# Patient Record
Sex: Female | Born: 1965 | Race: White | Hispanic: No | Marital: Married | Smoking: Never smoker
Health system: Southern US, Community
[De-identification: ages and names within clinical notes are randomized; demographics above are authoritative.]

## PROBLEM LIST (undated history)

## (undated) DIAGNOSIS — R17 Unspecified jaundice: Secondary | ICD-10-CM

## (undated) DIAGNOSIS — K9186 Retained cholelithiasis following cholecystectomy: Secondary | ICD-10-CM

## (undated) DIAGNOSIS — D649 Anemia, unspecified: Secondary | ICD-10-CM

## (undated) DIAGNOSIS — T4145XA Adverse effect of unspecified anesthetic, initial encounter: Secondary | ICD-10-CM

## (undated) DIAGNOSIS — R061 Stridor: Secondary | ICD-10-CM

## (undated) DIAGNOSIS — N920 Excessive and frequent menstruation with regular cycle: Secondary | ICD-10-CM

## (undated) DIAGNOSIS — K8020 Calculus of gallbladder without cholecystitis without obstruction: Secondary | ICD-10-CM

## (undated) DIAGNOSIS — J45909 Unspecified asthma, uncomplicated: Secondary | ICD-10-CM

## (undated) DIAGNOSIS — Z973 Presence of spectacles and contact lenses: Secondary | ICD-10-CM

## (undated) DIAGNOSIS — K219 Gastro-esophageal reflux disease without esophagitis: Secondary | ICD-10-CM

## (undated) DIAGNOSIS — Z9189 Other specified personal risk factors, not elsewhere classified: Secondary | ICD-10-CM

## (undated) DIAGNOSIS — T8859XA Other complications of anesthesia, initial encounter: Secondary | ICD-10-CM

## (undated) SURGERY — Surgical Case
Anesthesia: *Unknown

---

## 2001-10-20 ENCOUNTER — Other Ambulatory Visit: Admission: RE | Admit: 2001-10-20 | Discharge: 2001-10-20 | Payer: Self-pay | Admitting: *Deleted

## 2002-03-17 ENCOUNTER — Inpatient Hospital Stay (HOSPITAL_COMMUNITY): Admission: AD | Admit: 2002-03-17 | Discharge: 2002-03-19 | Payer: Self-pay | Admitting: *Deleted

## 2003-02-09 ENCOUNTER — Other Ambulatory Visit: Admission: RE | Admit: 2003-02-09 | Discharge: 2003-02-09 | Payer: Self-pay | Admitting: *Deleted

## 2005-06-11 ENCOUNTER — Emergency Department (HOSPITAL_COMMUNITY): Admission: EM | Admit: 2005-06-11 | Discharge: 2005-06-12 | Payer: Self-pay | Admitting: Emergency Medicine

## 2007-04-28 ENCOUNTER — Other Ambulatory Visit: Admission: RE | Admit: 2007-04-28 | Discharge: 2007-04-28 | Payer: Self-pay | Admitting: *Deleted

## 2013-02-12 ENCOUNTER — Encounter (HOSPITAL_COMMUNITY): Payer: Self-pay | Admitting: Emergency Medicine

## 2013-02-12 ENCOUNTER — Emergency Department (HOSPITAL_COMMUNITY)
Admission: EM | Admit: 2013-02-12 | Discharge: 2013-02-12 | Disposition: A | Discharge status: Home / Self Care | Payer: Self-pay | Attending: Emergency Medicine | Admitting: Emergency Medicine

## 2013-02-12 ENCOUNTER — Emergency Department (HOSPITAL_COMMUNITY): Payer: Self-pay

## 2013-02-12 DIAGNOSIS — Y9339 Activity, other involving climbing, rappelling and jumping off: Secondary | ICD-10-CM | POA: Insufficient documentation

## 2013-02-12 DIAGNOSIS — W098XXA Fall on or from other playground equipment, initial encounter: Secondary | ICD-10-CM | POA: Insufficient documentation

## 2013-02-12 DIAGNOSIS — Y9239 Other specified sports and athletic area as the place of occurrence of the external cause: Secondary | ICD-10-CM | POA: Insufficient documentation

## 2013-02-12 DIAGNOSIS — X500XXA Overexertion from strenuous movement or load, initial encounter: Secondary | ICD-10-CM | POA: Insufficient documentation

## 2013-02-12 DIAGNOSIS — S93409A Sprain of unspecified ligament of unspecified ankle, initial encounter: Secondary | ICD-10-CM | POA: Insufficient documentation

## 2013-02-12 DIAGNOSIS — J45909 Unspecified asthma, uncomplicated: Secondary | ICD-10-CM | POA: Insufficient documentation

## 2013-02-12 HISTORY — DX: Unspecified asthma, uncomplicated: J45.909

## 2013-02-12 MED ORDER — IBUPROFEN 600 MG PO TABS: 600.0000 mg | ORAL_TABLET | Freq: Four times a day (QID) | ORAL | Status: DC | PRN

## 2013-02-12 MED ORDER — IBUPROFEN 400 MG PO TABS
800.0000 mg | ORAL_TABLET | Freq: Once | ORAL | Status: AC
  Administered 2013-02-12: 800 mg via ORAL
  Filled 2013-02-12: qty 2

## 2013-02-12 NOTE — ED Provider Notes (Signed)
Medical screening examination/treatment/procedure(s) were performed by non-physician practitioner and as supervising physician I was immediately available for consultation/collaboration.  Juliet Rude. Rubin Payor, MD 02/12/13 2239

## 2013-02-12 NOTE — Progress Notes (Signed)
Orthopedic Tech Progress Note Patient Details:  Chelsea Cannon 01/25/1966 [id number]  Ortho Devices Type of Ortho Device: ASO;Crutches Ortho Device/Splint Location: LLE Ortho Device/Splint Interventions: Ordered;Application   [name] 02/12/2013, 4:50 PM

## 2013-02-12 NOTE — ED Provider Notes (Signed)
CSN: [id number]     Arrival date & time 02/12/13  1510 History  This chart was scribed for Marcos Eke, non-physician practitioner working with Juliet Rude. Rubin Payor, MD by Leone Payor, ED Scribe. This patient was seen in room TR09C/TR09C and the patient's care was started at 1510.    Chief Complaint  Patient presents with  . Ankle Pain    The history is provided by the patient. No language interpreter was used.    HPI Comments: Chelsea Cannon is a 47 y.o. female who presents to the Emergency Department complaining of constant, unchanged left ankle pain that started today. Pt states she was playing on a stand up see saw when she jumped off and landed on her left ankle. Pt denies rolling or twisting the affected ankle. She describes the pain as sharp and states the pain radiates from the bottom of heel up to the ankle. Pt has not taken any OTC medications prior to arriving. She denies numbness or weakness. Pt denies smoking and alcohol use.   Past Medical History  Diagnosis Date  . Asthma    History reviewed. No pertinent past surgical history. No family history on file. History  Substance Use Topics  . Smoking status: Never Smoker   . Smokeless tobacco: Not on file  . Alcohol Use: No   OB History   Grav Para Term Preterm Abortions TAB SAB Ect Mult Living                 Review of Systems  Musculoskeletal: Positive for arthralgias.  Skin: Negative for wound.  Neurological: Negative for numbness.  All other systems reviewed and are negative.   Allergies  Review of patient's allergies indicates no known allergies.  Home Medications   Current Outpatient Rx  Name  Route  Sig  Dispense  Refill  . Naproxen Sodium (ALEVE PO)   Oral   Take 1 tablet by mouth daily as needed (pain).         Marland Kitchen ibuprofen (ADVIL,MOTRIN) 600 MG tablet   Oral   Take 1 tablet (600 mg total) by mouth every 6 (six) hours as needed for pain.   30 tablet   0    BP 124/69  Pulse 82   Temp(Src) 98.2 F (36.8 C) (Oral)  Resp 16  Ht 5\' 4"  (1.626 m)  Wt 140 lb (63.504 kg)  BMI 24.02 kg/m2  SpO2 99%  LMP 02/04/2013  Physical Exam  Nursing note and vitals reviewed. Constitutional: She is oriented to person, place, and time. She appears well-developed and well-nourished. No distress.  HENT:  Head: Normocephalic and atraumatic.  Eyes: Conjunctivae and EOM are normal. No scleral icterus.  Neck: Normal range of motion.  Cardiovascular: Normal rate, regular rhythm and intact distal pulses.   2+ DP and PT pulses.   Pulmonary/Chest: Effort normal. No respiratory distress.  Musculoskeletal: Normal range of motion.       Left ankle: She exhibits normal range of motion, no swelling, no ecchymosis, no deformity and normal pulse. Tenderness. Achilles tendon normal.       Left lower leg: Normal.       Left foot: Normal.  Neurological: She is alert and oriented to person, place, and time. She has normal reflexes. She displays normal reflexes.  DTRs are symmetric and normal. Sensation in left lower extremity is intact.    Skin: Skin is warm and dry. No rash noted. She is not diaphoretic. No erythema. No pallor.  Cap refill normal.  Psychiatric: She has a normal mood and affect. Her behavior is normal.    ED Course   Procedures (including critical care time)  DIAGNOSTIC STUDIES: Oxygen Saturation is 99% on Ra, normal by my interpretation.    COORDINATION OF CARE: 4:22 PM Discussed treatment plan with pt at bedside and pt agreed to plan.   Labs Reviewed - No data to display Dg Ankle Complete Left  02/12/2013   *RADIOLOGY REPORT*  Clinical Data: Twisting injury to the left ankle.  Lateral pain.  LEFT ANKLE COMPLETE - 3+ VIEW  Comparison: None.  Findings: No evidence of acute fracture or dislocation.  Ankle mortise intact with well-preserved joint space.  Well-preserved bone mineral density.  No intrinsic osseous abnormality.  Probable joint effusion.  IMPRESSION: No osseous  abnormality.   Original Report Authenticated By: Hulan Saas, M.D.   1. Ankle sprain and strain, left, initial encounter    MDM  Uncomplicated left ankle sprain secondary to fall off seesaw. Patient is neurovascularly intact. Physical exam findings as above. There is no pallor, pulselessness, poikilothermia, or paresthesias to suspect complicating injury. No swelling, erythema, or heat-to-touch to suspect underlying cellulitic or infectious joint process. X-ray without evidence of fracture, dislocation, or other acute bony abnormality. ASO ankle applied in ED and crutches provided for weightbearing as tolerated. RICE instruction given and ibuprofen advised for pain. Patient appropriate for discharge with orthopedic referral should symptoms not improve with recommended treatments. Indications for ED return provided and patient agreeable to plan.  I personally performed the services described in this documentation, which was scribed in my presence. The recorded information has been reviewed and is accurate.    Antony Madura, PA-C 02/12/13 1629

## 2013-02-12 NOTE — ED Notes (Signed)
Pt reports that she was playing on a stand up see saw with her daughter and when she jumped off she landed on her left ankle wrong.

## 2016-03-20 ENCOUNTER — Encounter (HOSPITAL_COMMUNITY): Payer: Self-pay | Admitting: Emergency Medicine

## 2016-03-20 ENCOUNTER — Observation Stay (HOSPITAL_COMMUNITY): Payer: Medicaid Other

## 2016-03-20 ENCOUNTER — Inpatient Hospital Stay (HOSPITAL_COMMUNITY)
Admission: EM | Admit: 2016-03-20 | Discharge: 2016-03-24 | DRG: 419 | Disposition: A | Discharge status: Home / Self Care | Payer: Medicaid Other | Attending: Internal Medicine | Admitting: Internal Medicine

## 2016-03-20 ENCOUNTER — Emergency Department (HOSPITAL_COMMUNITY): Payer: Medicaid Other

## 2016-03-20 ENCOUNTER — Ambulatory Visit (HOSPITAL_COMMUNITY): Payer: Medicaid Other

## 2016-03-20 DIAGNOSIS — K8000 Calculus of gallbladder with acute cholecystitis without obstruction: Principal | ICD-10-CM | POA: Diagnosis present

## 2016-03-20 DIAGNOSIS — R1110 Vomiting, unspecified: Secondary | ICD-10-CM

## 2016-03-20 DIAGNOSIS — D1803 Hemangioma of intra-abdominal structures: Secondary | ICD-10-CM | POA: Diagnosis present

## 2016-03-20 DIAGNOSIS — R1013 Epigastric pain: Secondary | ICD-10-CM | POA: Diagnosis present

## 2016-03-20 DIAGNOSIS — K760 Fatty (change of) liver, not elsewhere classified: Secondary | ICD-10-CM | POA: Diagnosis present

## 2016-03-20 DIAGNOSIS — Z801 Family history of malignant neoplasm of trachea, bronchus and lung: Secondary | ICD-10-CM

## 2016-03-20 DIAGNOSIS — K8020 Calculus of gallbladder without cholecystitis without obstruction: Secondary | ICD-10-CM | POA: Diagnosis present

## 2016-03-20 DIAGNOSIS — Z8249 Family history of ischemic heart disease and other diseases of the circulatory system: Secondary | ICD-10-CM

## 2016-03-20 DIAGNOSIS — Z833 Family history of diabetes mellitus: Secondary | ICD-10-CM

## 2016-03-20 DIAGNOSIS — K838 Other specified diseases of biliary tract: Secondary | ICD-10-CM | POA: Diagnosis present

## 2016-03-20 DIAGNOSIS — D72829 Elevated white blood cell count, unspecified: Secondary | ICD-10-CM | POA: Diagnosis present

## 2016-03-20 DIAGNOSIS — R112 Nausea with vomiting, unspecified: Secondary | ICD-10-CM

## 2016-03-20 DIAGNOSIS — K819 Cholecystitis, unspecified: Secondary | ICD-10-CM

## 2016-03-20 DIAGNOSIS — K8021 Calculus of gallbladder without cholecystitis with obstruction: Secondary | ICD-10-CM

## 2016-03-20 LAB — I-STAT TROPONIN, ED
TROPONIN I, POC: 0 ng/mL (ref 0.00–0.08)
TROPONIN I, POC: 0 ng/mL (ref 0.00–0.08)

## 2016-03-20 LAB — COMPREHENSIVE METABOLIC PANEL
ALK PHOS: 100 U/L (ref 38–126)
ALT: 17 U/L (ref 14–54)
ANION GAP: 10 (ref 5–15)
AST: 25 U/L (ref 15–41)
Albumin: 4.3 g/dL (ref 3.5–5.0)
BUN: 12 mg/dL (ref 6–20)
CALCIUM: 9.5 mg/dL (ref 8.9–10.3)
CO2: 20 mmol/L — AB (ref 22–32)
Chloride: 105 mmol/L (ref 101–111)
Creatinine, Ser: 0.82 mg/dL (ref 0.44–1.00)
GFR calc non Af Amer: >60 mL/min (ref >60)
Glucose, Bld: 117 mg/dL — ABNORMAL HIGH (ref 65–99)
POTASSIUM: 4.7 mmol/L (ref 3.5–5.1)
SODIUM: 135 mmol/L (ref 135–145)
TOTAL PROTEIN: 7.9 g/dL (ref 6.5–8.1)
Total Bilirubin: 1.1 mg/dL (ref 0.3–1.2)

## 2016-03-20 LAB — LIPASE, BLOOD: Lipase: 16 U/L (ref 11–51)

## 2016-03-20 LAB — CBC
HCT: 41.6 % (ref 36.0–46.0)
Hemoglobin: 13.5 g/dL (ref 12.0–15.0)
MCH: 27.7 pg (ref 26.0–34.0)
MCHC: 32.5 g/dL (ref 30.0–36.0)
MCV: 85.2 fL (ref 78.0–100.0)
PLATELETS: 411 10*3/uL — AB (ref 150–400)
RBC: 4.88 MIL/uL (ref 3.87–5.11)
RDW: 13.5 % (ref 11.5–15.5)
WBC: 14.7 10*3/uL — AB (ref 4.0–10.5)

## 2016-03-20 LAB — POC URINE PREG, ED: PREG TEST UR: NEGATIVE

## 2016-03-20 MED ORDER — FAMOTIDINE IN NACL 20-0.9 MG/50ML-% IV SOLN
20.0000 mg | Freq: Two times a day (BID) | INTRAVENOUS | Status: DC
  Administered 2016-03-20 – 2016-03-22 (×5): 20 mg via INTRAVENOUS
  Filled 2016-03-20 (×7): qty 50

## 2016-03-20 MED ORDER — ALUM & MAG HYDROXIDE-SIMETH 200-200-20 MG/5ML PO SUSP
30.0000 mL | Freq: Once | ORAL | Status: AC
  Administered 2016-03-20: 30 mL via ORAL
  Filled 2016-03-20: qty 30

## 2016-03-20 MED ORDER — SODIUM CHLORIDE 0.9 % IV BOLUS (SEPSIS)
1000.0000 mL | Freq: Once | INTRAVENOUS | Status: AC
  Administered 2016-03-20: 1000 mL via INTRAVENOUS

## 2016-03-20 MED ORDER — PIPERACILLIN-TAZOBACTAM 3.375 G IVPB
3.3750 g | Freq: Three times a day (TID) | INTRAVENOUS | Status: DC
  Administered 2016-03-21 – 2016-03-23 (×8): 3.375 g via INTRAVENOUS
  Filled 2016-03-20 (×9): qty 50

## 2016-03-20 MED ORDER — ENOXAPARIN SODIUM 40 MG/0.4ML ~~LOC~~ SOLN
40.0000 mg | SUBCUTANEOUS | Status: DC
  Administered 2016-03-20: 40 mg via SUBCUTANEOUS
  Filled 2016-03-20: qty 0.4

## 2016-03-20 MED ORDER — PROMETHAZINE HCL 25 MG/ML IJ SOLN: 25.0000 mg | Freq: Four times a day (QID) | INTRAMUSCULAR | Status: DC | PRN

## 2016-03-20 MED ORDER — ONDANSETRON HCL 4 MG/2ML IJ SOLN
4.0000 mg | Freq: Four times a day (QID) | INTRAMUSCULAR | Status: DC | PRN
  Administered 2016-03-22: 4 mg via INTRAVENOUS
  Filled 2016-03-20: qty 2

## 2016-03-20 MED ORDER — METOCLOPRAMIDE HCL 5 MG/ML IJ SOLN
10.0000 mg | Freq: Once | INTRAMUSCULAR | Status: AC
  Administered 2016-03-20: 10 mg via INTRAVENOUS
  Filled 2016-03-20: qty 2

## 2016-03-20 MED ORDER — ACETAMINOPHEN 650 MG RE SUPP: 650.0000 mg | Freq: Four times a day (QID) | RECTAL | Status: DC | PRN

## 2016-03-20 MED ORDER — GADOBENATE DIMEGLUMINE 529 MG/ML IV SOLN
14.0000 mL | Freq: Once | INTRAVENOUS | Status: AC | PRN
  Administered 2016-03-20: 14 mL via INTRAVENOUS

## 2016-03-20 MED ORDER — PIPERACILLIN-TAZOBACTAM 3.375 G IVPB 30 MIN
3.3750 g | Freq: Once | INTRAVENOUS | Status: AC
  Administered 2016-03-20: 3.375 g via INTRAVENOUS
  Filled 2016-03-20: qty 50

## 2016-03-20 MED ORDER — KETOROLAC TROMETHAMINE 15 MG/ML IJ SOLN
15.0000 mg | Freq: Four times a day (QID) | INTRAMUSCULAR | Status: DC | PRN
  Administered 2016-03-21 – 2016-03-22 (×3): 15 mg via INTRAVENOUS
  Filled 2016-03-20 (×3): qty 1

## 2016-03-20 MED ORDER — ACETAMINOPHEN 325 MG PO TABS
650.0000 mg | ORAL_TABLET | Freq: Four times a day (QID) | ORAL | Status: DC | PRN
  Administered 2016-03-23: 650 mg via ORAL
  Filled 2016-03-20: qty 2

## 2016-03-20 MED ORDER — MORPHINE SULFATE (PF) 2 MG/ML IV SOLN: 1.0000 mg | INTRAVENOUS | Status: DC | PRN

## 2016-03-20 MED ORDER — SODIUM CHLORIDE 0.9 % IV BOLUS (SEPSIS)
500.0000 mL | Freq: Once | INTRAVENOUS | Status: AC
  Administered 2016-03-20: 500 mL via INTRAVENOUS

## 2016-03-20 MED ORDER — FENTANYL CITRATE (PF) 100 MCG/2ML IJ SOLN
50.0000 ug | Freq: Once | INTRAMUSCULAR | Status: AC
  Administered 2016-03-20: 50 ug via INTRAVENOUS
  Filled 2016-03-20: qty 2

## 2016-03-20 MED ORDER — ONDANSETRON HCL 4 MG/2ML IJ SOLN
4.0000 mg | Freq: Once | INTRAMUSCULAR | Status: AC
  Administered 2016-03-20: 4 mg via INTRAVENOUS
  Filled 2016-03-20: qty 2

## 2016-03-20 MED ORDER — SODIUM CHLORIDE 0.9 % IV SOLN
INTRAVENOUS | Status: DC
  Administered 2016-03-20 – 2016-03-23 (×4): via INTRAVENOUS

## 2016-03-20 NOTE — ED Notes (Signed)
MD at bedside. 

## 2016-03-20 NOTE — ED Notes (Signed)
Patient transported to MRI 

## 2016-03-20 NOTE — ED Notes (Signed)
Patient transported to Ultrasound 

## 2016-03-20 NOTE — ED Notes (Signed)
Phlebotomy at bedside.

## 2016-03-20 NOTE — ED Triage Notes (Signed)
Pt reports left sided chest pain that began Wednesday morning at 130 AM. Pt reports radiation of pain and diaophoresis with the pain. Denies history of heart disease. VSS.

## 2016-03-20 NOTE — ED Notes (Signed)
IV attempted will re-attempt.

## 2016-03-20 NOTE — ED Notes (Signed)
Patient transported to X-ray 

## 2016-03-20 NOTE — H&P (Signed)
History and Physical    Chelsea Cannon [id number] DOB: [birth year] DOA: 03/20/2016   PCP: No PCP Per Patient / UNASSIGNED  Patient coming from/Resides with: Private residence/this with husband and children  Admission status: Observation/medical floor  Chief Complaint: Epigastric pain  HPI: Chelsea Cannon is a 49 y.o. female without any significant medical history. Patient reports that for the past several days she has noted epigastric pain with eating which worsened yesterday. She felt like these symptoms represented very severe indigestion. Because of this she was afraid to eat. She awakened at 1:30 AM this morning with severe epigastric pain radiating to her left back and shoulder blade region. This pain was constant and very severe in nature level 9-10/10. This was associated with significant nausea and recurrent emesis. She has not noticed any dark urine. She had noticed increase in abdominal pain with eating over the past several days. Patient currently reporting very minimal to 0 abdominal pain.  ED Course:  Vital Signs: BP 121/71 (BP Location: Right Arm)   Pulse 67   Temp 98.7 F (37.1 C) (Oral)   Resp 21   LMP 02/28/2016   SpO2 97%  2 view CXR: No active cardiopulmonary disease Ultrasound abdomen Limited RUQ: Dilated common bile duct measuring 13.9 cm, assessment, and bile duct limited due to shadowing from overlying gallstones (also associated gallbladder sludge) therefore unable to exclude a distal common bile duct calculus, stricture or mass-MRCP recommended to evaluate further; incidental finding of mild fatty infiltration of the liver Lab data: Sodium 135, potassium 4.7, CO2 20, BUN 12, creatinine 0.82, glucose 117, LFTs are normal, poc troponin negative 2, white count 14,700 differential not obtained, hemoglobin 13.5, platelets 411,000, urine pregnancy test negative Medications and treatments: Mylanta 30 mL 1, normal saline bolus 1500 mL, Zofran 4 mg IV 1,  fentanyl 50 g IV 1  Review of Systems:  In addition to the HPI above,  No Fever-chills, myalgias or other constitutional symptoms No Headache, changes with Vision or hearing, new weakness, tingling, numbness in any extremity, dizziness, dysarthria or word finding difficulty, gait disturbance or imbalance, tremors or seizure activity No problems swallowing food or Liquids, choking or coughing while eating No Chest pain, Cough or Shortness of Breath, palpitations, orthopnea or DOE No melena,hematochezia, dark tarry stools, constipation No dysuria, malodorous urine, hematuria or flank pain No new skin rashes, lesions, masses or bruises, No new joint pains, aches, swelling or redness No recent unintentional weight gain or loss No polyuria, polydypsia or polyphagia   Past Medical History:  Diagnosis Date  . Asthma     History reviewed. No pertinent surgical history.  Social History   Social History  . Marital status: Married    Spouse name: N/A  . Number of children: N/A  . Years of education: N/A   Occupational History  . Not on file.   Social History Main Topics  . Smoking status: Never Smoker  . Smokeless tobacco: Not on file  . Alcohol use No  . Drug use: No  . Sexual activity: Not on file   Other Topics Concern  . Not on file   Social History Narrative  . No narrative on file    Mobility: Without assistive devices Work history: Housewife   No Known Allergies  Family History:  Father: Diabetes mellitus, hypertension, CAD and lung cancer   Prior to Admission medications   Medication Sig Start Date End Date Taking? Authorizing Provider  Famotidine (PEPCID PO) Take 2 tablets by mouth  daily as needed (heartburn).   Yes Historical Provider, MD  ranitidine (ZANTAC) 150 MG tablet Take 150 mg by mouth daily as needed for heartburn.   Yes Historical Provider, MD  ibuprofen (ADVIL,MOTRIN) 600 MG tablet Take 1 tablet (600 mg total) by mouth every 6 (six) hours as  needed for pain. Patient not taking: Reported on 03/20/2016 02/12/13   Antonietta Breach, PA-C    Physical Exam: Vitals:   03/20/16 1200 03/20/16 1430 03/20/16 1609 03/20/16 1643  BP: 122/75 110/72 125/65 121/71  Pulse: 86 75 84 67  Resp: 20 17 15 21   Temp:      TempSrc:      SpO2: 98% 100% 98% 97%      Constitutional: NAD, calm, comfortable Eyes: PERRL, lids and conjunctivae normal ENMT: Mucous membranes are moist. Posterior pharynx clear of any exudate or lesions.Normal dentition.  Neck: normal, supple, no masses, no thyromegaly Respiratory: clear to auscultation bilaterally, no wheezing, no crackles. Normal respiratory effort. No accessory muscle use.  Cardiovascular: Regular rate and rhythm, no murmurs / rubs / gallops. No extremity edema. 2+ pedal pulses. No carotid bruits.  Abdomen: Minimally tender over epigastrium, no masses palpated. No hepatosplenomegaly. Bowel sounds positive.  Musculoskeletal: no clubbing / cyanosis. No joint deformity upper and lower extremities. Good ROM, no contractures. Normal muscle tone.  Skin: no rashes, lesions, ulcers. No induration Neurologic: CN 2-12 grossly intact. Sensation intact, DTR normal. Strength 5/5 x all 4 extremities.  Psychiatric: Normal judgment and insight. Alert and oriented x 3. Normal mood.    Labs on Admission: I have personally reviewed following labs and imaging studies  CBC:  Recent Labs Lab 03/20/16 1038  WBC 14.7*  HGB 13.5  HCT 41.6  MCV 85.2  PLT 123456*   Basic Metabolic Panel:  Recent Labs Lab 03/20/16 1028  NA 135  K 4.7  CL 105  CO2 20*  GLUCOSE 117*  BUN 12  CREATININE 0.82  CALCIUM 9.5   GFR: CrCl cannot be calculated (Unknown ideal weight.). Liver Function Tests:  Recent Labs Lab 03/20/16 1028  AST 25  ALT 17  ALKPHOS 100  BILITOT 1.1  PROT 7.9  ALBUMIN 4.3    Recent Labs Lab 03/20/16 1028  LIPASE 16   No results for input(s): AMMONIA in the last 168 hours. Coagulation  Profile: No results for input(s): INR, PROTIME in the last 168 hours. Cardiac Enzymes: No results for input(s): CKTOTAL, CKMB, CKMBINDEX, TROPONINI in the last 168 hours. BNP (last 3 results) No results for input(s): PROBNP in the last 8760 hours. HbA1C: No results for input(s): HGBA1C in the last 72 hours. CBG: No results for input(s): GLUCAP in the last 168 hours. Lipid Profile: No results for input(s): CHOL, HDL, LDLCALC, TRIG, CHOLHDL, LDLDIRECT in the last 72 hours. Thyroid Function Tests: No results for input(s): TSH, T4TOTAL, FREET4, T3FREE, THYROIDAB in the last 72 hours. Anemia Panel: No results for input(s): VITAMINB12, FOLATE, FERRITIN, TIBC, IRON, RETICCTPCT in the last 72 hours. Urine analysis: No results found for: COLORURINE, APPEARANCEUR, LABSPEC, PHURINE, GLUCOSEU, HGBUR, BILIRUBINUR, KETONESUR, PROTEINUR, UROBILINOGEN, NITRITE, LEUKOCYTESUR Sepsis Labs: @LABRCNTIP (procalcitonin:4,lacticidven:4) )No results found for this or any previous visit (from the past 240 hour(s)).   Radiological Exams on Admission: Dg Chest 2 View  Result Date: 03/20/2016 CLINICAL DATA:  Chest pain since this morning. EXAM: CHEST  2 VIEW COMPARISON:  None. FINDINGS: The heart size and mediastinal contours are within normal limits. Both lungs are clear. The visualized skeletal structures are unremarkable. IMPRESSION: No  active cardiopulmonary disease. Electronically Signed   By: Marin Olp M.D.   On: 03/20/2016 11:13   US Abdomen Limited Ruq  Result Date: 03/20/2016 CLINICAL DATA:  Epigastric pain EXAM: US ABDOMEN LIMITED - RIGHT UPPER QUADRANT COMPARISON:  None. FINDINGS: Gallbladder: The gallbladder is well seen and there are gallstones present the largest of 1.4 cm with some gallbladder sludge present as well. There is no pain over the gallbladder with compression. Common bile duct: Diameter: The common bile duct is dilated measuring 13.9 cm. Assessment of the common bile duct is limited due  to shadowing from overlying gallstones. A distal common bowel duct calculus, stricture, or mass is suspected in view of the dilatation of the common bile duct. Consider CT or preferably MR with MRCP to assess further. Liver: The liver somewhat echogenic suggesting mild fatty infiltration. No focal hepatic abnormality is seen. IMPRESSION: 1. Dilated common bile duct. Consider distal common bile duct calculus, stricture, or mass. Recommend CT or preferably MRI with MRCP to evaluate further. 2. Gallstones with gallbladder sludge. 3. Suspect mild fatty infiltration of the liver. Electronically Signed   By: Ivar Drape M.D.   On: 03/20/2016 16:17    EKG: (Independently reviewed) normal sinus rhythm with ventricular rate 84 bpm, QTC 450 ms, no ischemic changes  Assessment/Plan Principal Problem:   Common bile duct dilatation -History appears consistent with passage of large gallstone but needs MRCP to better clarify ie gallstones or stricture versus mass -Await GI recommendations -LFTs and lipase currently normal -NPO (icechips only) IV fluids until after MRCP and after GI has evaluated  Active Problems:   Acute epigastric pain/Cholelithiasis without cholecystitis -Multiple gallstones with sludge without evidence of acute cholecystitis on ultrasound -Symptom management with IV Toradol and for severe pain IV morphine -IV Pepcid while giving IV Toradol -Follow labs  -Will need eventual cholecystectomy    Fatty liver disease, nonalcoholic -Incidental finding on abdominal ultrasound and very mild in nature -Current LFTs are normal    Leukocytosis -Currently without fever but given possibility of recent passage of common bile duct stone will treat empirically for gram-negative infection with IV Zosyn -Follow labs      DVT prophylaxis: Lovenox  Code Status: Full Family Communication: Teenage daughter at bedside  Disposition Plan: Anticipate discharge back to preadmission home in [place] once  medically stable Consults called: Gastroenterology on-call per EDP Dr. Leonette Monarch [facility] on unassigned call for 9/28)    Carlis Burnsworth L. ANP-BC [facility] Pager [phone]   If 7PM-7AM, please contact night-coverage [email or web address] Password [email or web address]  03/20/2016, 5:10 PM

## 2016-03-20 NOTE — Progress Notes (Signed)
Pharmacy Antibiotic Note  Chelsea Cannon is a 50 y.o. female admitted on 03/20/2016 with possible intra-abdominal infection. Patient reporting abdominal pain with nausea and vomiting. Gallstones on ultrasound per notes. Pharmacy has been consulted for Zosyn dosing. Afebrile and WBC elevated at 14.7.   Plan: Zosyn 3.375g IV q8h (4 hour infusion). Monitor renal function, clinical picture, and culture results.  Follow-up antibiotic duration of therapy.   Temp (24hrs), Avg:98.7 F (37.1 C), Min:98.7 F (37.1 C), Max:98.7 F (37.1 C)   Recent Labs Lab 03/20/16 1028 03/20/16 1038  WBC  --  14.7*  CREATININE 0.82  --     CrCl ~ 80-90 mL/min   No Known Allergies  Antimicrobials this admission: 9/28 Zosyn >>   Dose adjustments this admission: N/A   Microbiology results: pending   Thank you for allowing pharmacy to be a part of this patient's care.  Argie Ramming, PharmD Pharmacy Resident  Pager [phone] 03/20/16 5:46 PM

## 2016-03-20 NOTE — ED Notes (Signed)
Lab at bedside

## 2016-03-20 NOTE — ED Provider Notes (Signed)
[facility] DEPT Provider Note   CSN: [id number] Arrival date & time: 03/20/16  [id number]     History   Chief Complaint Chief Complaint  Patient presents with  . Chest Pain    HPI Chelsea Cannon is a 50 y.o. female.  The history is provided by the patient.  Chest Pain   This is a recurrent problem. The current episode started yesterday. The problem occurs constantly. The problem has been gradually worsening. The pain is present in the epigastric region and lateral region. The pain is moderate. The quality of the pain is described as sharp. The pain radiates to the left shoulder and left arm. Associated symptoms include cough, nausea and vomiting. Pertinent negatives include no abdominal pain, no back pain, no fever, no lower extremity edema, no palpitations and no shortness of breath. She has tried antacids for the symptoms. The treatment provided mild relief.  Pertinent negatives for past medical history include no CAD, no diabetes, no DVT, no hyperlipidemia, no hypertension, no MI, no PE and no seizures.  Procedure history is negative for cardiac catheterization and exercise treadmill test.    Past Medical History:  Diagnosis Date  . Asthma     Patient Active Problem List   Diagnosis Date Noted  . Cholelithiasis without cholecystitis 03/20/2016  . Acute epigastric pain 03/20/2016  . Common bile duct dilatation 03/20/2016  . Fatty liver disease, nonalcoholic 0000000  . Leukocytosis 03/20/2016    History reviewed. No pertinent surgical history.  OB History    No data available       Home Medications    Prior to Admission medications   Medication Sig Start Date End Date Taking? Authorizing Provider  Famotidine (PEPCID PO) Take 2 tablets by mouth daily as needed (heartburn).   Yes Historical Provider, MD  ranitidine (ZANTAC) 150 MG tablet Take 150 mg by mouth daily as needed for heartburn.   Yes Historical Provider, MD  ibuprofen (ADVIL,MOTRIN) 600 MG tablet  Take 1 tablet (600 mg total) by mouth every 6 (six) hours as needed for pain. Patient not taking: Reported on 03/20/2016 02/12/13   Antonietta Breach, PA-C    Family History History reviewed. No pertinent family history.  Social History Social History  Substance Use Topics  . Smoking status: Never Smoker  . Smokeless tobacco: Not on file  . Alcohol use No     Allergies   Review of patient's allergies indicates no known allergies.   Review of Systems Review of Systems  Constitutional: Negative for chills and fever.  HENT: Negative for ear pain and sore throat.   Eyes: Negative for pain and visual disturbance.  Respiratory: Positive for cough. Negative for shortness of breath.   Cardiovascular: Positive for chest pain. Negative for palpitations.  Gastrointestinal: Positive for nausea and vomiting. Negative for abdominal pain.  Genitourinary: Negative for dysuria and hematuria.  Musculoskeletal: Negative for arthralgias and back pain.  Skin: Negative for color change and rash.  Neurological: Negative for seizures and syncope.  All other systems reviewed and are negative.    Physical Exam Updated Vital Signs BP 147/79 (BP Location: Right Arm)   Pulse 84   Temp 98.7 F (37.1 C) (Oral)   Resp 16   LMP 02/28/2016   SpO2 100%   Physical Exam  Constitutional: She is oriented to person, place, and time. She appears well-developed and well-nourished. No distress.  HENT:  Head: Normocephalic and atraumatic.  Nose: Nose normal.  Eyes: Conjunctivae and EOM are normal. Pupils are  equal, round, and reactive to light. Right eye exhibits no discharge. Left eye exhibits no discharge. No scleral icterus.  Neck: Normal range of motion. Neck supple.  Cardiovascular: Normal rate and regular rhythm.  Exam reveals no gallop and no friction rub.   No murmur heard. Pulmonary/Chest: Effort normal and breath sounds normal. No stridor. No respiratory distress. She has no rales.  Abdominal: Soft.  She exhibits no distension. There is tenderness in the epigastric area.  Musculoskeletal: She exhibits no edema or tenderness.  Neurological: She is alert and oriented to person, place, and time.  Skin: Skin is warm and dry. No rash noted. She is not diaphoretic. No erythema.  Psychiatric: She has a normal mood and affect.  Vitals reviewed.    ED Treatments / Results  Labs (all labs ordered are listed, but only abnormal results are displayed) Labs Reviewed  CBC - Abnormal; Notable for the following:       Result Value   WBC 14.7 (*)    Platelets 411 (*)    All other components within normal limits  COMPREHENSIVE METABOLIC PANEL - Abnormal; Notable for the following:    CO2 20 (*)    Glucose, Bld 117 (*)    All other components within normal limits  LIPASE, BLOOD  I-STAT TROPOININ, ED  POC URINE PREG, ED  I-STAT TROPOININ, ED    EKG  EKG Interpretation  Date/Time:  Thursday March 20 2016 09:56:19 EDT Ventricular Rate:  84 PR Interval:  140 QRS Duration: 74 QT Interval:  352 QTC Calculation: 415 R Axis:   59 Text Interpretation:  Normal sinus rhythm Right atrial enlargement Abnormal ECG Confirmed by [employer] MD, PEDRO ([id number]) on 03/20/2016 10:24:23 AM       Radiology Dg Chest 2 View  Result Date: 03/20/2016 CLINICAL DATA:  Chest pain since this morning. EXAM: CHEST  2 VIEW COMPARISON:  None. FINDINGS: The heart size and mediastinal contours are within normal limits. Both lungs are clear. The visualized skeletal structures are unremarkable. IMPRESSION: No active cardiopulmonary disease. Electronically Signed   By: Marin Olp M.D.   On: 03/20/2016 11:13   US Abdomen Limited Ruq  Result Date: 03/20/2016 CLINICAL DATA:  Epigastric pain EXAM: US ABDOMEN LIMITED - RIGHT UPPER QUADRANT COMPARISON:  None. FINDINGS: Gallbladder: The gallbladder is well seen and there are gallstones present the largest of 1.4 cm with some gallbladder sludge present as well. There is no pain  over the gallbladder with compression. Common bile duct: Diameter: The common bile duct is dilated measuring 13.9 cm. Assessment of the common bile duct is limited due to shadowing from overlying gallstones. A distal common bowel duct calculus, stricture, or mass is suspected in view of the dilatation of the common bile duct. Consider CT or preferably MR with MRCP to assess further. Liver: The liver somewhat echogenic suggesting mild fatty infiltration. No focal hepatic abnormality is seen. IMPRESSION: 1. Dilated common bile duct. Consider distal common bile duct calculus, stricture, or mass. Recommend CT or preferably MRI with MRCP to evaluate further. 2. Gallstones with gallbladder sludge. 3. Suspect mild fatty infiltration of the liver. Electronically Signed   By: Ivar Drape M.D.   On: 03/20/2016 16:17    Procedures Procedures (including critical care time)   EMERGENCY DEPARTMENT BILIARY ULTRASOUND INTERPRETATION "Study: Limited Abdominal Ultrasound of the gallbladder and common bile duct."  INDICATIONS: Nausea and Vomiting Indication: Multiple views of the gallbladder and common bile duct were obtained in real-time with a Multi-frequency probe." PERFORMED  BY:  Myself IMAGES ARCHIVED?: Yes FINDINGS: Gallstones present, Gallbladder wall normal in thickness and Sonographic Murphy's sign absent LIMITATIONS: Bowel Gas INTERPRETATION: Cholelithiasis  CPT Code [phone] (limited abdominal)  Medications Ordered in ED Medications  0.9 %  sodium chloride infusion (not administered)  ondansetron (ZOFRAN) injection 4 mg (not administered)    Or  promethazine (PHENERGAN) injection 25 mg (not administered)  ketorolac (TORADOL) 15 MG/ML injection 15 mg (not administered)  morphine 2 MG/ML injection 1-4 mg (not administered)  famotidine (PEPCID) IVPB 20 mg premix (not administered)  piperacillin-tazobactam (ZOSYN) IVPB 3.375 g (not administered)  alum & mag hydroxide-simeth (MAALOX/MYLANTA)  200-200-20 MG/5ML suspension 30 mL (30 mLs Oral Given 03/20/16 1044)  sodium chloride 0.9 % bolus 1,000 mL (0 mLs Intravenous Stopped 03/20/16 1443)  ondansetron (ZOFRAN) injection 4 mg (4 mg Intravenous Given 03/20/16 1300)  metoCLOPramide (REGLAN) injection 10 mg (10 mg Intravenous Given 03/20/16 1447)  sodium chloride 0.9 % bolus 500 mL (0 mLs Intravenous Stopped 03/20/16 1659)  fentaNYL (SUBLIMAZE) injection 50 mcg (50 mcg Intravenous Given 03/20/16 1607)     Initial Impression / Assessment and Plan / ED Course  I have reviewed the triage vital signs and the nursing notes.  Pertinent labs & imaging results that were available during my care of the patient were reviewed by me and considered in my medical decision making (see chart for details).  Clinical Course    Atypical chest pain. EKG without acute ischemic changes. HEAR <4. Feels she is appropriate for delta trop to rule out ACS. Trop negative x2. Low suspicion for pulmonary embolism. Chest x-ray without evidence suggestive of pneumonia, pneumothorax, pneumomediastinum.  No abnormal contour of the mediastinum to suggest dissection. No evidence of acute injuries.Presentation is not classic for dissection. History not suggestive of perforated esophagus.  Patient provided with IV fluids and anti-medics, but continues to be intractable nausea and vomiting. Given additional Reglan. Beside ultrasound revealed numerous gallstones. No evidence suggesting acute cholecystitis however patient does have leukocytosis. We'll send for formal ultrasound.  Ultrasound corroborated evidence of gallstones. And noted a dilated common bile duct without obvious choledocholithiasis. Recommended further evaluation with MRCP. Will discuss case with gastroenterology.  Patient admitted to the hospitalist service for continued workup and management.  Final Clinical Impressions(s) / ED Diagnoses   Final diagnoses:  Epigastric pain  Calculus of gallbladder with  biliary obstruction but without cholecystitis  Intractable vomiting with nausea, vomiting of unspecified type      Fatima Blank, MD 03/20/16 [phone]

## 2016-03-21 DIAGNOSIS — K8000 Calculus of gallbladder with acute cholecystitis without obstruction: Principal | ICD-10-CM

## 2016-03-21 DIAGNOSIS — K8020 Calculus of gallbladder without cholecystitis without obstruction: Secondary | ICD-10-CM | POA: Diagnosis present

## 2016-03-21 DIAGNOSIS — D1803 Hemangioma of intra-abdominal structures: Secondary | ICD-10-CM | POA: Diagnosis present

## 2016-03-21 DIAGNOSIS — R1013 Epigastric pain: Secondary | ICD-10-CM | POA: Diagnosis present

## 2016-03-21 DIAGNOSIS — Z833 Family history of diabetes mellitus: Secondary | ICD-10-CM | POA: Diagnosis not present

## 2016-03-21 DIAGNOSIS — K760 Fatty (change of) liver, not elsewhere classified: Secondary | ICD-10-CM | POA: Diagnosis present

## 2016-03-21 DIAGNOSIS — Z8249 Family history of ischemic heart disease and other diseases of the circulatory system: Secondary | ICD-10-CM | POA: Diagnosis not present

## 2016-03-21 DIAGNOSIS — Z801 Family history of malignant neoplasm of trachea, bronchus and lung: Secondary | ICD-10-CM | POA: Diagnosis not present

## 2016-03-21 DIAGNOSIS — K838 Other specified diseases of biliary tract: Secondary | ICD-10-CM

## 2016-03-21 LAB — COMPREHENSIVE METABOLIC PANEL
ALK PHOS: 85 U/L (ref 38–126)
ALT: 14 U/L (ref 14–54)
ANION GAP: 6 (ref 5–15)
AST: 17 U/L (ref 15–41)
Albumin: 3.6 g/dL (ref 3.5–5.0)
BUN: 10 mg/dL (ref 6–20)
CALCIUM: 8.9 mg/dL (ref 8.9–10.3)
CO2: 23 mmol/L (ref 22–32)
CREATININE: 0.8 mg/dL (ref 0.44–1.00)
Chloride: 108 mmol/L (ref 101–111)
Glucose, Bld: 117 mg/dL — ABNORMAL HIGH (ref 65–99)
Potassium: 3.5 mmol/L (ref 3.5–5.1)
Sodium: 137 mmol/L (ref 135–145)
TOTAL PROTEIN: 6.8 g/dL (ref 6.5–8.1)
Total Bilirubin: 1.5 mg/dL — ABNORMAL HIGH (ref 0.3–1.2)

## 2016-03-21 LAB — CBC
HCT: 38.9 % (ref 36.0–46.0)
HEMOGLOBIN: 12.5 g/dL (ref 12.0–15.0)
MCH: 27.2 pg (ref 26.0–34.0)
MCHC: 32.1 g/dL (ref 30.0–36.0)
MCV: 84.6 fL (ref 78.0–100.0)
PLATELETS: 384 10*3/uL (ref 150–400)
RBC: 4.6 MIL/uL (ref 3.87–5.11)
RDW: 13.6 % (ref 11.5–15.5)
WBC: 15.9 10*3/uL — ABNORMAL HIGH (ref 4.0–10.5)

## 2016-03-21 LAB — LIPASE, BLOOD: LIPASE: 18 U/L (ref 11–51)

## 2016-03-21 NOTE — Progress Notes (Signed)
PROGRESS NOTE    Chelsea Cannon  [id number] DOB: 1965/12/06 DOA: 03/20/2016 PCP: No PCP Per Patient   Brief Narrative:  Chelsea Cannon with no stenotic and past medical history presented to the emergency department on 03/12/2016 with complaints of right upper quadrant pain. Initial workup in the emergency room included a right upper quadrant ultrasound that showed dilated common bile duct measuring 13.9 cm. This was further worked up with an MRCP that did not reveal evidence of choledocholithiasis, will ever did reveal the presence of gallstones and possibly early cholecystitis. General surgery was consulted.    Assessment & Plan:   Principal Problem:   Common bile duct dilatation Active Problems:   Cholelithiasis without cholecystitis   Acute epigastric pain   Fatty liver disease, nonalcoholic   Leukocytosis   Cholelithiasis   1.  Possible early cholecystitis -Patient presenting with complaints of right upper quadrant pain with ultrasound showing common bile duct dilatation. This was further worked up with MRCP that did not reveal evidence of choledocholithiasis. Suspect passed stone -Lab showing elevated white count of 15,900, total bili 1.5, alk phos 85.  -Continue empiric IV antimicrobial therapy with Zosyn -Case was discussed with general surgery will consult for possible laparoscopic cholecystectomy   DVT prophylaxis: SCD's Code Status: Full Code Family Communication: I spoke with multiple family members at bedside Disposition Plan: Plan for [facility] in am  Consultants:   General Surgery  Procedures:     Antimicrobials:   Zosyn   Subjective: She states felling a little better today  Objective: Vitals:   03/20/16 1922 03/20/16 2117 03/21/16 0610 03/21/16 1451  BP: 128/60 120/64 103/67 (!) 119/57  Pulse: 73 89 81 85  Resp: '18 19 18 20  ' Temp: 99.2 F (37.3 C) 99 F (37.2 C) 98.9 F (37.2 C) 98.3 F (36.8 C)    TempSrc: Oral Oral Oral Oral  SpO2: 98% 99% 100% 99%  Weight: 70.8 kg (156 lb 1.4 oz)     Height: '5\' 4"'  (1.626 m)       Intake/Output Summary (Last 24 hours) at 03/21/16 1704 Last data filed at 03/21/16 0656  Gross per 24 hour  Intake              100 ml  Output                0 ml  Net              100 ml   Filed Weights   03/20/16 1922  Weight: 70.8 kg (156 lb 1.4 oz)    Examination:  General exam: Appears calm and comfortable  Respiratory system: Clear to auscultation. Respiratory effort normal. Cardiovascular system: S1 & S2 heard, RRR. No JVD, murmurs, rubs, gallops or clicks. No pedal edema. Gastrointestinal system: Abdomen is nondistended, soft and nontender. No organomegaly or masses felt. Normal bowel sounds heard. Central nervous system: Alert and oriented. No focal neurological deficits. Extremities: Symmetric 5 x 5 power. Skin: No rashes, lesions or ulcers Psychiatry: Judgement and insight appear normal. Mood & affect appropriate.     Data Reviewed: I have personally reviewed following labs and imaging studies  CBC:  Recent Labs Lab 03/20/16 1038 03/21/16 0536  WBC 14.7* 15.9*  HGB 13.5 12.5  HCT 41.6 38.9  MCV 85.2 84.6  PLT 411* 229   Basic Metabolic Panel:  Recent Labs Lab 03/20/16 1028 03/21/16 0536  NA 135 137  K 4.7 3.5  CL 105  108  CO2 20* 23  GLUCOSE 117* 117*  BUN 12 10  CREATININE 0.82 0.80  CALCIUM 9.5 8.9   GFR: Estimated Creatinine Clearance: 81.1 mL/min (by C-G formula based on SCr of 0.8 mg/dL). Liver Function Tests:  Recent Labs Lab 03/20/16 1028 03/21/16 0536  AST 25 17  ALT 17 14  ALKPHOS 100 85  BILITOT 1.1 1.5*  PROT 7.9 6.8  ALBUMIN 4.3 3.6    Recent Labs Lab 03/20/16 1028 03/21/16 0536  LIPASE 16 18   No results for input(s): AMMONIA in the last 168 hours. Coagulation Profile: No results for input(s): INR, PROTIME in the last 168 hours. Cardiac Enzymes: No results for input(s): CKTOTAL, CKMB,  CKMBINDEX, TROPONINI in the last 168 hours. BNP (last 3 results) No results for input(s): PROBNP in the last 8760 hours. HbA1C: No results for input(s): HGBA1C in the last 72 hours. CBG: No results for input(s): GLUCAP in the last 168 hours. Lipid Profile: No results for input(s): CHOL, HDL, LDLCALC, TRIG, CHOLHDL, LDLDIRECT in the last 72 hours. Thyroid Function Tests: No results for input(s): TSH, T4TOTAL, FREET4, T3FREE, THYROIDAB in the last 72 hours. Anemia Panel: No results for input(s): VITAMINB12, FOLATE, FERRITIN, TIBC, IRON, RETICCTPCT in the last 72 hours. Sepsis Labs: No results for input(s): PROCALCITON, LATICACIDVEN in the last 168 hours.  No results found for this or any previous visit (from the past 240 hour(s)).       Radiology Studies: Dg Chest 2 View  Result Date: 03/20/2016 CLINICAL DATA:  Chest pain since this morning. EXAM: CHEST  2 VIEW COMPARISON:  None. FINDINGS: The heart size and mediastinal contours are within normal limits. Both lungs are clear. The visualized skeletal structures are unremarkable. IMPRESSION: No active cardiopulmonary disease. Electronically Signed   By: Marin Olp M.D.   On: 03/20/2016 11:13   Mr 3d Recon At Scanner  Result Date: 03/20/2016 CLINICAL DATA:  Evaluate common bile duct dilatation. Epigastric pain. EXAM: MRI ABDOMEN WITHOUT AND WITH CONTRAST (INCLUDING MRCP) TECHNIQUE: Multiplanar multisequence MR imaging of the abdomen was performed both before and after the administration of intravenous contrast. Heavily T2-weighted images of the biliary and pancreatic ducts were obtained, and three-dimensional MRCP images were rendered by post processing. CONTRAST:  40m MULTIHANCE GADOBENATE DIMEGLUMINE 529 MG/ML IV SOLN COMPARISON:  None. FINDINGS: Lower chest: No acute findings. Hepatobiliary: Mild hepatic steatosis. 9 mm enhancing lesion within the posterior right lobe of liver is identified, image 42 of series 16004. Likely benign  hemangioma. Within the lateral right lobe of liver there is a hemangioma measuring 1.2 cm, image 37 of series 1604. No intrahepatic bile duct dilatation. Numerous stones are identified within the gallbladder. These measure up to 6 mm. Mild wall thickening of the gallbladder. Within the cystic duct there is a signal void measuring 7 mm, image 20 of series 9. The common bile duct measures 8 mm. No choledocholithiasis. Pancreas: No mass, inflammatory changes, or other parenchymal abnormality identified. Spleen:  Within normal limits in size and appearance. Adrenals/Urinary Tract: Normal appearance of the adrenal glands. Small right kidney cysts noted. No mass or hydronephrosis identified.The left kidney is unremarkable. Stomach/Bowel: The stomach is normal. There is no pathologic dilatation of the upper abdominal bowel loops. Vascular/Lymphatic: No pathologically enlarged lymph nodes identified. No abdominal aortic aneurysm demonstrated. Other:  None. Musculoskeletal: No suspicious bone lesions identified. IMPRESSION: 1. Increase caliber of the common bowel duct without evidence for choledocholithiasis. 2. Gallstones. Cystic duct stone is suspected. There is also mild  a thickening involving the gallbladder wall. Cannot rule out early cholecystitis. 3. Mild hepatic steatosis. 4. Liver hemangiomas. Electronically Signed   By: Kerby Moors M.D.   On: 03/20/2016 19:30   Mr Jeananne Rama W/wo Cm/mrcp  Result Date: 03/20/2016 CLINICAL DATA:  Evaluate common bile duct dilatation. Epigastric pain. EXAM: MRI ABDOMEN WITHOUT AND WITH CONTRAST (INCLUDING MRCP) TECHNIQUE: Multiplanar multisequence MR imaging of the abdomen was performed both before and after the administration of intravenous contrast. Heavily T2-weighted images of the biliary and pancreatic ducts were obtained, and three-dimensional MRCP images were rendered by post processing. CONTRAST:  74m MULTIHANCE GADOBENATE DIMEGLUMINE 529 MG/ML IV SOLN COMPARISON:  None.  FINDINGS: Lower chest: No acute findings. Hepatobiliary: Mild hepatic steatosis. 9 mm enhancing lesion within the posterior right lobe of liver is identified, image 42 of series 16004. Likely benign hemangioma. Within the lateral right lobe of liver there is a hemangioma measuring 1.2 cm, image 37 of series 1604. No intrahepatic bile duct dilatation. Numerous stones are identified within the gallbladder. These measure up to 6 mm. Mild wall thickening of the gallbladder. Within the cystic duct there is a signal void measuring 7 mm, image 20 of series 9. The common bile duct measures 8 mm. No choledocholithiasis. Pancreas: No mass, inflammatory changes, or other parenchymal abnormality identified. Spleen:  Within normal limits in size and appearance. Adrenals/Urinary Tract: Normal appearance of the adrenal glands. Small right kidney cysts noted. No mass or hydronephrosis identified.The left kidney is unremarkable. Stomach/Bowel: The stomach is normal. There is no pathologic dilatation of the upper abdominal bowel loops. Vascular/Lymphatic: No pathologically enlarged lymph nodes identified. No abdominal aortic aneurysm demonstrated. Other:  None. Musculoskeletal: No suspicious bone lesions identified. IMPRESSION: 1. Increase caliber of the common bowel duct without evidence for choledocholithiasis. 2. Gallstones. Cystic duct stone is suspected. There is also mild a thickening involving the gallbladder wall. Cannot rule out early cholecystitis. 3. Mild hepatic steatosis. 4. Liver hemangiomas. Electronically Signed   By: TKerby MoorsM.D.   On: 03/20/2016 19:30   UKoreaAbdomen Limited Ruq  Result Date: 03/20/2016 CLINICAL DATA:  Epigastric pain EXAM: [place]ABDOMEN LIMITED - RIGHT UPPER QUADRANT COMPARISON:  None. FINDINGS: Gallbladder: The gallbladder is well seen and there are gallstones present the largest of 1.4 cm with some gallbladder sludge present as well. There is no pain over the gallbladder with compression.  Common bile duct: Diameter: The common bile duct is dilated measuring 13.9 cm. Assessment of the common bile duct is limited due to shadowing from overlying gallstones. A distal common bowel duct calculus, stricture, or mass is suspected in view of the dilatation of the common bile duct. Consider CT or preferably MR with MRCP to assess further. Liver: The liver somewhat echogenic suggesting mild fatty infiltration. No focal hepatic abnormality is seen. IMPRESSION: 1. Dilated common bile duct. Consider distal common bile duct calculus, stricture, or mass. Recommend CT or preferably MRI with MRCP to evaluate further. 2. Gallstones with gallbladder sludge. 3. Suspect mild fatty infiltration of the liver. Electronically Signed   By: PIvar DrapeM.D.   On: 03/20/2016 16:17        Scheduled Meds: . famotidine (PEPCID) IV  20 mg Intravenous Q12H  . piperacillin-tazobactam (ZOSYN)  IV  3.375 g Intravenous Q8H   Continuous Infusions: . sodium chloride 100 mL/hr at 03/21/16 1601     LOS: 0 days    Time spent: 25 min    ZKelvin Cellar MD [facility] Pager [phone] If  7PM-7AM, please contact night-coverage [email or web address] Password [facility] 03/21/2016, 5:04 PM

## 2016-03-21 NOTE — Care Management Note (Signed)
Case Management Note  Patient Details  Name: Chelsea Cannon MRN: [id number] Date of Birth: 15-Jan-1966  Subjective/Objective:        Presents with epigastric plan.Resides with husband/children. Independent with ADL's, no dme usage PTA. Pt without PCP and health insurance. CM spoke with pt regarding no PCP/ no insurance and provided pt with [facility] information. CM instructed pt to call clinic on Monday to establish post hospital f/u visit and PCP.    Action/Plan: MRCP pending....GI is to consult....Marland KitchenMarland KitchenCM to f/u with disposition needs.  Expected Discharge Date:                  Expected Discharge Plan:  Home/Self Care  In-House Referral:     Discharge planning Services  CM Consult  Post Acute Care Choice:    Choice offered to:     DME Arranged:    DME Agency:     HH Arranged:    HH Agency:     Status of Service:  In process, will continue to follow  If discussed at Long Length of Stay Meetings, dates discussed:    Additional Comments:  Sharin Mons, RN 03/21/2016, 11:06 AM

## 2016-03-21 NOTE — Progress Notes (Signed)
Pt was admitted from ED per stretcher accompanied by a nurse tech and family member pt was fully alert and oriented, self introduced to pt and family, ID bracelet checked fall prevention plan discussed with pt, admission package given, prescribed treatment started, pt made comfortable in bed call bell and phone within reach and pt able to demonstrate how to use them

## 2016-03-21 NOTE — Consult Note (Signed)
Chelsea Cannon   Consultation Note   03/21/2016  Chelsea Cannon Chelsea Cannon [id number] DOB: Jul 03, 1965 DOA: 03/20/2016   PCP: No PCP Per Patient / UNASSIGNED  Patient coming from/Resides with: Private residence/this with husband and children  Subjective: Chelsea Cannon is a 50 y.o. female without any reported significant medical or surgical history. Admitted to the internal medicine service on 03/20/2016 from the emergency room for epigastric abdominal pain thought to be secondary to cholelithiasis with acute cholecystitis. We have been asked to see this patient on consultation for cholecystectomy.  Objective:  US ABDOMEN LIMITED - RIGHT UPPER QUADRANT:IMPRESSION: 1. Dilated common bile duct. Consider distal common bile duct calculus, stricture, or mass. Recommend CT or preferably MRI with MRCP to evaluate further. 2. Gallstones with gallbladder sludge. 3. Suspect mild fatty infiltration of the liver.  MRI ABDOMEN WITHOUT AND WITH CONTRAST (INCLUDING MRCP): IMPRESSION: 1. Increase caliber of the common bowel duct without evidence for choledocholithiasis. 2. Gallstones. Cystic duct stone is suspected. There is also mild a thickening involving the gallbladder wall. Cannot rule out early cholecystitis. 3. Mild hepatic steatosis. 4. Liver hemangiomas.    Vital signs in last 24 hours: Temp:  [98.9 F (37.2 C)-99.2 F (37.3 C)] 98.9 F (37.2 C) (09/29 0610) Pulse Rate:  [67-89] 81 (09/29 0610) Resp:  [15-21] 18 (09/29 0610) BP: (103-128)/(60-72) 103/67 (09/29 0610) SpO2:  [97 %-100 %] 100 % (09/29 0610) Weight:  [70.8 kg (156 lb 1.4 oz)] 70.8 kg (156 lb 1.4 oz) (09/28 1922) Last BM Date: 03/20/16  Intake/Output from previous day: 09/28 0701 - 09/29 0700 In: 1600 [IV Piggyback:1600] Out: -  Intake/Output this shift: No intake/output data recorded.  PE: General: pleasant, WD, WN white female who is laying in bed in NAD HEENT: head is normocephalic,  atraumatic.  Sclera are noninjected. PERRLA. Ears and nose without any masses or lesions.  Mouth is pink and moist Heart: regular, rate, and rhythm.  Normal s1,s2. No obvious murmurs, gallops, or rubs noted.  Palpable radial and pedal pulses bilaterally Lungs: CTAB, no wheezes, rhonchi, or rales noted.  Respiratory effort nonlabored Abd: soft, ND, +BS, no masses, hernias, or organomegaly. Mild epigastric tenderness with palpation. Pain radiates to right and left upper quadrants without evidence of peritonitis.  MS: all 4 extremities are symmetrical with no cyanosis, clubbing, or edema. Skin: warm and dry with no masses, lesions, or rashes Psych: A&Ox3 with an appropriate affect.  Lab Results:   Recent Labs  03/20/16 1038 03/21/16 0536  WBC 14.7* 15.9*  HGB 13.5 12.5  HCT 41.6 38.9  PLT 411* 384   BMET  Recent Labs  03/20/16 1028 03/21/16 0536  NA 135 137  K 4.7 3.5  CL 105 108  CO2 20* 23  GLUCOSE 117* 117*  BUN 12 10  CREATININE 0.82 0.80  CALCIUM 9.5 8.9   PT/INR No results for input(s): LABPROT, INR in the last 72 hours. CMP     Component Value Date/Time   NA 137 03/21/2016 0536   K 3.5 03/21/2016 0536   CL 108 03/21/2016 0536   CO2 23 03/21/2016 0536   GLUCOSE 117 (H) 03/21/2016 0536   BUN 10 03/21/2016 0536   CREATININE 0.80 03/21/2016 0536   CALCIUM 8.9 03/21/2016 0536   PROT 6.8 03/21/2016 0536   ALBUMIN 3.6 03/21/2016 0536   AST 17 03/21/2016 0536   ALT 14 03/21/2016 0536   ALKPHOS 85 03/21/2016 0536   BILITOT 1.5 (H) 03/21/2016 0536   GFRNONAA >60 03/21/2016  [id number]   GFRAA >60 03/21/2016 0536   Lipase     Component Value Date/Time   LIPASE 18 03/21/2016 0536       Studies/Results: Dg Chest 2 View  Result Date: 03/20/2016 CLINICAL DATA:  Chest pain since this morning. EXAM: CHEST  2 VIEW COMPARISON:  None. FINDINGS: The heart size and mediastinal contours are within normal limits. Both lungs are clear. The visualized skeletal structures are  unremarkable. IMPRESSION: No active cardiopulmonary disease. Electronically Signed   By: Marin Olp M.D.   On: 03/20/2016 11:13   Mr 3d Recon At Scanner  Result Date: 03/20/2016 CLINICAL DATA:  Evaluate common bile duct dilatation. Epigastric pain. EXAM: MRI ABDOMEN WITHOUT AND WITH CONTRAST (INCLUDING MRCP) TECHNIQUE: Multiplanar multisequence MR imaging of the abdomen was performed both before and after the administration of intravenous contrast. Heavily T2-weighted images of the biliary and pancreatic ducts were obtained, and three-dimensional MRCP images were rendered by post processing. CONTRAST:  98mL MULTIHANCE GADOBENATE DIMEGLUMINE 529 MG/ML IV SOLN COMPARISON:  None. FINDINGS: Lower chest: No acute findings. Hepatobiliary: Mild hepatic steatosis. 9 mm enhancing lesion within the posterior right lobe of liver is identified, image 42 of series 16004. Likely benign hemangioma. Within the lateral right lobe of liver there is a hemangioma measuring 1.2 cm, image 37 of series 1604. No intrahepatic bile duct dilatation. Numerous stones are identified within the gallbladder. These measure up to 6 mm. Mild wall thickening of the gallbladder. Within the cystic duct there is a signal void measuring 7 mm, image 20 of series 9. The common bile duct measures 8 mm. No choledocholithiasis. Pancreas: No mass, inflammatory changes, or other parenchymal abnormality identified. Spleen:  Within normal limits in size and appearance. Adrenals/Urinary Tract: Normal appearance of the adrenal glands. Small right kidney cysts noted. No mass or hydronephrosis identified.The left kidney is unremarkable. Stomach/Bowel: The stomach is normal. There is no pathologic dilatation of the upper abdominal bowel loops. Vascular/Lymphatic: No pathologically enlarged lymph nodes identified. No abdominal aortic aneurysm demonstrated. Other:  None. Musculoskeletal: No suspicious bone lesions identified. IMPRESSION: 1. Increase caliber of the  common bowel duct without evidence for choledocholithiasis. 2. Gallstones. Cystic duct stone is suspected. There is also mild a thickening involving the gallbladder wall. Cannot rule out early cholecystitis. 3. Mild hepatic steatosis. 4. Liver hemangiomas. Electronically Signed   By: Kerby Moors M.D.   On: 03/20/2016 19:30   Mr Jeananne Rama W/wo Cm/mrcp  Result Date: 03/20/2016 CLINICAL DATA:  Evaluate common bile duct dilatation. Epigastric pain. EXAM: MRI ABDOMEN WITHOUT AND WITH CONTRAST (INCLUDING MRCP) TECHNIQUE: Multiplanar multisequence MR imaging of the abdomen was performed both before and after the administration of intravenous contrast. Heavily T2-weighted images of the biliary and pancreatic ducts were obtained, and three-dimensional MRCP images were rendered by post processing. CONTRAST:  55mL MULTIHANCE GADOBENATE DIMEGLUMINE 529 MG/ML IV SOLN COMPARISON:  None. FINDINGS: Lower chest: No acute findings. Hepatobiliary: Mild hepatic steatosis. 9 mm enhancing lesion within the posterior right lobe of liver is identified, image 42 of series 16004. Likely benign hemangioma. Within the lateral right lobe of liver there is a hemangioma measuring 1.2 cm, image 37 of series 1604. No intrahepatic bile duct dilatation. Numerous stones are identified within the gallbladder. These measure up to 6 mm. Mild wall thickening of the gallbladder. Within the cystic duct there is a signal void measuring 7 mm, image 20 of series 9. The common bile duct measures 8 mm. No choledocholithiasis. Pancreas: No mass, inflammatory changes, or other  parenchymal abnormality identified. Spleen:  Within normal limits in size and appearance. Adrenals/Urinary Tract: Normal appearance of the adrenal glands. Small right kidney cysts noted. No mass or hydronephrosis identified.The left kidney is unremarkable. Stomach/Bowel: The stomach is normal. There is no pathologic dilatation of the upper abdominal bowel loops. Vascular/Lymphatic: No  pathologically enlarged lymph nodes identified. No abdominal aortic aneurysm demonstrated. Other:  None. Musculoskeletal: No suspicious bone lesions identified. IMPRESSION: 1. Increase caliber of the common bowel duct without evidence for choledocholithiasis. 2. Gallstones. Cystic duct stone is suspected. There is also mild a thickening involving the gallbladder wall. Cannot rule out early cholecystitis. 3. Mild hepatic steatosis. 4. Liver hemangiomas. Electronically Signed   By: Kerby Moors M.D.   On: 03/20/2016 19:30   US Abdomen Limited Ruq  Result Date: 03/20/2016 CLINICAL DATA:  Epigastric pain EXAM: US ABDOMEN LIMITED - RIGHT UPPER QUADRANT COMPARISON:  None. FINDINGS: Gallbladder: The gallbladder is well seen and there are gallstones present the largest of 1.4 cm with some gallbladder sludge present as well. There is no pain over the gallbladder with compression. Common bile duct: Diameter: The common bile duct is dilated measuring 13.9 cm. Assessment of the common bile duct is limited due to shadowing from overlying gallstones. A distal common bowel duct calculus, stricture, or mass is suspected in view of the dilatation of the common bile duct. Consider CT or preferably MR with MRCP to assess further. Liver: The liver somewhat echogenic suggesting mild fatty infiltration. No focal hepatic abnormality is seen. IMPRESSION: 1. Dilated common bile duct. Consider distal common bile duct calculus, stricture, or mass. Recommend CT or preferably MRI with MRCP to evaluate further. 2. Gallstones with gallbladder sludge. 3. Suspect mild fatty infiltration of the liver. Electronically Signed   By: Ivar Drape M.D.   On: 03/20/2016 16:17    Anti-infectives: Anti-infectives    Start     Dose/Rate Route Frequency Ordered Stop   03/21/16 0000  piperacillin-tazobactam (ZOSYN) IVPB 3.375 g     3.375 g 12.5 mL/hr over 240 Minutes Intravenous Every 8 hours 03/20/16 1751     03/20/16 1700   piperacillin-tazobactam (ZOSYN) IVPB 3.375 g     3.375 g 100 mL/hr over 30 Minutes Intravenous  Once 03/20/16 1700 03/20/16 1803       Assessment/Plan  Principal Problem:   Common bile duct dilatation in the setting of epigastric abdominal pain  Active Problems:   Acute epigastric pain with cholelithiasis and probable early cholecystitis -Multiple gallstones with sludge without evidence of acute cholecystitis on ultrasound. Gallstones and possible cystic duct stone with mild gallbladder wall thickening on MRCP report. No laboratory evidence of associated pancreatitis.  -Symptom management with IV Toradol and for severe pain IV morphine -IV Pepcid while giving IV Toradol -Follow labs     Fatty liver disease, nonalcoholic -Incidental finding on abdominal ultrasound and very mild in nature -Current LFTs are normal with the exception of mildly elevated total bili level of 1.5    Leukocytosis -Currently without fever but given possibility of recent passage of common bile duct stone will treat empirically for gram-negative infection with IV Zosyn. WBC count increased today to 15.9 from 14.7 yesterday.  -Follow labs  Plan:  Continue Zosyn. NPO after midnight tonight with IVF in anticipation of laparoscopic cholecystectomy with IOC tomorrow.    LOS: 0 days    Dot Been [facility] Cannon 03/21/2016, 1:19 PM Pager: [phone]

## 2016-03-22 ENCOUNTER — Inpatient Hospital Stay (HOSPITAL_COMMUNITY): Payer: Medicaid Other | Admitting: Anesthesiology

## 2016-03-22 ENCOUNTER — Inpatient Hospital Stay (HOSPITAL_COMMUNITY): Payer: Medicaid Other

## 2016-03-22 ENCOUNTER — Encounter (HOSPITAL_COMMUNITY): Payer: Self-pay | Admitting: Anesthesiology

## 2016-03-22 ENCOUNTER — Encounter (HOSPITAL_COMMUNITY)
Admission: EM | Disposition: A | Discharge status: Home / Self Care | Payer: Self-pay | Source: Home / Self Care | Attending: Internal Medicine

## 2016-03-22 DIAGNOSIS — K810 Acute cholecystitis: Secondary | ICD-10-CM

## 2016-03-22 HISTORY — PX: CHOLECYSTECTOMY: SHX55

## 2016-03-22 LAB — SURGICAL PCR SCREEN
MRSA, PCR: NEGATIVE
STAPHYLOCOCCUS AUREUS: POSITIVE — AB

## 2016-03-22 SURGERY — LAPAROSCOPIC CHOLECYSTECTOMY WITH INTRAOPERATIVE CHOLANGIOGRAM
Anesthesia: General | Site: Abdomen

## 2016-03-22 MED ORDER — MUPIROCIN 2 % EX OINT
1.0000 "application " | TOPICAL_OINTMENT | Freq: Two times a day (BID) | CUTANEOUS | Status: DC
  Administered 2016-03-22 – 2016-03-24 (×4): 1 via NASAL
  Filled 2016-03-22 (×2): qty 22

## 2016-03-22 MED ORDER — CHLORHEXIDINE GLUCONATE CLOTH 2 % EX PADS
6.0000 | MEDICATED_PAD | Freq: Every day | CUTANEOUS | Status: DC
  Administered 2016-03-23 – 2016-03-24 (×2): 6 via TOPICAL

## 2016-03-22 MED ORDER — SUCCINYLCHOLINE CHLORIDE 200 MG/10ML IV SOSY
PREFILLED_SYRINGE | INTRAVENOUS | Status: AC
  Filled 2016-03-22: qty 20

## 2016-03-22 MED ORDER — MIDAZOLAM HCL 2 MG/2ML IJ SOLN
INTRAMUSCULAR | Status: AC
  Filled 2016-03-22: qty 2

## 2016-03-22 MED ORDER — IOPAMIDOL (ISOVUE-300) INJECTION 61%
INTRAVENOUS | Status: AC
  Filled 2016-03-22: qty 50

## 2016-03-22 MED ORDER — FENTANYL CITRATE (PF) 100 MCG/2ML IJ SOLN
INTRAMUSCULAR | Status: AC
  Filled 2016-03-22: qty 2

## 2016-03-22 MED ORDER — ROCURONIUM BROMIDE 10 MG/ML (PF) SYRINGE
PREFILLED_SYRINGE | INTRAVENOUS | Status: AC
  Filled 2016-03-22: qty 10

## 2016-03-22 MED ORDER — ONDANSETRON HCL 4 MG/2ML IJ SOLN
INTRAMUSCULAR | Status: DC | PRN
  Administered 2016-03-22: 4 mg via INTRAVENOUS

## 2016-03-22 MED ORDER — CEFAZOLIN SODIUM 1 G IJ SOLR
INTRAMUSCULAR | Status: AC
  Filled 2016-03-22: qty 20

## 2016-03-22 MED ORDER — PROPOFOL 10 MG/ML IV BOLUS
INTRAVENOUS | Status: AC
  Filled 2016-03-22: qty 20

## 2016-03-22 MED ORDER — GLYCOPYRROLATE 0.2 MG/ML IJ SOLN
INTRAMUSCULAR | Status: DC | PRN
  Administered 2016-03-22: 0.6 mg via INTRAVENOUS

## 2016-03-22 MED ORDER — BUPIVACAINE-EPINEPHRINE (PF) 0.25% -1:200000 IJ SOLN
INTRAMUSCULAR | Status: AC
  Filled 2016-03-22: qty 30

## 2016-03-22 MED ORDER — LIDOCAINE 2% (20 MG/ML) 5 ML SYRINGE
INTRAMUSCULAR | Status: AC
  Filled 2016-03-22: qty 5

## 2016-03-22 MED ORDER — LACTATED RINGERS IV SOLN
INTRAVENOUS | Status: DC
Start: 2016-03-22 — End: 2016-03-24
  Administered 2016-03-22 (×2): via INTRAVENOUS

## 2016-03-22 MED ORDER — NEOSTIGMINE METHYLSULFATE 5 MG/5ML IV SOSY
PREFILLED_SYRINGE | INTRAVENOUS | Status: AC
  Filled 2016-03-22: qty 5

## 2016-03-22 MED ORDER — LIDOCAINE HCL (CARDIAC) 20 MG/ML IV SOLN
INTRAVENOUS | Status: DC | PRN
  Administered 2016-03-22: 80 mg via INTRAVENOUS

## 2016-03-22 MED ORDER — EPHEDRINE 5 MG/ML INJ
INTRAVENOUS | Status: AC
  Filled 2016-03-22: qty 10

## 2016-03-22 MED ORDER — OXYCODONE HCL 5 MG PO TABS
5.0000 mg | ORAL_TABLET | ORAL | Status: DC | PRN
  Administered 2016-03-22 – 2016-03-24 (×5): 10 mg via ORAL
  Filled 2016-03-22 (×5): qty 2

## 2016-03-22 MED ORDER — NEOSTIGMINE METHYLSULFATE 10 MG/10ML IV SOLN
INTRAVENOUS | Status: DC | PRN
  Administered 2016-03-22: 3 mg via INTRAVENOUS

## 2016-03-22 MED ORDER — FENTANYL CITRATE (PF) 100 MCG/2ML IJ SOLN
INTRAMUSCULAR | Status: DC | PRN
  Administered 2016-03-22 (×2): 100 ug via INTRAVENOUS

## 2016-03-22 MED ORDER — HYDROMORPHONE HCL 1 MG/ML IJ SOLN: 0.2500 mg | INTRAMUSCULAR | Status: DC | PRN

## 2016-03-22 MED ORDER — PHENYLEPHRINE 40 MCG/ML (10ML) SYRINGE FOR IV PUSH (FOR BLOOD PRESSURE SUPPORT)
PREFILLED_SYRINGE | INTRAVENOUS | Status: AC
  Filled 2016-03-22: qty 10

## 2016-03-22 MED ORDER — PROPOFOL 10 MG/ML IV BOLUS
INTRAVENOUS | Status: AC
  Filled 2016-03-22: qty 40

## 2016-03-22 MED ORDER — ROCURONIUM BROMIDE 100 MG/10ML IV SOLN
INTRAVENOUS | Status: DC | PRN
  Administered 2016-03-22: 10 mg via INTRAVENOUS
  Administered 2016-03-22: 40 mg via INTRAVENOUS

## 2016-03-22 MED ORDER — IOPAMIDOL (ISOVUE-300) INJECTION 61%
INTRAVENOUS | Status: DC | PRN
  Administered 2016-03-22: 100 mL

## 2016-03-22 MED ORDER — PROPOFOL 10 MG/ML IV BOLUS
INTRAVENOUS | Status: DC | PRN
  Administered 2016-03-22: 120 mg via INTRAVENOUS

## 2016-03-22 MED ORDER — SODIUM CHLORIDE 0.9 % IR SOLN
Status: DC | PRN
  Administered 2016-03-22: 1000 mL

## 2016-03-22 MED ORDER — DEXAMETHASONE SODIUM PHOSPHATE 10 MG/ML IJ SOLN
INTRAMUSCULAR | Status: DC | PRN
  Administered 2016-03-22: 10 mg via INTRAVENOUS

## 2016-03-22 MED ORDER — BUPIVACAINE-EPINEPHRINE 0.25% -1:200000 IJ SOLN
INTRAMUSCULAR | Status: DC | PRN
  Administered 2016-03-22: 20 mL

## 2016-03-22 MED ORDER — GLYCOPYRROLATE 0.2 MG/ML IV SOSY
PREFILLED_SYRINGE | INTRAVENOUS | Status: AC
  Filled 2016-03-22: qty 3

## 2016-03-22 MED ORDER — MIDAZOLAM HCL 5 MG/5ML IJ SOLN
INTRAMUSCULAR | Status: DC | PRN
  Administered 2016-03-22: 2 mg via INTRAVENOUS

## 2016-03-22 MED ORDER — 0.9 % SODIUM CHLORIDE (POUR BTL) OPTIME
TOPICAL | Status: DC | PRN
Start: 2016-03-22 — End: 2016-03-22
  Administered 2016-03-22: 1000 mL

## 2016-03-22 SURGICAL SUPPLY — 52 items
ADH SKN CLS LQ APL DERMABOND (GAUZE/BANDAGES/DRESSINGS) ×1
APPLIER CLIP 5 13 M/L LIGAMAX5 (MISCELLANEOUS) ×3
APR CLP MED LRG 5 ANG JAW (MISCELLANEOUS) ×1
BAG SPEC RTRVL 10 TROC 200 (ENDOMECHANICALS) ×1
BLADE SURG ROTATE 9660 (MISCELLANEOUS) IMPLANT
CANISTER SUCTION 2500CC (MISCELLANEOUS) ×3 IMPLANT
CATH REDDICK CHOLANGI 4FR 50CM (CATHETERS) ×2 IMPLANT
CHLORAPREP W/TINT 26ML (MISCELLANEOUS) ×3 IMPLANT
CLIP APPLIE 5 13 M/L LIGAMAX5 (MISCELLANEOUS) ×1 IMPLANT
COVER MAYO STAND STRL (DRAPES) ×3 IMPLANT
COVER SURGICAL LIGHT HANDLE (MISCELLANEOUS) ×3 IMPLANT
CUTTER FLEX LINEAR 45M (STAPLE) ×2 IMPLANT
DERMABOND ADHESIVE PROPEN (GAUZE/BANDAGES/DRESSINGS) ×2
DERMABOND ADVANCED .7 DNX6 (GAUZE/BANDAGES/DRESSINGS) IMPLANT
DRAPE C-ARM 42X72 X-RAY (DRAPES) ×3 IMPLANT
ELECT REM PT RETURN 9FT ADLT (ELECTROSURGICAL) ×3
ELECTRODE REM PT RTRN 9FT ADLT (ELECTROSURGICAL) ×1 IMPLANT
FILTER SMOKE EVAC LAPAROSHD (FILTER) IMPLANT
GLOVE BIO SURGEON STRL SZ8 (GLOVE) ×3 IMPLANT
GLOVE BIOGEL PI IND STRL 8 (GLOVE) ×1 IMPLANT
GLOVE BIOGEL PI INDICATOR 8 (GLOVE) ×2
GOWN STRL REUS W/ TWL LRG LVL3 (GOWN DISPOSABLE) ×2 IMPLANT
GOWN STRL REUS W/ TWL XL LVL3 (GOWN DISPOSABLE) ×1 IMPLANT
GOWN STRL REUS W/TWL LRG LVL3 (GOWN DISPOSABLE) ×6
GOWN STRL REUS W/TWL XL LVL3 (GOWN DISPOSABLE) ×3
KIT BASIN OR (CUSTOM PROCEDURE TRAY) ×3 IMPLANT
KIT ROOM TURNOVER OR (KITS) ×3 IMPLANT
L-HOOK LAP DISP 36CM (ELECTROSURGICAL) ×3
LHOOK LAP DISP 36CM (ELECTROSURGICAL) ×1 IMPLANT
LIQUID BAND (GAUZE/BANDAGES/DRESSINGS) ×3 IMPLANT
NEEDLE 22X1 1/2 (OR ONLY) (NEEDLE) ×3 IMPLANT
NS IRRIG 1000ML POUR BTL (IV SOLUTION) ×3 IMPLANT
PAD ARMBOARD 7.5X6 YLW CONV (MISCELLANEOUS) ×3 IMPLANT
PENCIL BUTTON HOLSTER BLD 10FT (ELECTRODE) ×3 IMPLANT
POUCH RETRIEVAL ECOSAC 10 (ENDOMECHANICALS) ×1 IMPLANT
POUCH RETRIEVAL ECOSAC 10MM (ENDOMECHANICALS) ×2
RELOAD STAPLE TA45 3.5 REG BLU (ENDOMECHANICALS) ×3 IMPLANT
SCISSORS LAP 5X35 DISP (ENDOMECHANICALS) ×3 IMPLANT
SET CHOLANGIOGRAPH 5 50 .035 (SET/KITS/TRAYS/PACK) ×3 IMPLANT
SET IRRIG TUBING LAPAROSCOPIC (IRRIGATION / IRRIGATOR) ×3 IMPLANT
SLEEVE ENDOPATH XCEL 5M (ENDOMECHANICALS) ×6 IMPLANT
SPECIMEN JAR SMALL (MISCELLANEOUS) ×3 IMPLANT
SUT MNCRL AB 3-0 PS2 18 (SUTURE) ×3 IMPLANT
SUT VIC AB 4-0 PS2 27 (SUTURE) ×6 IMPLANT
TOWEL OR 17X24 6PK STRL BLUE (TOWEL DISPOSABLE) ×3 IMPLANT
TOWEL OR 17X26 10 PK STRL BLUE (TOWEL DISPOSABLE) ×3 IMPLANT
TRAY LAPAROSCOPIC MC (CUSTOM PROCEDURE TRAY) ×3 IMPLANT
TROCAR 12M 150ML BLUNT (TROCAR) ×2 IMPLANT
TROCAR XCEL BLUNT TIP 100MML (ENDOMECHANICALS) ×3 IMPLANT
TROCAR XCEL NON-BLD 11X100MML (ENDOMECHANICALS) ×3 IMPLANT
TROCAR XCEL NON-BLD 5MMX100MML (ENDOMECHANICALS) ×3 IMPLANT
TUBING INSUFFLATION (TUBING) ×3 IMPLANT

## 2016-03-22 NOTE — Anesthesia Preprocedure Evaluation (Addendum)
Anesthesia Evaluation  Patient identified by MRN, date of birth, ID band Patient awake    Reviewed: Allergy & Precautions, H&P , NPO status , Patient's Chart, lab work & pertinent test results  Airway Mallampati: III  TM Distance: >3 FB Neck ROM: Full  Mouth opening: Limited Mouth Opening  Dental no notable dental hx. (+) Teeth Intact, Dental Advisory Given   Pulmonary asthma ,    Pulmonary exam normal breath sounds clear to auscultation       Cardiovascular negative cardio ROS   Rhythm:Regular Rate:Normal     Neuro/Psych negative neurological ROS  negative psych ROS   GI/Hepatic negative GI ROS, Neg liver ROS,   Endo/Other  negative endocrine ROS  Renal/GU negative Renal ROS  negative genitourinary   Musculoskeletal   Abdominal   Peds  Hematology negative hematology ROS (+)   Anesthesia Other Findings   Reproductive/Obstetrics negative OB ROS                            Anesthesia Physical Anesthesia Plan  ASA: II  Anesthesia Plan: General   Post-op Pain Management:    Induction: Intravenous  Airway Management Planned: Oral ETT  Additional Equipment:   Intra-op Plan:   Post-operative Plan: Extubation in OR  Informed Consent: I have reviewed the patients History and Physical, chart, labs and discussed the procedure including the risks, benefits and alternatives for the proposed anesthesia with the patient or authorized representative who has indicated his/her understanding and acceptance.   Dental advisory given  Plan Discussed with: CRNA  Anesthesia Plan Comments:         Anesthesia Quick Evaluation

## 2016-03-22 NOTE — Anesthesia Postprocedure Evaluation (Signed)
Anesthesia Post Note  Patient: Aleyna Merriam Lebeau  Procedure(s) Performed: Procedure(s) (LRB): LAPAROSCOPIC CHOLECYSTECTOMY WITH INTRAOPERATIVE CHOLANGIOGRAM (N/A)  Patient location during evaluation: PACU Anesthesia Type: General Level of consciousness: awake and alert Pain management: pain level controlled Vital Signs Assessment: post-procedure vital signs reviewed and stable Respiratory status: spontaneous breathing, nonlabored ventilation, respiratory function stable and patient connected to nasal cannula oxygen Cardiovascular status: blood pressure returned to baseline and stable Postop Assessment: no signs of nausea or vomiting Anesthetic complications: no    Last Vitals:  Vitals:   03/22/16 1600 03/22/16 1615  BP: 128/73 135/79  Pulse:    Resp:    Temp:      Last Pain:  Vitals:   03/22/16 1615  TempSrc:   PainSc: 1                  Stevon Gough,W. EDMOND

## 2016-03-22 NOTE — Progress Notes (Signed)
PROGRESS NOTE    Chelsea Cannon  HFW:[id number] DOB: 1965-10-15 DOA: 03/20/2016 PCP: No PCP Per Patient   Brief Narrative:  Chelsea Cannon is a pleasant 50 year old female with no stenotic and past medical history presented to the emergency department on 03/12/2016 with complaints of right upper quadrant pain. Initial workup in the emergency room included a right upper quadrant ultrasound that showed dilated common bile duct measuring 13.9 cm. This was further worked up with an MRCP that did not reveal evidence of choledocholithiasis, will ever did reveal the presence of gallstones and possibly early cholecystitis. General surgery was consulted.    Assessment & Plan:   Principal Problem:   Common bile duct dilatation Active Problems:   Cholelithiasis without cholecystitis   Acute epigastric pain   Fatty liver disease, nonalcoholic   Leukocytosis   Cholelithiasis   1.  Possible early cholecystitis -Patient presenting with complaints of right upper quadrant pain with ultrasound showing common bile duct dilatation. This was further worked up with MRCP that did not reveal evidence of choledocholithiasis. Suspect passed stone -Lab showing elevated white count of 15,900, total bili 1.5, alk phos 85.  -Continue empiric IV antimicrobial therapy with Zosyn -Case was discussed with general surgery will consult for possible laparoscopic cholecystectomy -On 03/22/2016 she was taken to the operating room where she underwent laparoscopic cholecystectomy. She is being seen post-op, complaining of abdominal pain. Have as needed narcotic analgesics ordered.    DVT prophylaxis: SCD's Code Status: Full Code Family Communication: I spoke with multiple family members at bedside Disposition Plan: Anticipate discharge in the next 24-48 hours, await recs from general surgery  Consultants:   General Surgery  Procedures:   Laparoscopic cholecystectomy  Antimicrobials:   Zosyn  Subjective: She  is being seen post-operatively, complains of abd pain  Objective: Vitals:   03/22/16 1555 03/22/16 1600 03/22/16 1615 03/22/16 1658  BP: 125/75 128/73 135/79 132/76  Pulse: 73   75  Resp: 20     Temp: 97.6 F (36.4 C)   98.5 F (36.9 C)  TempSrc:    Oral  SpO2: 100%     Weight:      Height:        Intake/Output Summary (Last 24 hours) at 03/22/16 1703 Last data filed at 03/22/16 1553  Gross per 24 hour  Intake             1900 ml  Output               25 ml  Net             1875 ml   Filed Weights   03/20/16 1922  Weight: 70.8 kg (156 lb 1.4 oz)    Examination:  General exam: Mildly sedated, just got back from PACU  Respiratory system: Clear to auscultation. Respiratory effort normal. Cardiovascular system: S1 & S2 heard, RRR. No JVD, murmurs, rubs, gallops or clicks. No pedal edema. Gastrointestinal system: Abdominal surgical incision sites appear clean no evidence of hematoma.  Central nervous system: Alert and oriented. No focal neurological deficits. Extremities: Symmetric 5 x 5 power. Skin: No rashes, lesions or ulcers Psychiatry: Judgement and insight appear normal. Mood & affect appropriate.     Data Reviewed: I have personally reviewed following labs and imaging studies  CBC:  Recent Labs Lab 03/20/16 1038 03/21/16 0536  WBC 14.7* 15.9*  HGB 13.5 12.5  HCT 41.6 38.9  MCV 85.2 84.6  PLT 411* 027   Basic Metabolic Panel:  Recent Labs Lab 03/20/16 1028 03/21/16 0536  NA 135 137  K 4.7 3.5  CL 105 108  CO2 20* 23  GLUCOSE 117* 117*  BUN 12 10  CREATININE 0.82 0.80  CALCIUM 9.5 8.9   GFR: Estimated Creatinine Clearance: 81.1 mL/min (by C-G formula based on SCr of 0.8 mg/dL). Liver Function Tests:  Recent Labs Lab 03/20/16 1028 03/21/16 0536  AST 25 17  ALT 17 14  ALKPHOS 100 85  BILITOT 1.1 1.5*  PROT 7.9 6.8  ALBUMIN 4.3 3.6    Recent Labs Lab 03/20/16 1028 03/21/16 0536  LIPASE 16 18   No results for input(s): AMMONIA  in the last 168 hours. Coagulation Profile: No results for input(s): INR, PROTIME in the last 168 hours. Cardiac Enzymes: No results for input(s): CKTOTAL, CKMB, CKMBINDEX, TROPONINI in the last 168 hours. BNP (last 3 results) No results for input(s): PROBNP in the last 8760 hours. HbA1C: No results for input(s): HGBA1C in the last 72 hours. CBG: No results for input(s): GLUCAP in the last 168 hours. Lipid Profile: No results for input(s): CHOL, HDL, LDLCALC, TRIG, CHOLHDL, LDLDIRECT in the last 72 hours. Thyroid Function Tests: No results for input(s): TSH, T4TOTAL, FREET4, T3FREE, THYROIDAB in the last 72 hours. Anemia Panel: No results for input(s): VITAMINB12, FOLATE, FERRITIN, TIBC, IRON, RETICCTPCT in the last 72 hours. Sepsis Labs: No results for input(s): PROCALCITON, LATICACIDVEN in the last 168 hours.  Recent Results (from the past 240 hour(s))  Surgical pcr screen     Status: Abnormal   Collection Time: 03/22/16  7:20 AM  Result Value Ref Range Status   MRSA, PCR NEGATIVE NEGATIVE Final   Staphylococcus aureus POSITIVE (A) NEGATIVE Final    Comment:        The Xpert SA Assay (FDA approved for NASAL specimens in patients over 59 years of age), is one component of a comprehensive surveillance program.  Test performance has been validated by [facility] for patients greater than or equal to 30 year old. It is not intended to diagnose infection nor to guide or monitor treatment.          Radiology Studies: Mr 3d Recon At Scanner  Result Date: 03/20/2016 CLINICAL DATA:  Evaluate common bile duct dilatation. Epigastric pain. EXAM: MRI ABDOMEN WITHOUT AND WITH CONTRAST (INCLUDING MRCP) TECHNIQUE: Multiplanar multisequence MR imaging of the abdomen was performed both before and after the administration of intravenous contrast. Heavily T2-weighted images of the biliary and pancreatic ducts were obtained, and three-dimensional MRCP images were rendered by post  processing. CONTRAST:  79m MULTIHANCE GADOBENATE DIMEGLUMINE 529 MG/ML IV SOLN COMPARISON:  None. FINDINGS: Lower chest: No acute findings. Hepatobiliary: Mild hepatic steatosis. 9 mm enhancing lesion within the posterior right lobe of liver is identified, image 42 of series 16004. Likely benign hemangioma. Within the lateral right lobe of liver there is a hemangioma measuring 1.2 cm, image 37 of series 1604. No intrahepatic bile duct dilatation. Numerous stones are identified within the gallbladder. These measure up to 6 mm. Mild wall thickening of the gallbladder. Within the cystic duct there is a signal void measuring 7 mm, image 20 of series 9. The common bile duct measures 8 mm. No choledocholithiasis. Pancreas: No mass, inflammatory changes, or other parenchymal abnormality identified. Spleen:  Within normal limits in size and appearance. Adrenals/Urinary Tract: Normal appearance of the adrenal glands. Small right kidney cysts noted. No mass or hydronephrosis identified.The left kidney is unremarkable. Stomach/Bowel: The stomach is normal. There is  no pathologic dilatation of the upper abdominal bowel loops. Vascular/Lymphatic: No pathologically enlarged lymph nodes identified. No abdominal aortic aneurysm demonstrated. Other:  None. Musculoskeletal: No suspicious bone lesions identified. IMPRESSION: 1. Increase caliber of the common bowel duct without evidence for choledocholithiasis. 2. Gallstones. Cystic duct stone is suspected. There is also mild a thickening involving the gallbladder wall. Cannot rule out early cholecystitis. 3. Mild hepatic steatosis. 4. Liver hemangiomas. Electronically Signed   By: Kerby Moors M.D.   On: 03/20/2016 19:30   Mr Jeananne Rama W/wo Cm/mrcp  Result Date: 03/20/2016 CLINICAL DATA:  Evaluate common bile duct dilatation. Epigastric pain. EXAM: MRI ABDOMEN WITHOUT AND WITH CONTRAST (INCLUDING MRCP) TECHNIQUE: Multiplanar multisequence MR imaging of the abdomen was performed both  before and after the administration of intravenous contrast. Heavily T2-weighted images of the biliary and pancreatic ducts were obtained, and three-dimensional MRCP images were rendered by post processing. CONTRAST:  37m MULTIHANCE GADOBENATE DIMEGLUMINE 529 MG/ML IV SOLN COMPARISON:  None. FINDINGS: Lower chest: No acute findings. Hepatobiliary: Mild hepatic steatosis. 9 mm enhancing lesion within the posterior right lobe of liver is identified, image 42 of series 16004. Likely benign hemangioma. Within the lateral right lobe of liver there is a hemangioma measuring 1.2 cm, image 37 of series 1604. No intrahepatic bile duct dilatation. Numerous stones are identified within the gallbladder. These measure up to 6 mm. Mild wall thickening of the gallbladder. Within the cystic duct there is a signal void measuring 7 mm, image 20 of series 9. The common bile duct measures 8 mm. No choledocholithiasis. Pancreas: No mass, inflammatory changes, or other parenchymal abnormality identified. Spleen:  Within normal limits in size and appearance. Adrenals/Urinary Tract: Normal appearance of the adrenal glands. Small right kidney cysts noted. No mass or hydronephrosis identified.The left kidney is unremarkable. Stomach/Bowel: The stomach is normal. There is no pathologic dilatation of the upper abdominal bowel loops. Vascular/Lymphatic: No pathologically enlarged lymph nodes identified. No abdominal aortic aneurysm demonstrated. Other:  None. Musculoskeletal: No suspicious bone lesions identified. IMPRESSION: 1. Increase caliber of the common bowel duct without evidence for choledocholithiasis. 2. Gallstones. Cystic duct stone is suspected. There is also mild a thickening involving the gallbladder wall. Cannot rule out early cholecystitis. 3. Mild hepatic steatosis. 4. Liver hemangiomas. Electronically Signed   By: TKerby MoorsM.D.   On: 03/20/2016 19:30        Scheduled Meds: . Chlorhexidine Gluconate Cloth  6 each  Topical Daily  . famotidine (PEPCID) IV  20 mg Intravenous Q12H  . mupirocin ointment  1 application Nasal BID  . piperacillin-tazobactam (ZOSYN)  IV  3.375 g Intravenous Q8H   Continuous Infusions: . sodium chloride 100 mL/hr at 03/22/16 0537  . lactated ringers 10 mL/hr at 03/22/16 1303     LOS: 1 day    Time spent: 25 min    ZKelvin Cellar MD [facility] Pager [phone] If 7PM-7AM, please contact night-coverage [email or web address] Password [email or web address] 03/22/2016, 5:03 PM

## 2016-03-22 NOTE — Transfer of Care (Signed)
Immediate Anesthesia Transfer of Care Note  Patient: Chelsea Cannon  Procedure(s) Performed: Procedure(s): LAPAROSCOPIC CHOLECYSTECTOMY WITH INTRAOPERATIVE CHOLANGIOGRAM (N/A)  Patient Location: PACU  Anesthesia Type:General  Level of Consciousness: sedated  Airway & Oxygen Therapy: Patient Spontanous Breathing and Patient connected to face mask oxygen  Post-op Assessment: Report given to RN and Post -op Vital signs reviewed and stable  Post vital signs: Reviewed and stable  Last Vitals:  Vitals:   03/21/16 2107 03/22/16 0609  BP: (!) 121/56 (!) 100/57  Pulse: 71 68  Resp: 16 16  Temp: 36.7 C 36.9 C    Last Pain:  Vitals:   03/22/16 0820  TempSrc:   PainSc: 0-No pain      Patients Stated Pain Goal: 0 ([id number])  Complications: No apparent anesthesia complications

## 2016-03-22 NOTE — Op Note (Signed)
03/20/2016 - 03/22/2016  3:38 PM  PATIENT:  Chelsea Cannon  50 y.o. female  PRE-OPERATIVE DIAGNOSIS:  CHOLECYSTITIS  POST-OPERATIVE DIAGNOSIS:  CHOLECYSTITIS  PROCEDURE:  Procedure(s): LAPAROSCOPIC CHOLECYSTECTOMY  SURGEON:  Georganna Skeans, MD  ASSISTANTS: Armandina Gemma, MD   ANESTHESIA:   local and general  EBL:  Total I/O In: 600 [I.V.:600] Out: -   BLOOD ADMINISTERED:none  DRAINS: none   SPECIMEN:  Excision  DISPOSITION OF SPECIMEN:  PATHOLOGY  COUNTS:  YES  DICTATION: .Dragon Dictation Findings: Acute cholecystitis with hydrops, unable to complete cholangiogram  Procedure in detail: [name] presents for cholecystectomy. She was identified in the preop holding area. Informed consent was obtained. She received intravenous antibiotics on protocol. She was brought to the operating room and general endotracheal anesthesia was administered by the anesthesia staff. Her abdomen was prepped and draped in sterile fashion. Time out procedure was done. The infraumbilical region was infiltrated with local. Infraumbilical incision was made. Subcutaneous tissues were dissected down revealing the anterior fascia. This was divided sharply along the midline. Peritoneal cavity was entered under direct vision without complication. A 0 Vicryl pursestring was placed around the fascial opening. Hassan trocar was inserted into the abdomen. The abdomen was insufflated with carbon dioxide in standard fashion. Under direct vision a 5 mm epigastric and 2 5 mm right-sided ports were placed. Local was used at each port site. Dome the gallbladder was retracted superior medially. The infundibulum was retracted inferior laterally. Dissection began laterally and progressed medially identifying that there wasn't a clear cystic duct.. The infundibulum of the gallbladder terminated at the side of the common bile duct. Cystic artery was easily seen. This was clipped twice proximally and divided distally with  cautery. Further dissection confirmed the morphology of the infundibulum and the common bile duct. Dome down approach was used to through the dome and body of the gallbladder from the liver bed using cautery. Good hemostasis was obtained. A small nick was made in the infundibulum and a Reddick cholangiogram catheter was inserted. Unfortunately, we were unable to get a good cholangiogram. The catheter was removed. The epigastric port site was upsized to 12 mm. Endo GIA was used to divide the infundibulum just above the junction to the common bile duct. There was excellent closure of the staple line. The gallbladder was placed in a bag and removed from the abdomen. Liver bed was irrigated. There was good hemostasis. Irrigation fluid was evacuated. Ports were removed under direct vision. Pneumoperitoneum was released. The infraumbilical fascia was closed by tying the pursestring. All 4 wounds were closed with running 4-0 Vicryl subcuticular followed by liquid band. All counts were correct. She tolerated procedure well without apparent complication was taken recovery in stable condition. PATIENT DISPOSITION:  PACU - hemodynamically stable.   Delay start of Pharmacological VTE agent (>24hrs) due to surgical blood loss or risk of bleeding:  no  Georganna Skeans, MD, MPH, FACS Pager: [phone]  9/30/20173:38 PM

## 2016-03-22 NOTE — Anesthesia Procedure Notes (Signed)
Procedure Name: Intubation Date/Time: 03/22/2016 1:40 PM Performed by: Maude Leriche D Pre-anesthesia Checklist: Patient identified, Emergency Drugs available, Suction available, Patient being monitored and Timeout performed Patient Re-evaluated:Patient Re-evaluated prior to inductionOxygen Delivery Method: Circle system utilized Preoxygenation: Pre-oxygenation with 100% oxygen Intubation Type: IV induction Ventilation: Mask ventilation without difficulty Laryngoscope Size: Miller and 2 Grade View: Grade III Tube type: Oral Tube size: 7.5 mm Number of attempts: 1 Airway Equipment and Method: Stylet Placement Confirmation: ETT inserted through vocal cords under direct vision,  positive ETCO2 and breath sounds checked- equal and bilateral Secured at: 21 cm Tube secured with: Tape Dental Injury: Teeth and Oropharynx as per pre-operative assessment  Difficulty Due To: Difficult Airway- due to limited oral opening and Difficult Airway- due to anterior larynx

## 2016-03-22 NOTE — Progress Notes (Signed)
Day of Surgery  Subjective: Less RUQ pain  Objective: Vital signs in last 24 hours: Temp:  [98.1 F (36.7 C)-98.4 F (36.9 C)] 98.4 F (36.9 C) (09/30 0609) Pulse Rate:  [68-85] 68 (09/30 0609) Resp:  [16-20] 16 (09/30 0609) BP: (100-121)/(56-57) 100/57 (09/30 0609) SpO2:  [98 %-99 %] 99 % (09/30 0609) Last BM Date: 03/21/16  Intake/Output from previous day: 09/29 0701 - 09/30 0700 In: 1100 [I.V.:1000; IV Piggyback:100] Out: -  Intake/Output this shift: No intake/output data recorded.  General appearance: cooperative Resp: clear to auscultation bilaterally Cardio: regular rate and rhythm GI: soft, mild tenderness RUQ  Lab Results:   Recent Labs  03/20/16 1038 03/21/16 0536  WBC 14.7* 15.9*  HGB 13.5 12.5  HCT 41.6 38.9  PLT 411* 384   BMET  Recent Labs  03/20/16 1028 03/21/16 0536  NA 135 137  K 4.7 3.5  CL 105 108  CO2 20* 23  GLUCOSE 117* 117*  BUN 12 10  CREATININE 0.82 0.80  CALCIUM 9.5 8.9   PT/INR No results for input(s): LABPROT, INR in the last 72 hours. ABG No results for input(s): PHART, HCO3 in the last 72 hours.  Invalid input(s): PCO2, PO2  Studies/Results: Dg Chest 2 View  Result Date: 03/20/2016 CLINICAL DATA:  Chest pain since this morning. EXAM: CHEST  2 VIEW COMPARISON:  None. FINDINGS: The heart size and mediastinal contours are within normal limits. Both lungs are clear. The visualized skeletal structures are unremarkable. IMPRESSION: No active cardiopulmonary disease. Electronically Signed   By: Marin Olp M.D.   On: 03/20/2016 11:13   Mr 3d Recon At Scanner  Result Date: 03/20/2016 CLINICAL DATA:  Evaluate common bile duct dilatation. Epigastric pain. EXAM: MRI ABDOMEN WITHOUT AND WITH CONTRAST (INCLUDING MRCP) TECHNIQUE: Multiplanar multisequence MR imaging of the abdomen was performed both before and after the administration of intravenous contrast. Heavily T2-weighted images of the biliary and pancreatic ducts were  obtained, and three-dimensional MRCP images were rendered by post processing. CONTRAST:  6mL MULTIHANCE GADOBENATE DIMEGLUMINE 529 MG/ML IV SOLN COMPARISON:  None. FINDINGS: Lower chest: No acute findings. Hepatobiliary: Mild hepatic steatosis. 9 mm enhancing lesion within the posterior right lobe of liver is identified, image 42 of series 16004. Likely benign hemangioma. Within the lateral right lobe of liver there is a hemangioma measuring 1.2 cm, image 37 of series 1604. No intrahepatic bile duct dilatation. Numerous stones are identified within the gallbladder. These measure up to 6 mm. Mild wall thickening of the gallbladder. Within the cystic duct there is a signal void measuring 7 mm, image 20 of series 9. The common bile duct measures 8 mm. No choledocholithiasis. Pancreas: No mass, inflammatory changes, or other parenchymal abnormality identified. Spleen:  Within normal limits in size and appearance. Adrenals/Urinary Tract: Normal appearance of the adrenal glands. Small right kidney cysts noted. No mass or hydronephrosis identified.The left kidney is unremarkable. Stomach/Bowel: The stomach is normal. There is no pathologic dilatation of the upper abdominal bowel loops. Vascular/Lymphatic: No pathologically enlarged lymph nodes identified. No abdominal aortic aneurysm demonstrated. Other:  None. Musculoskeletal: No suspicious bone lesions identified. IMPRESSION: 1. Increase caliber of the common bowel duct without evidence for choledocholithiasis. 2. Gallstones. Cystic duct stone is suspected. There is also mild a thickening involving the gallbladder wall. Cannot rule out early cholecystitis. 3. Mild hepatic steatosis. 4. Liver hemangiomas. Electronically Signed   By: Kerby Moors M.D.   On: 03/20/2016 19:30   Mr Abd W/wo Cm/mrcp  Result Date:  03/20/2016 CLINICAL DATA:  Evaluate common bile duct dilatation. Epigastric pain. EXAM: MRI ABDOMEN WITHOUT AND WITH CONTRAST (INCLUDING MRCP) TECHNIQUE:  Multiplanar multisequence MR imaging of the abdomen was performed both before and after the administration of intravenous contrast. Heavily T2-weighted images of the biliary and pancreatic ducts were obtained, and three-dimensional MRCP images were rendered by post processing. CONTRAST:  65mL MULTIHANCE GADOBENATE DIMEGLUMINE 529 MG/ML IV SOLN COMPARISON:  None. FINDINGS: Lower chest: No acute findings. Hepatobiliary: Mild hepatic steatosis. 9 mm enhancing lesion within the posterior right lobe of liver is identified, image 42 of series 16004. Likely benign hemangioma. Within the lateral right lobe of liver there is a hemangioma measuring 1.2 cm, image 37 of series 1604. No intrahepatic bile duct dilatation. Numerous stones are identified within the gallbladder. These measure up to 6 mm. Mild wall thickening of the gallbladder. Within the cystic duct there is a signal void measuring 7 mm, image 20 of series 9. The common bile duct measures 8 mm. No choledocholithiasis. Pancreas: No mass, inflammatory changes, or other parenchymal abnormality identified. Spleen:  Within normal limits in size and appearance. Adrenals/Urinary Tract: Normal appearance of the adrenal glands. Small right kidney cysts noted. No mass or hydronephrosis identified.The left kidney is unremarkable. Stomach/Bowel: The stomach is normal. There is no pathologic dilatation of the upper abdominal bowel loops. Vascular/Lymphatic: No pathologically enlarged lymph nodes identified. No abdominal aortic aneurysm demonstrated. Other:  None. Musculoskeletal: No suspicious bone lesions identified. IMPRESSION: 1. Increase caliber of the common bowel duct without evidence for choledocholithiasis. 2. Gallstones. Cystic duct stone is suspected. There is also mild a thickening involving the gallbladder wall. Cannot rule out early cholecystitis. 3. Mild hepatic steatosis. 4. Liver hemangiomas. Electronically Signed   By: Kerby Moors M.D.   On: 03/20/2016 19:30    US Abdomen Limited Ruq  Result Date: 03/20/2016 CLINICAL DATA:  Epigastric pain EXAM: US ABDOMEN LIMITED - RIGHT UPPER QUADRANT COMPARISON:  None. FINDINGS: Gallbladder: The gallbladder is well seen and there are gallstones present the largest of 1.4 cm with some gallbladder sludge present as well. There is no pain over the gallbladder with compression. Common bile duct: Diameter: The common bile duct is dilated measuring 13.9 cm. Assessment of the common bile duct is limited due to shadowing from overlying gallstones. A distal common bowel duct calculus, stricture, or mass is suspected in view of the dilatation of the common bile duct. Consider CT or preferably MR with MRCP to assess further. Liver: The liver somewhat echogenic suggesting mild fatty infiltration. No focal hepatic abnormality is seen. IMPRESSION: 1. Dilated common bile duct. Consider distal common bile duct calculus, stricture, or mass. Recommend CT or preferably MRI with MRCP to evaluate further. 2. Gallstones with gallbladder sludge. 3. Suspect mild fatty infiltration of the liver. Electronically Signed   By: Ivar Drape M.D.   On: 03/20/2016 16:17    Anti-infectives: Anti-infectives    Start     Dose/Rate Route Frequency Ordered Stop   03/21/16 0000  piperacillin-tazobactam (ZOSYN) IVPB 3.375 g     3.375 g 12.5 mL/hr over 240 Minutes Intravenous Every 8 hours 03/20/16 1751     03/20/16 1700  piperacillin-tazobactam (ZOSYN) IVPB 3.375 g     3.375 g 100 mL/hr over 30 Minutes Intravenous  Once 03/20/16 1700 03/20/16 1803      Assessment/Plan: Cholecystitis with cholelithiasis - likely passed a CBD stone. For lap chole/IOC today. Procedure, risks, and benefits discussed. She agrees.  LOS: 1 day  Doniel Maiello E 03/22/2016

## 2016-03-22 NOTE — Progress Notes (Signed)
Day of Surgery  Subjective: No complaints  For LGB later today   Objective: Vital signs in last 24 hours: Temp:  [98.1 F (36.7 C)-98.4 F (36.9 C)] 98.4 F (36.9 C) (09/30 0609) Pulse Rate:  [68-85] 68 (09/30 0609) Resp:  [16-20] 16 (09/30 0609) BP: (100-121)/(56-57) 100/57 (09/30 0609) SpO2:  [98 %-99 %] 99 % (09/30 0609) Last BM Date: 03/21/16  Intake/Output from previous day: 09/29 0701 - 09/30 0700 In: 1100 [I.V.:1000; IV Piggyback:100] Out: -  Intake/Output this shift: No intake/output data recorded.  GI: soft min tenderness epigastrium  Lab Results:   Recent Labs  03/20/16 1038 03/21/16 0536  WBC 14.7* 15.9*  HGB 13.5 12.5  HCT 41.6 38.9  PLT 411* 384   BMET  Recent Labs  03/20/16 1028 03/21/16 0536  NA 135 137  K 4.7 3.5  CL 105 108  CO2 20* 23  GLUCOSE 117* 117*  BUN 12 10  CREATININE 0.82 0.80  CALCIUM 9.5 8.9   PT/INR No results for input(s): LABPROT, INR in the last 72 hours. ABG No results for input(s): PHART, HCO3 in the last 72 hours.  Invalid input(s): PCO2, PO2  Studies/Results: Dg Chest 2 View  Result Date: 03/20/2016 CLINICAL DATA:  Chest pain since this morning. EXAM: CHEST  2 VIEW COMPARISON:  None. FINDINGS: The heart size and mediastinal contours are within normal limits. Both lungs are clear. The visualized skeletal structures are unremarkable. IMPRESSION: No active cardiopulmonary disease. Electronically Signed   By: Marin Olp M.D.   On: 03/20/2016 11:13   Mr 3d Recon At Scanner  Result Date: 03/20/2016 CLINICAL DATA:  Evaluate common bile duct dilatation. Epigastric pain. EXAM: MRI ABDOMEN WITHOUT AND WITH CONTRAST (INCLUDING MRCP) TECHNIQUE: Multiplanar multisequence MR imaging of the abdomen was performed both before and after the administration of intravenous contrast. Heavily T2-weighted images of the biliary and pancreatic ducts were obtained, and three-dimensional MRCP images were rendered by post processing.  CONTRAST:  24mL MULTIHANCE GADOBENATE DIMEGLUMINE 529 MG/ML IV SOLN COMPARISON:  None. FINDINGS: Lower chest: No acute findings. Hepatobiliary: Mild hepatic steatosis. 9 mm enhancing lesion within the posterior right lobe of liver is identified, image 42 of series 16004. Likely benign hemangioma. Within the lateral right lobe of liver there is a hemangioma measuring 1.2 cm, image 37 of series 1604. No intrahepatic bile duct dilatation. Numerous stones are identified within the gallbladder. These measure up to 6 mm. Mild wall thickening of the gallbladder. Within the cystic duct there is a signal void measuring 7 mm, image 20 of series 9. The common bile duct measures 8 mm. No choledocholithiasis. Pancreas: No mass, inflammatory changes, or other parenchymal abnormality identified. Spleen:  Within normal limits in size and appearance. Adrenals/Urinary Tract: Normal appearance of the adrenal glands. Small right kidney cysts noted. No mass or hydronephrosis identified.The left kidney is unremarkable. Stomach/Bowel: The stomach is normal. There is no pathologic dilatation of the upper abdominal bowel loops. Vascular/Lymphatic: No pathologically enlarged lymph nodes identified. No abdominal aortic aneurysm demonstrated. Other:  None. Musculoskeletal: No suspicious bone lesions identified. IMPRESSION: 1. Increase caliber of the common bowel duct without evidence for choledocholithiasis. 2. Gallstones. Cystic duct stone is suspected. There is also mild a thickening involving the gallbladder wall. Cannot rule out early cholecystitis. 3. Mild hepatic steatosis. 4. Liver hemangiomas. Electronically Signed   By: Kerby Moors M.D.   On: 03/20/2016 19:30   Mr Jeananne Rama W/wo Cm/mrcp  Result Date: 03/20/2016 CLINICAL DATA:  Evaluate common bile duct  dilatation. Epigastric pain. EXAM: MRI ABDOMEN WITHOUT AND WITH CONTRAST (INCLUDING MRCP) TECHNIQUE: Multiplanar multisequence MR imaging of the abdomen was performed both before and  after the administration of intravenous contrast. Heavily T2-weighted images of the biliary and pancreatic ducts were obtained, and three-dimensional MRCP images were rendered by post processing. CONTRAST:  57mL MULTIHANCE GADOBENATE DIMEGLUMINE 529 MG/ML IV SOLN COMPARISON:  None. FINDINGS: Lower chest: No acute findings. Hepatobiliary: Mild hepatic steatosis. 9 mm enhancing lesion within the posterior right lobe of liver is identified, image 42 of series 16004. Likely benign hemangioma. Within the lateral right lobe of liver there is a hemangioma measuring 1.2 cm, image 37 of series 1604. No intrahepatic bile duct dilatation. Numerous stones are identified within the gallbladder. These measure up to 6 mm. Mild wall thickening of the gallbladder. Within the cystic duct there is a signal void measuring 7 mm, image 20 of series 9. The common bile duct measures 8 mm. No choledocholithiasis. Pancreas: No mass, inflammatory changes, or other parenchymal abnormality identified. Spleen:  Within normal limits in size and appearance. Adrenals/Urinary Tract: Normal appearance of the adrenal glands. Small right kidney cysts noted. No mass or hydronephrosis identified.The left kidney is unremarkable. Stomach/Bowel: The stomach is normal. There is no pathologic dilatation of the upper abdominal bowel loops. Vascular/Lymphatic: No pathologically enlarged lymph nodes identified. No abdominal aortic aneurysm demonstrated. Other:  None. Musculoskeletal: No suspicious bone lesions identified. IMPRESSION: 1. Increase caliber of the common bowel duct without evidence for choledocholithiasis. 2. Gallstones. Cystic duct stone is suspected. There is also mild a thickening involving the gallbladder wall. Cannot rule out early cholecystitis. 3. Mild hepatic steatosis. 4. Liver hemangiomas. Electronically Signed   By: Kerby Moors M.D.   On: 03/20/2016 19:30   US Abdomen Limited Ruq  Result Date: 03/20/2016 CLINICAL DATA:  Epigastric  pain EXAM: US ABDOMEN LIMITED - RIGHT UPPER QUADRANT COMPARISON:  None. FINDINGS: Gallbladder: The gallbladder is well seen and there are gallstones present the largest of 1.4 cm with some gallbladder sludge present as well. There is no pain over the gallbladder with compression. Common bile duct: Diameter: The common bile duct is dilated measuring 13.9 cm. Assessment of the common bile duct is limited due to shadowing from overlying gallstones. A distal common bowel duct calculus, stricture, or mass is suspected in view of the dilatation of the common bile duct. Consider CT or preferably MR with MRCP to assess further. Liver: The liver somewhat echogenic suggesting mild fatty infiltration. No focal hepatic abnormality is seen. IMPRESSION: 1. Dilated common bile duct. Consider distal common bile duct calculus, stricture, or mass. Recommend CT or preferably MRI with MRCP to evaluate further. 2. Gallstones with gallbladder sludge. 3. Suspect mild fatty infiltration of the liver. Electronically Signed   By: Ivar Drape M.D.   On: 03/20/2016 16:17    Anti-infectives: Anti-infectives    Start     Dose/Rate Route Frequency Ordered Stop   03/21/16 0000  piperacillin-tazobactam (ZOSYN) IVPB 3.375 g     3.375 g 12.5 mL/hr over 240 Minutes Intravenous Every 8 hours 03/20/16 1751     03/20/16 1700  piperacillin-tazobactam (ZOSYN) IVPB 3.375 g     3.375 g 100 mL/hr over 30 Minutes Intravenous  Once 03/20/16 1700 03/20/16 1803      Assessment/Plan: To OR LGB IOC later today    LOS: 1 day    Kaelene Elliston A. 03/22/2016

## 2016-03-22 NOTE — Progress Notes (Signed)
Rings x 2 removed and given to family at bedside. One was similar to high school or college ring and the other was a plain yellow wedding band.

## 2016-03-23 ENCOUNTER — Encounter (HOSPITAL_COMMUNITY): Payer: Self-pay | Admitting: General Surgery

## 2016-03-23 LAB — CBC
HEMATOCRIT: 33.9 % — AB (ref 36.0–46.0)
HEMOGLOBIN: 10.8 g/dL — AB (ref 12.0–15.0)
MCH: 27.6 pg (ref 26.0–34.0)
MCHC: 31.9 g/dL (ref 30.0–36.0)
MCV: 86.7 fL (ref 78.0–100.0)
Platelets: 349 10*3/uL (ref 150–400)
RBC: 3.91 MIL/uL (ref 3.87–5.11)
RDW: 13.9 % (ref 11.5–15.5)
WBC: 14 10*3/uL — ABNORMAL HIGH (ref 4.0–10.5)

## 2016-03-23 LAB — BASIC METABOLIC PANEL
ANION GAP: 8 (ref 5–15)
BUN: 7 mg/dL (ref 6–20)
CO2: 21 mmol/L — AB (ref 22–32)
Calcium: 8.5 mg/dL — ABNORMAL LOW (ref 8.9–10.3)
Chloride: 112 mmol/L — ABNORMAL HIGH (ref 101–111)
Creatinine, Ser: 0.79 mg/dL (ref 0.44–1.00)
GFR calc Af Amer: >60 mL/min (ref >60)
GFR calc non Af Amer: >60 mL/min (ref >60)
GLUCOSE: 100 mg/dL — AB (ref 65–99)
POTASSIUM: 3.4 mmol/L — AB (ref 3.5–5.1)
Sodium: 141 mmol/L (ref 135–145)

## 2016-03-23 MED ORDER — FAMOTIDINE 20 MG PO TABS
20.0000 mg | ORAL_TABLET | Freq: Two times a day (BID) | ORAL | Status: DC
  Administered 2016-03-23 – 2016-03-24 (×2): 20 mg via ORAL
  Filled 2016-03-23 (×2): qty 1

## 2016-03-23 NOTE — Progress Notes (Signed)
1 Day Post-Op  Subjective: Very sore   Objective: Vital signs in last 24 hours: Temp:  [97.6 F (36.4 C)-98.5 F (36.9 C)] 98.3 F (36.8 C) (10/01 [id number]) Pulse Rate:  [65-75] 69 (10/01 0642) Resp:  [16-20] 18 (10/01 0642) BP: (117-135)/(55-80) 125/64 (10/01 0642) SpO2:  [99 %-100 %] 100 % (10/01 0642) Last BM Date: 03/21/16  Intake/Output from previous day: 09/30 0701 - 10/01 0700 In: 2000 [P.O.:100; I.V.:1750; IV Piggyback:150] Out: 2225 [Urine:2200; Blood:25] Intake/Output this shift: No intake/output data recorded.  Incision/Wound:port sites CDI  Soft sore abdomen  No peritonitis   Lab Results:   Recent Labs  03/21/16 0536 03/23/16 0752  WBC 15.9* 14.0*  HGB 12.5 10.8*  HCT 38.9 33.9*  PLT 384 349   BMET  Recent Labs  03/21/16 0536 03/23/16 0752  NA 137 141  K 3.5 3.4*  CL 108 112*  CO2 23 21*  GLUCOSE 117* 100*  BUN 10 7  CREATININE 0.80 0.79  CALCIUM 8.9 8.5*   PT/INR No results for input(s): LABPROT, INR in the last 72 hours. ABG No results for input(s): PHART, HCO3 in the last 72 hours.  Invalid input(s): PCO2, PO2  Studies/Results: Dg Cholangiogram Operative  Result Date: 03/22/2016 CLINICAL DATA:  Intraoperative cholangiogram during laparoscopic cholecystectomy. EXAM: INTRAOPERATIVE CHOLANGIOGRAM FLUOROSCOPY TIME:  25.7 seconds COMPARISON:  MRCP - 03/20/2016 FINDINGS: Intraoperative cholangiographic images of the right upper abdominal quadrant during laparoscopic cholecystectomy are provided for review. Provided images demonstrate opacification of the neck, body and fundus of the gallbladder. There are multiple filling defects seen throughout the gallbladder compatible with cholelithiasis. There is no definitive opacification of the cystic or common bile ducts. IMPRESSION: 1. Cholelithiasis. 2. Nondiagnostic evaluation for choledocholithiasis given lack of opacification of the cystic or common bile ducts. Further evaluation with ERCP could be  performed as indicated. Electronically Signed   By: Sandi Mariscal M.D.   On: 03/22/2016 17:23    Anti-infectives: Anti-infectives    Start     Dose/Rate Route Frequency Ordered Stop   03/21/16 0000  piperacillin-tazobactam (ZOSYN) IVPB 3.375 g     3.375 g 12.5 mL/hr over 240 Minutes Intravenous Every 8 hours 03/20/16 1751     03/20/16 1700  piperacillin-tazobactam (ZOSYN) IVPB 3.375 g     3.375 g 100 mL/hr over 30 Minutes Intravenous  Once 03/20/16 1700 03/20/16 1803      Assessment/Plan: s/p Procedure(s): LAPAROSCOPIC CHOLECYSTECTOMY WITH INTRAOPERATIVE CHOLANGIOGRAM (N/A) Ambulate and advance diet Would recheck CBC in am If down can go home Monday   LOS: 2 days    Jarrid Lienhard A. 03/23/2016

## 2016-03-23 NOTE — Progress Notes (Signed)
PROGRESS NOTE    Chelsea Cannon  [id number] DOB: 07-11-1965 DOA: 03/20/2016 PCP: No PCP Per Patient   Brief Narrative:  Chelsea Cannon is a pleasant 50 year old female with no stenotic and past medical history presented to the emergency department on 03/12/2016 with complaints of right upper quadrant pain. Initial workup in the emergency room included a right upper quadrant ultrasound that showed dilated common bile duct measuring 13.9 cm. This was further worked up with an MRCP that did not reveal evidence of choledocholithiasis, will ever did reveal the presence of gallstones and possibly early cholecystitis. General surgery was consulted.    Assessment & Plan:   Principal Problem:   Common bile duct dilatation Active Problems:   Cholelithiasis without cholecystitis   Acute epigastric pain   Fatty liver disease, nonalcoholic   Leukocytosis   Cholelithiasis   1.  Possible early cholecystitis -Patient presenting with complaints of right upper quadrant pain with ultrasound showing common bile duct dilatation. This was further worked up with MRCP that did not reveal evidence of choledocholithiasis. Suspect passed stone -Lab showing elevated white count of 15,900, total bili 1.5, alk phos 85.  -She was treated with empiric IV antimicrobial therapy with Zosyn -Case was discussed with general surgery will consult for possible laparoscopic cholecystectomy -On 03/22/2016 she was taken to the operating room where she underwent laparoscopic cholecystectomy. She is being seen post-op, complaining of abdominal pain. Have as needed narcotic analgesics ordered. -03/23/2016 she look better, she got up and ambulated down the hallway. Still having some abdominal soreness. AM labs showing WBC's of 14,000.  -Stopped IV Zosyn, case discussed with Dr Georgette Dover of General Surgery   DVT prophylaxis: SCD's Code Status: Full Code Family Communication: I spoke with multiple family members at  bedside Disposition Plan: Anticipate discharge in the next 24 hours  Consultants:   General Surgery  Procedures:   Laparoscopic cholecystectomy   Antimicrobials:   Zosyn stopped on 03/23/2016  Subjective: She complains of abdominal soreness  Objective: Vitals:   03/22/16 1658 03/23/16 0017 03/23/16 0018 03/23/16 0642  BP: 132/76  (!) 117/55 125/64  Pulse: 75 65 67 69  Resp:   16 18  Temp: 98.5 F (36.9 C)  98.4 F (36.9 C) 98.3 F (36.8 C)  TempSrc: Oral  Oral Oral  SpO2:  99% 99% 100%  Weight:      Height:        Intake/Output Summary (Last 24 hours) at 03/23/16 1410 Last data filed at 03/23/16 0954  Gross per 24 hour  Intake             2360 ml  Output             2925 ml  Net             -565 ml   Filed Weights   03/20/16 1922  Weight: 70.8 kg (156 lb 1.4 oz)    Examination:  General exam: Looks better Respiratory system: Clear to auscultation. Respiratory effort normal. Cardiovascular system: S1 & S2 heard, RRR. No JVD, murmurs, rubs, gallops or clicks. No pedal edema. Gastrointestinal system: Abdominal surgical incision sites appear clean no evidence of hematoma.  Central nervous system: Alert and oriented. No focal neurological deficits. Extremities: Symmetric 5 x 5 power. Skin: No rashes, lesions or ulcers Psychiatry: Judgement and insight appear normal. Mood & affect appropriate.     Data Reviewed: I have personally reviewed following labs and imaging studies  CBC:  Recent Labs Lab 03/20/16 1038  03/21/16 0536 03/23/16 0752  WBC 14.7* 15.9* 14.0*  HGB 13.5 12.5 10.8*  HCT 41.6 38.9 33.9*  MCV 85.2 84.6 86.7  PLT 411* 384 159   Basic Metabolic Panel:  Recent Labs Lab 03/20/16 1028 03/21/16 0536 03/23/16 0752  NA 135 137 141  K 4.7 3.5 3.4*  CL 105 108 112*  CO2 20* 23 21*  GLUCOSE 117* 117* 100*  BUN '12 10 7  ' CREATININE 0.82 0.80 0.79  CALCIUM 9.5 8.9 8.5*   GFR: Estimated Creatinine Clearance: 81.1 mL/min (by C-G formula  based on SCr of 0.79 mg/dL). Liver Function Tests:  Recent Labs Lab 03/20/16 1028 03/21/16 0536  AST 25 17  ALT 17 14  ALKPHOS 100 85  BILITOT 1.1 1.5*  PROT 7.9 6.8  ALBUMIN 4.3 3.6    Recent Labs Lab 03/20/16 1028 03/21/16 0536  LIPASE 16 18   No results for input(s): AMMONIA in the last 168 hours. Coagulation Profile: No results for input(s): INR, PROTIME in the last 168 hours. Cardiac Enzymes: No results for input(s): CKTOTAL, CKMB, CKMBINDEX, TROPONINI in the last 168 hours. BNP (last 3 results) No results for input(s): PROBNP in the last 8760 hours. HbA1C: No results for input(s): HGBA1C in the last 72 hours. CBG: No results for input(s): GLUCAP in the last 168 hours. Lipid Profile: No results for input(s): CHOL, HDL, LDLCALC, TRIG, CHOLHDL, LDLDIRECT in the last 72 hours. Thyroid Function Tests: No results for input(s): TSH, T4TOTAL, FREET4, T3FREE, THYROIDAB in the last 72 hours. Anemia Panel: No results for input(s): VITAMINB12, FOLATE, FERRITIN, TIBC, IRON, RETICCTPCT in the last 72 hours. Sepsis Labs: No results for input(s): PROCALCITON, LATICACIDVEN in the last 168 hours.  Recent Results (from the past 240 hour(s))  Surgical pcr screen     Status: Abnormal   Collection Time: 03/22/16  7:20 AM  Result Value Ref Range Status   MRSA, PCR NEGATIVE NEGATIVE Final   Staphylococcus aureus POSITIVE (A) NEGATIVE Final    Comment:        The Xpert SA Assay (FDA approved for NASAL specimens in patients over 64 years of age), is one component of a comprehensive surveillance program.  Test performance has been validated by [facility] for patients greater than or equal to 45 year old. It is not intended to diagnose infection nor to guide or monitor treatment.          Radiology Studies: Dg Cholangiogram Operative  Result Date: 03/22/2016 CLINICAL DATA:  Intraoperative cholangiogram during laparoscopic cholecystectomy. EXAM: INTRAOPERATIVE  CHOLANGIOGRAM FLUOROSCOPY TIME:  25.7 seconds COMPARISON:  MRCP - 03/20/2016 FINDINGS: Intraoperative cholangiographic images of the right upper abdominal quadrant during laparoscopic cholecystectomy are provided for review. Provided images demonstrate opacification of the neck, body and fundus of the gallbladder. There are multiple filling defects seen throughout the gallbladder compatible with cholelithiasis. There is no definitive opacification of the cystic or common bile ducts. IMPRESSION: 1. Cholelithiasis. 2. Nondiagnostic evaluation for choledocholithiasis given lack of opacification of the cystic or common bile ducts. Further evaluation with ERCP could be performed as indicated. Electronically Signed   By: Sandi Mariscal M.D.   On: 03/22/2016 17:23        Scheduled Meds: . Chlorhexidine Gluconate Cloth  6 each Topical Daily  . famotidine  20 mg Oral BID  . mupirocin ointment  1 application Nasal BID   Continuous Infusions: . sodium chloride 50 mL/hr at 03/23/16 0933  . lactated ringers 10 mL/hr at 03/22/16 1303  LOS: 2 days    Time spent: 25 min    Kelvin Cellar, MD [facility] Pager [phone]  If 7PM-7AM, please contact night-coverage [email or web address] Password [email or web address] 03/23/2016, 2:10 PM

## 2016-03-23 NOTE — Progress Notes (Addendum)
Pharmacy Antibiotic Note  Chelsea Cannon is a 50 y.o. female admitted on 03/20/2016 with possible intra-abdominal infection. Patient reporting  abdominal pain with nausea and vomiting. Gallstones on ultrasound per notes. Pharmacy was consulted for Zosyn dosing. -Afebrile and WBC trending down to 14.0 on 9/29 -SCr 0.79 and stable   Plan: -Continue Zosyn 3.375g IV q8h (4 hour infusion) -Monitor renal function, clinical picture, and culture results  -Follow-up antibiotic duration of therapy  Temp (24hrs), Avg:98.2 F (36.8 C), Min:97.6 F (36.4 C), Max:98.5 F (36.9 C)   Recent Labs Lab 03/20/16 1028 03/20/16 1038 03/21/16 0536 03/23/16 0752  WBC  --  14.7* 15.9* 14.0*  CREATININE 0.82  --  0.80 0.79    CrCl ~ 80-90 mL/min   No Known Allergies  Antimicrobials this admission: 9/28 Zosyn >>   Dose adjustments this admission: N/A   Microbiology results:  9/30 MRSA PCR: positive  Thank you for allowing pharmacy to be a part of this patient's care.  Myer Peer Grayland Ormond), PharmD  PGY1 Pharmacy Resident Pager: [phone] 03/23/2016 10:25 AM

## 2016-03-24 DIAGNOSIS — K8020 Calculus of gallbladder without cholecystitis without obstruction: Secondary | ICD-10-CM

## 2016-03-24 LAB — CBC
HCT: 33.5 % — ABNORMAL LOW (ref 36.0–46.0)
Hemoglobin: 10.9 g/dL — ABNORMAL LOW (ref 12.0–15.0)
MCH: 27.6 pg (ref 26.0–34.0)
MCHC: 32.5 g/dL (ref 30.0–36.0)
MCV: 84.8 fL (ref 78.0–100.0)
PLATELETS: 356 10*3/uL (ref 150–400)
RBC: 3.95 MIL/uL (ref 3.87–5.11)
RDW: 13.8 % (ref 11.5–15.5)
WBC: 12 10*3/uL — ABNORMAL HIGH (ref 4.0–10.5)

## 2016-03-24 MED ORDER — SENNOSIDES-DOCUSATE SODIUM 8.6-50 MG PO TABS
1.0000 | ORAL_TABLET | Freq: Once | ORAL | Status: AC
  Administered 2016-03-24: 1 via ORAL
  Filled 2016-03-24: qty 1

## 2016-03-24 MED ORDER — BISACODYL 10 MG RE SUPP
10.0000 mg | Freq: Once | RECTAL | Status: AC
  Administered 2016-03-24: 10 mg via RECTAL
  Filled 2016-03-24: qty 1

## 2016-03-24 MED ORDER — POLYETHYLENE GLYCOL 3350 17 G PO PACK: 17.0000 g | PACK | Freq: Every day | ORAL | 0 refills | Status: DC

## 2016-03-24 MED ORDER — SENNOSIDES-DOCUSATE SODIUM 8.6-50 MG PO TABS: 1.0000 | ORAL_TABLET | Freq: Once | ORAL | 0 refills | Status: AC

## 2016-03-24 MED ORDER — POLYETHYLENE GLYCOL 3350 17 G PO PACK
17.0000 g | PACK | Freq: Every day | ORAL | Status: DC
  Administered 2016-03-24: 17 g via ORAL
  Filled 2016-03-24: qty 1

## 2016-03-24 MED ORDER — FLEET ENEMA 7-19 GM/118ML RE ENEM
1.0000 | ENEMA | Freq: Every day | RECTAL | Status: DC | PRN
  Administered 2016-03-24: 1 via RECTAL
  Filled 2016-03-24: qty 1

## 2016-03-24 MED ORDER — OXYCODONE HCL 5 MG PO TABS: 5.0000 mg | ORAL_TABLET | Freq: Four times a day (QID) | ORAL | 0 refills | Status: DC | PRN

## 2016-03-24 NOTE — Care Management Note (Signed)
Case Management Note  Patient Details  Name: Chelsea Cannon MRN: [id number] Date of Birth: 1965-09-17  Subjective/Objective:                    Action/Plan: Plan is to d/c to home today. Pt is to f/u with [facility] for post hospital visit appointment.  Expected Discharge Date:   03/24/2016              Expected Discharge Plan:  Home/Self Care  In-House Referral:     Discharge planning Services  CM Consult  Status of Service:  completed  If discussed at Long Length of Stay Meetings, dates discussed:    Additional Comments:  Sharin Mons, RN 03/24/2016, 9:56 AM

## 2016-03-24 NOTE — Progress Notes (Signed)
2 Days Post-Op  Subjective: She has improved. She wants to go home and her husband degrees Ambulating.  Voiding independently.  Appetite not great but tolerating diet without nausea or vomiting Pain controlled  WBC down to 12,000.  Hemoglobin stable at 10.9.2  Objective: Vital signs in last 24 hours: Temp:  [98.2 F (36.8 C)-99.3 F (37.4 C)] 98.2 F (36.8 C) (10/02 0659) Pulse Rate:  [72-91] 72 (10/02 0659) Resp:  [18] 18 (10/01 2155) BP: (119-127)/(53-70) 127/70 (10/02 0659) SpO2:  [97 %-99 %] 99 % (10/02 0659) Last BM Date: 03/21/16  Intake/Output from previous day: 10/01 0701 - 10/02 0700 In: 1040 [P.O.:420; I.V.:620] Out: 4100 [Urine:4100] Intake/Output this shift: Total I/O In: -  Out: 500 [Urine:500]  General appearance: Alert.  No distress.  Slightly fatigued.  Mental status normal.  Husband present Resp: clear to auscultation bilaterally GI: Soft.  Nondistended.  Essentially nontender.  All trocar sites look good.  Benign postop exam.  Lab Results:  Results for orders placed or performed during the hospital encounter of 03/20/16 (from the past 24 hour(s))  CBC     Status: Abnormal   Collection Time: 03/23/16 11:45 PM  Result Value Ref Range   WBC 12.0 (H) 4.0 - 10.5 K/uL   RBC 3.95 3.87 - 5.11 MIL/uL   Hemoglobin 10.9 (L) 12.0 - 15.0 g/dL   HCT 33.5 (L) 36.0 - 46.0 %   MCV 84.8 78.0 - 100.0 fL   MCH 27.6 26.0 - 34.0 pg   MCHC 32.5 30.0 - 36.0 g/dL   RDW 13.8 11.5 - 15.5 %   Platelets 356 150 - 400 K/uL     Studies/Results: No results found.  . Chlorhexidine Gluconate Cloth  6 each Topical Daily  . famotidine  20 mg Oral BID  . mupirocin ointment  1 application Nasal BID     Assessment/Plan: s/p Procedure(s): LAPAROSCOPIC CHOLECYSTECTOMY WITH INTRAOPERATIVE CHOLANGIOGRAM  POD #2.  Laparoscopic cholecystectomy for acute cholecystitis.                  Gallbladder infundibulum transected with GIA stapler due to absence of cystic duct       Doing well                  From our standpoint she may be discharged home today.                  Diet and activities discussed and loaded into after visit summary                  Recommend you give her a 5 day prescription for hydrocodone                  Follow-up with Dr. Georganna Skeans on October 18.  Appointment already made.                    We will sign off.  @PROBHOSP @  LOS: 3 days    Yvonna Brun M 03/24/2016  . .prob

## 2016-03-24 NOTE — Progress Notes (Signed)
Pt given discharge instructions, prescriptions, and care notes. Pt verbalized understanding AEB no further questions or concerns at this time. IV was discontinued, no redness, pain, or swelling noted at this time. Telemetry discontinued and Centralized Telemetry was notified. Pt left the floor via wheelchair with staff in stable condition. 

## 2016-03-24 NOTE — Discharge Summary (Signed)
Physician Discharge Summary  Chelsea Cannon JKK:[id number] DOB: 1965/07/14 DOA: 03/20/2016  PCP: No PCP Per Patient  Admit date: 03/20/2016 Discharge date: 03/24/2016  Time spent: 35 minutes  Recommendations for Outpatient Follow-up:  1. She was treated for cholecystitis undergiong cholecystectomy  2. Will follow up with general surgery   Discharge Diagnoses:  Principal Problem:   Cholelithiasis without cholecystitis Active Problems:   Acute epigastric pain   Common bile duct dilatation   Fatty liver disease, nonalcoholic   Leukocytosis   Cholelithiasis   Discharge Condition: Stable  Diet recommendation: Regular  Filed Weights   03/20/16 1922  Weight: 70.8 kg (156 lb 1.4 oz)    History of present illness:  Chelsea Cannon is a 50 y.o. female without any significant medical history. Patient reports that for the past several days she has noted epigastric pain with eating which worsened yesterday. She felt like these symptoms represented very severe indigestion. Because of this she was afraid to eat. She awakened at 1:30 AM this morning with severe epigastric pain radiating to her left back and shoulder blade region. This pain was constant and very severe in nature level 9-10/10. This was associated with significant nausea and recurrent emesis. She has not noticed any dark urine. She had noticed increase in abdominal pain with eating over the past several days. Patient currently reporting very minimal to 0 abdominal pain.  Hospital Course:  Chelsea Cannon is a pleasant 50 year old female with no stenotic and past medical history presented to the emergency department on 03/12/2016 with complaints of right upper quadrant pain. Initial workup in the emergency room included a right upper quadrant ultrasound that showed dilated common bile duct measuring 13.9 cm. This was further worked up with an MRCP that did not reveal evidence of choledocholithiasis, will ever did reveal the  presence of gallstones and possibly early cholecystitis. General surgery was consulted.   1.  Possible early cholecystitis -Patient presenting with complaints of right upper quadrant pain with ultrasound showing common bile duct dilatation. This was further worked up with MRCP that did not reveal evidence of choledocholithiasis. Suspect passed stone -Lab showing elevated white count of 15,900, total bili 1.5, alk phos 85.  -She was treated with empiric IV antimicrobial therapy with Zosyn -Case was discussed with general surgery will consult for possible laparoscopic cholecystectomy -On 03/22/2016 she was taken to the operating room where she underwent laparoscopic cholecystectomy. She is being seen post-op, complaining of abdominal pain. Have as needed narcotic analgesics ordered. -03/23/2016 she look better, she got up and ambulated down the hallway. Still having some abdominal soreness. AM labs showing WBC's of 14,000.  -Stopped IV Zosyn, case discussed with Dr Georgette Dover of General Surgery -She was discharged home on 03/24/2016 in stable condition  Procedures: Laparoscopic cholecystectomy   Consultations:  General Surgery  Discharge Exam: Vitals:   03/23/16 2155 03/24/16 0659  BP: (!) 119/53 127/70  Pulse: 74 72  Resp: 18   Temp: 98.4 F (36.9 C) 98.2 F (36.8 C)    General exam: Looks better Respiratory system: Clear to auscultation. Respiratory effort normal. Cardiovascular system: S1 & S2 heard, RRR. No JVD, murmurs, rubs, gallops or clicks. No pedal edema. Gastrointestinal system: Abdominal surgical incision sites appear clean no evidence of hematoma.  Central nervous system: Alert and oriented. No focal neurological deficits. Extremities: Symmetric 5 x 5 power. Skin: No rashes, lesions or ulcers Psychiatry: Judgement and insight appear normal. Mood & affect appropriate.   Discharge Instructions   Discharge  Instructions    Call MD for:    Complete by:  As directed    Call  MD for:  difficulty breathing, headache or visual disturbances    Complete by:  As directed    Call MD for:  extreme fatigue    Complete by:  As directed    Call MD for:  hives    Complete by:  As directed    Call MD for:  persistant dizziness or light-headedness    Complete by:  As directed    Call MD for:  persistant nausea and vomiting    Complete by:  As directed    Call MD for:  redness, tenderness, or signs of infection (pain, swelling, redness, odor or green/yellow discharge around incision site)    Complete by:  As directed    Call MD for:  severe uncontrolled pain    Complete by:  As directed    Call MD for:  temperature >100.4    Complete by:  As directed    Diet - low sodium heart healthy    Complete by:  As directed    Increase activity slowly    Complete by:  As directed      Current Discharge Medication List    START taking these medications   Details  oxyCODONE (OXY IR/ROXICODONE) 5 MG immediate release tablet Take 1 tablet (5 mg total) by mouth every 6 (six) hours as needed for moderate pain or severe pain. Qty: 15 tablet, Refills: 0    polyethylene glycol (MIRALAX / GLYCOLAX) packet Take 17 g by mouth daily. Qty: 14 each, Refills: 0    senna-docusate (SENOKOT-S) 8.6-50 MG tablet Take 1 tablet by mouth once. Qty: 1 tablet, Refills: 0      CONTINUE these medications which have NOT CHANGED   Details  Famotidine (PEPCID PO) Take 2 tablets by mouth daily as needed (heartburn).    ranitidine (ZANTAC) 150 MG tablet Take 150 mg by mouth daily as needed for heartburn.      STOP taking these medications     ibuprofen (ADVIL,MOTRIN) 600 MG tablet        No Known Allergies Follow-up Information    Zenovia Jarred, MD .   Specialty:  General Surgery Why:  Your appointment is 04/09/2016 at 9:20am, please arrive at least 30 min before your appointment to complete your check in paperwork.  If you are unable to arrive 30 min prior to your appointment time we may  have to cancel or reschedule you. Contact information: [place] [address] [phone]            The results of significant diagnostics from this hospitalization (including imaging, microbiology, ancillary and laboratory) are listed below for reference.    Significant Diagnostic Studies: Dg Chest 2 View  Result Date: 03/20/2016 CLINICAL DATA:  Chest pain since this morning. EXAM: CHEST  2 VIEW COMPARISON:  None. FINDINGS: The heart size and mediastinal contours are within normal limits. Both lungs are clear. The visualized skeletal structures are unremarkable. IMPRESSION: No active cardiopulmonary disease. Electronically Signed   By: Marin Olp M.D.   On: 03/20/2016 11:13   Dg Cholangiogram Operative  Result Date: 03/22/2016 CLINICAL DATA:  Intraoperative cholangiogram during laparoscopic cholecystectomy. EXAM: INTRAOPERATIVE CHOLANGIOGRAM FLUOROSCOPY TIME:  25.7 seconds COMPARISON:  MRCP - 03/20/2016 FINDINGS: Intraoperative cholangiographic images of the right upper abdominal quadrant during laparoscopic cholecystectomy are provided for review. Provided images demonstrate opacification of the neck, body and  fundus of the gallbladder. There are multiple filling defects seen throughout the gallbladder compatible with cholelithiasis. There is no definitive opacification of the cystic or common bile ducts. IMPRESSION: 1. Cholelithiasis. 2. Nondiagnostic evaluation for choledocholithiasis given lack of opacification of the cystic or common bile ducts. Further evaluation with ERCP could be performed as indicated. Electronically Signed   By: Sandi Mariscal M.D.   On: 03/22/2016 17:23   Mr 3d Recon At Scanner  Result Date: 03/20/2016 CLINICAL DATA:  Evaluate common bile duct dilatation. Epigastric pain. EXAM: MRI ABDOMEN WITHOUT AND WITH CONTRAST (INCLUDING MRCP) TECHNIQUE: Multiplanar multisequence MR imaging of the abdomen was performed both before and after the  administration of intravenous contrast. Heavily T2-weighted images of the biliary and pancreatic ducts were obtained, and three-dimensional MRCP images were rendered by post processing. CONTRAST:  73m MULTIHANCE GADOBENATE DIMEGLUMINE 529 MG/ML IV SOLN COMPARISON:  None. FINDINGS: Lower chest: No acute findings. Hepatobiliary: Mild hepatic steatosis. 9 mm enhancing lesion within the posterior right lobe of liver is identified, image 42 of series 16004. Likely benign hemangioma. Within the lateral right lobe of liver there is a hemangioma measuring 1.2 cm, image 37 of series 1604. No intrahepatic bile duct dilatation. Numerous stones are identified within the gallbladder. These measure up to 6 mm. Mild wall thickening of the gallbladder. Within the cystic duct there is a signal void measuring 7 mm, image 20 of series 9. The common bile duct measures 8 mm. No choledocholithiasis. Pancreas: No mass, inflammatory changes, or other parenchymal abnormality identified. Spleen:  Within normal limits in size and appearance. Adrenals/Urinary Tract: Normal appearance of the adrenal glands. Small right kidney cysts noted. No mass or hydronephrosis identified.The left kidney is unremarkable. Stomach/Bowel: The stomach is normal. There is no pathologic dilatation of the upper abdominal bowel loops. Vascular/Lymphatic: No pathologically enlarged lymph nodes identified. No abdominal aortic aneurysm demonstrated. Other:  None. Musculoskeletal: No suspicious bone lesions identified. IMPRESSION: 1. Increase caliber of the common bowel duct without evidence for choledocholithiasis. 2. Gallstones. Cystic duct stone is suspected. There is also mild a thickening involving the gallbladder wall. Cannot rule out early cholecystitis. 3. Mild hepatic steatosis. 4. Liver hemangiomas. Electronically Signed   By: TKerby MoorsM.D.   On: 03/20/2016 19:30   Mr AJeananne RamaW/wo Cm/mrcp  Result Date: 03/20/2016 CLINICAL DATA:  Evaluate common bile  duct dilatation. Epigastric pain. EXAM: MRI ABDOMEN WITHOUT AND WITH CONTRAST (INCLUDING MRCP) TECHNIQUE: Multiplanar multisequence MR imaging of the abdomen was performed both before and after the administration of intravenous contrast. Heavily T2-weighted images of the biliary and pancreatic ducts were obtained, and three-dimensional MRCP images were rendered by post processing. CONTRAST:  120mMULTIHANCE GADOBENATE DIMEGLUMINE 529 MG/ML IV SOLN COMPARISON:  None. FINDINGS: Lower chest: No acute findings. Hepatobiliary: Mild hepatic steatosis. 9 mm enhancing lesion within the posterior right lobe of liver is identified, image 42 of series 16004. Likely benign hemangioma. Within the lateral right lobe of liver there is a hemangioma measuring 1.2 cm, image 37 of series 1604. No intrahepatic bile duct dilatation. Numerous stones are identified within the gallbladder. These measure up to 6 mm. Mild wall thickening of the gallbladder. Within the cystic duct there is a signal void measuring 7 mm, image 20 of series 9. The common bile duct measures 8 mm. No choledocholithiasis. Pancreas: No mass, inflammatory changes, or other parenchymal abnormality identified. Spleen:  Within normal limits in size and appearance. Adrenals/Urinary Tract: Normal appearance of the adrenal glands. Small right kidney cysts  noted. No mass or hydronephrosis identified.The left kidney is unremarkable. Stomach/Bowel: The stomach is normal. There is no pathologic dilatation of the upper abdominal bowel loops. Vascular/Lymphatic: No pathologically enlarged lymph nodes identified. No abdominal aortic aneurysm demonstrated. Other:  None. Musculoskeletal: No suspicious bone lesions identified. IMPRESSION: 1. Increase caliber of the common bowel duct without evidence for choledocholithiasis. 2. Gallstones. Cystic duct stone is suspected. There is also mild a thickening involving the gallbladder wall. Cannot rule out early cholecystitis. 3. Mild  hepatic steatosis. 4. Liver hemangiomas. Electronically Signed   By: Kerby Moors M.D.   On: 03/20/2016 19:30   US Abdomen Limited Ruq  Result Date: 03/20/2016 CLINICAL DATA:  Epigastric pain EXAM: US ABDOMEN LIMITED - RIGHT UPPER QUADRANT COMPARISON:  None. FINDINGS: Gallbladder: The gallbladder is well seen and there are gallstones present the largest of 1.4 cm with some gallbladder sludge present as well. There is no pain over the gallbladder with compression. Common bile duct: Diameter: The common bile duct is dilated measuring 13.9 cm. Assessment of the common bile duct is limited due to shadowing from overlying gallstones. A distal common bowel duct calculus, stricture, or mass is suspected in view of the dilatation of the common bile duct. Consider CT or preferably MR with MRCP to assess further. Liver: The liver somewhat echogenic suggesting mild fatty infiltration. No focal hepatic abnormality is seen. IMPRESSION: 1. Dilated common bile duct. Consider distal common bile duct calculus, stricture, or mass. Recommend CT or preferably MRI with MRCP to evaluate further. 2. Gallstones with gallbladder sludge. 3. Suspect mild fatty infiltration of the liver. Electronically Signed   By: Ivar Drape M.D.   On: 03/20/2016 16:17    Microbiology: Recent Results (from the past 240 hour(s))  Surgical pcr screen     Status: Abnormal   Collection Time: 03/22/16  7:20 AM  Result Value Ref Range Status   MRSA, PCR NEGATIVE NEGATIVE Final   Staphylococcus aureus POSITIVE (A) NEGATIVE Final    Comment:        The Xpert SA Assay (FDA approved for NASAL specimens in patients over 34 years of age), is one component of a comprehensive surveillance program.  Test performance has been validated by [facility] for patients greater than or equal to 48 year old. It is not intended to diagnose infection nor to guide or monitor treatment.      Labs: Basic Metabolic Panel:  Recent Labs Lab 03/20/16 1028  03/21/16 0536 03/23/16 0752  NA 135 137 141  K 4.7 3.5 3.4*  CL 105 108 112*  CO2 20* 23 21*  GLUCOSE 117* 117* 100*  BUN '12 10 7  ' CREATININE 0.82 0.80 0.79  CALCIUM 9.5 8.9 8.5*   Liver Function Tests:  Recent Labs Lab 03/20/16 1028 03/21/16 0536  AST 25 17  ALT 17 14  ALKPHOS 100 85  BILITOT 1.1 1.5*  PROT 7.9 6.8  ALBUMIN 4.3 3.6    Recent Labs Lab 03/20/16 1028 03/21/16 0536  LIPASE 16 18   No results for input(s): AMMONIA in the last 168 hours. CBC:  Recent Labs Lab 03/20/16 1038 03/21/16 0536 03/23/16 0752 03/23/16 2345  WBC 14.7* 15.9* 14.0* 12.0*  HGB 13.5 12.5 10.8* 10.9*  HCT 41.6 38.9 33.9* 33.5*  MCV 85.2 84.6 86.7 84.8  PLT 411* 384 349 356   Cardiac Enzymes: No results for input(s): CKTOTAL, CKMB, CKMBINDEX, TROPONINI in the last 168 hours. BNP: BNP (last 3 results) No results for input(s): BNP in the  last 8760 hours.  ProBNP (last 3 results) No results for input(s): PROBNP in the last 8760 hours.  CBG: No results for input(s): GLUCAP in the last 168 hours.     Signed:  Kelvin Cellar MD.  [facility] 03/24/2016, 12:00 PM

## 2016-03-24 NOTE — Discharge Instructions (Signed)
[facility] ______[facility] °LAPAROSCOPIC SURGERY: POST OP INSTRUCTIONS °Always review your discharge instruction sheet given to you by the facility where your surgery was performed. °IF YOU HAVE DISABILITY OR FAMILY LEAVE FORMS, YOU MUST BRING THEM TO THE OFFICE FOR PROCESSING.   °DO NOT GIVE THEM TO YOUR DOCTOR. ° °1. A prescription for pain medication may be given to you upon discharge.  Take your pain medication as prescribed, if needed.  If narcotic pain medicine is not needed, then you may take acetaminophen (Tylenol) or ibuprofen (Advil) as needed. °2. Take your usually prescribed medications unless otherwise directed. °3. If you need a refill on your pain medication, please contact your pharmacy.  They will contact our office to request authorization. Prescriptions will not be filled after 5pm or on week-ends. °4. You should follow a light diet the first few days after arrival home, such as soup and crackers, etc.  Be sure to include lots of fluids daily. °5. Most patients will experience some swelling and bruising in the area of the incisions.  Ice packs will help.  Swelling and bruising can take several days to resolve.  °6. It is common to experience some constipation if taking pain medication after surgery.  Increasing fluid intake and taking a stool softener (such as Colace) will usually help or prevent this problem from occurring.  A mild laxative (Milk of Magnesia or Miralax) should be taken according to package instructions if there are no bowel movements after 48 hours. °7. Unless discharge instructions indicate otherwise, you may remove your bandages 24-48 hours after surgery, and you may shower at that time.  You may have steri-strips (small skin tapes) in place directly over the incision.  These strips should be left on the skin for 7-10 days.  If your surgeon used skin glue on the incision, you may shower in 24 hours.  The glue will flake off over the next 2-3 weeks.  Any sutures or  staples will be removed at the office during your follow-up visit. °8. ACTIVITIES:  You may resume regular (light) daily activities beginning the next day--such as daily self-care, walking, climbing stairs--gradually increasing activities as tolerated.  You may have sexual intercourse when it is comfortable.  Refrain from any heavy lifting or straining until approved by your doctor. °a. You may drive when you are no longer taking prescription pain medication, you can comfortably wear a seatbelt, and you can safely maneuver your car and apply brakes. °b. RETURN TO WORK:  __________________________________________________________ °9. You should see your doctor in the office for a follow-up appointment approximately 2-3 weeks after your surgery.  Make sure that you call for this appointment within a day or two after you arrive home to insure a convenient appointment time. °10. OTHER INSTRUCTIONS: __________________________________________________________________________________________________________________________ __________________________________________________________________________________________________________________________ °WHEN TO CALL YOUR DOCTOR: °1. Fever over 101.0 °2. Inability to urinate °3. Continued bleeding from incision. °4. Increased pain, redness, or drainage from the incision. °5. Increasing abdominal pain ° °The clinic staff is available to answer your questions during regular business hours.  Please don’t hesitate to call and ask to speak to one of the nurses for clinical concerns.  If you have a medical emergency, go to the nearest emergency room or call 911.  A surgeon from [facility] is always on call at the hospital. °[address], [address], [place], [place]  [address] ? [address] [address], [place],    [address] °[phone] ? [phone] ? FAX [phone] °Web site:   [email or web address] ______[facility] LAPAROSCOPIC  SURGERY: POST OP INSTRUCTIONS Always review your discharge instruction sheet given to you by the facility where your surgery was performed. IF YOU HAVE DISABILITY OR FAMILY LEAVE FORMS, YOU MUST BRING THEM TO THE OFFICE FOR PROCESSING.   DO NOT GIVE THEM TO YOUR DOCTOR.  11. A prescription for pain medication may be given to you upon discharge.  Take your pain medication as prescribed, if needed.  If narcotic pain medicine is not needed, then you may take acetaminophen (Tylenol) or ibuprofen (Advil) as needed. 12. Take your usually prescribed medications unless otherwise directed. 13. If you need a refill on your pain medication, please contact your pharmacy.  They will contact our office to request authorization. Prescriptions will not be filled after 5pm or on week-ends. 14. You should follow a light diet the first few days after arrival home, such as soup and crackers, etc.  Be sure to include lots of fluids daily. 15. Most patients will experience some swelling and bruising in the area of the incisions.  Ice packs will help.  Swelling and bruising can take several days to resolve.  16. It is common to experience some constipation if taking pain medication after surgery.  Increasing fluid intake and taking a stool softener (such as Colace) will usually help or prevent this problem from occurring.  A mild laxative (Milk of Magnesia or Miralax) should be taken according to package instructions if there are no bowel movements after 48 hours. 17. Unless discharge instructions indicate otherwise, you may remove your bandages 24-48 hours after surgery, and you may shower at that time.  You may have steri-strips (small skin tapes) in place directly over the incision.  These strips should be left on the skin for 7-10 days.  If your surgeon used skin glue on the incision, you may shower in 24 hours.  The glue will flake off over the next 2-3 weeks.  Any sutures or staples will be removed at the office during  your follow-up visit. 18. ACTIVITIES:  You may resume regular (light) daily activities beginning the next day--such as daily self-care, walking, climbing stairs--gradually increasing activities as tolerated.  You may have sexual intercourse when it is comfortable.  Refrain from any heavy lifting or straining until approved by your doctor. a. You may drive when you are no longer taking prescription pain medication, you can comfortably wear a seatbelt, and you can safely maneuver your car and apply brakes. b. RETURN TO WORK:  __________________________________________________________ 19. You should see your doctor in the office for a follow-up appointment approximately 2-3 weeks after your surgery.  Make sure that you call for this appointment within a day or two after you arrive home to insure a convenient appointment time. 20. OTHER INSTRUCTIONS: __________________________________________________________________________________________________________________________ __________________________________________________________________________________________________________________________ WHEN TO CALL YOUR DOCTOR: 6. Fever over 101.0 7. Inability to urinate 8. Continued bleeding from incision. 9. Increased pain, redness, or drainage from the incision. 10. Increasing abdominal pain  The clinic staff is available to answer your questions during regular business hours.  Please dont hesitate to call and ask to speak to one of the nurses for clinical concerns.  If you have a medical emergency, go to the nearest emergency room or call 911.  A surgeon from [facility] is always on call at the hospital. [address], [place], [place], [place]  [address] ? [address], [place], [place]   [address] [phone] ? [phone] ? FAX [phone] Web  site: [email or web address] ______[facility] LAPAROSCOPIC SURGERY: POST OP INSTRUCTIONS Always review your  discharge instruction sheet given to you by the facility where your surgery was performed. IF YOU HAVE DISABILITY OR FAMILY LEAVE FORMS, YOU MUST BRING THEM TO THE OFFICE FOR PROCESSING.   DO NOT GIVE THEM TO YOUR DOCTOR.  21. A prescription for pain medication may be given to you upon discharge.  Take your pain medication as prescribed, if needed.  If narcotic pain medicine is not needed, then you may take acetaminophen (Tylenol) or ibuprofen (Advil) as needed. 22. Take your usually prescribed medications unless otherwise directed. 23. If you need a refill on your pain medication, please contact your pharmacy.  They will contact our office to request authorization. Prescriptions will not be filled after 5pm or on week-ends. 24. You should follow a light diet the first few days after arrival home, such as soup and crackers, etc.  Be sure to include lots of fluids daily. 25. Most patients will experience some swelling and bruising in the area of the incisions.  Ice packs will help.  Swelling and bruising can take several days to resolve.  26. It is common to experience some constipation if taking pain medication after surgery.  Increasing fluid intake and taking a stool softener (such as Colace) will usually help or prevent this problem from occurring.  A mild laxative (Milk of Magnesia or Miralax) should be taken according to package instructions if there are no bowel movements after 48 hours. 27. Unless discharge instructions indicate otherwise, you may remove your bandages 24-48 hours after surgery, and you may shower at that time.  You may have steri-strips (small skin tapes) in place directly over the incision.  These strips should be left on the skin for 7-10 days.  If your surgeon used skin glue on the incision, you may shower in 24 hours.  The glue will flake off over the next 2-3 weeks.  Any sutures or staples will be removed at the office during your follow-up visit. 28. ACTIVITIES:  You may  resume regular (light) daily activities beginning the next day--such as daily self-care, walking, climbing stairs--gradually increasing activities as tolerated.  You may have sexual intercourse when it is comfortable.  Refrain from any heavy lifting or straining until approved by your doctor. a. You may drive when you are no longer taking prescription pain medication, you can comfortably wear a seatbelt, and you can safely maneuver your car and apply brakes. b. RETURN TO WORK:  __________________________________________________________ 29. You should see your doctor in the office for a follow-up appointment approximately 2-3 weeks after your surgery.  Make sure that you call for this appointment within a day or two after you arrive home to insure a convenient appointment time. 30. OTHER INSTRUCTIONS: __________________________________________________________________________________________________________________________ __________________________________________________________________________________________________________________________ WHEN TO CALL YOUR DOCTOR: 11. Fever over 101.0 12. Inability to urinate 13. Continued bleeding from incision. 14. Increased pain, redness, or drainage from the incision. 15. Increasing abdominal pain  The clinic staff is available to answer your questions during regular business hours.  Please dont hesitate to call and ask to speak to one of the nurses for clinical concerns.  If you have a medical emergency, go to the nearest emergency room or call 911.  A surgeon from [facility] is always on call at the hospital. [address], [place], [place], [place]  [address] ? [address], [place], [place]   [address] [phone] ? [phone] ? FAX [phone]  Web site: [email or web address]

## 2016-04-16 DIAGNOSIS — K819 Cholecystitis, unspecified: Secondary | ICD-10-CM

## 2016-04-16 DIAGNOSIS — R112 Nausea with vomiting, unspecified: Secondary | ICD-10-CM

## 2016-08-07 ENCOUNTER — Encounter (HOSPITAL_COMMUNITY): Payer: Self-pay

## 2016-08-07 ENCOUNTER — Inpatient Hospital Stay (HOSPITAL_COMMUNITY): Payer: Medicaid Other

## 2016-08-07 ENCOUNTER — Inpatient Hospital Stay (HOSPITAL_COMMUNITY)
Admission: EM | Admit: 2016-08-07 | Discharge: 2016-08-10 | DRG: 444 | Disposition: A | Discharge status: Home / Self Care | Payer: Medicaid Other | Attending: Internal Medicine | Admitting: Internal Medicine

## 2016-08-07 DIAGNOSIS — N920 Excessive and frequent menstruation with regular cycle: Secondary | ICD-10-CM | POA: Diagnosis present

## 2016-08-07 DIAGNOSIS — Z9049 Acquired absence of other specified parts of digestive tract: Secondary | ICD-10-CM

## 2016-08-07 DIAGNOSIS — K859 Acute pancreatitis without necrosis or infection, unspecified: Secondary | ICD-10-CM | POA: Diagnosis not present

## 2016-08-07 DIAGNOSIS — R17 Unspecified jaundice: Secondary | ICD-10-CM

## 2016-08-07 DIAGNOSIS — J45909 Unspecified asthma, uncomplicated: Secondary | ICD-10-CM

## 2016-08-07 DIAGNOSIS — Z79899 Other long term (current) drug therapy: Secondary | ICD-10-CM

## 2016-08-07 DIAGNOSIS — K8050 Calculus of bile duct without cholangitis or cholecystitis without obstruction: Secondary | ICD-10-CM

## 2016-08-07 DIAGNOSIS — D1803 Hemangioma of intra-abdominal structures: Secondary | ICD-10-CM | POA: Diagnosis present

## 2016-08-07 DIAGNOSIS — K838 Other specified diseases of biliary tract: Secondary | ICD-10-CM

## 2016-08-07 DIAGNOSIS — R7401 Elevation of levels of liver transaminase levels: Secondary | ICD-10-CM

## 2016-08-07 DIAGNOSIS — K8051 Calculus of bile duct without cholangitis or cholecystitis with obstruction: Secondary | ICD-10-CM | POA: Diagnosis present

## 2016-08-07 DIAGNOSIS — K760 Fatty (change of) liver, not elsewhere classified: Secondary | ICD-10-CM | POA: Diagnosis present

## 2016-08-07 DIAGNOSIS — R945 Abnormal results of liver function studies: Secondary | ICD-10-CM

## 2016-08-07 DIAGNOSIS — R740 Nonspecific elevation of levels of transaminase and lactic acid dehydrogenase [LDH]: Secondary | ICD-10-CM

## 2016-08-07 DIAGNOSIS — D649 Anemia, unspecified: Secondary | ICD-10-CM

## 2016-08-07 DIAGNOSIS — R7989 Other specified abnormal findings of blood chemistry: Secondary | ICD-10-CM

## 2016-08-07 HISTORY — DX: Anemia, unspecified: D64.9

## 2016-08-07 HISTORY — DX: Unspecified jaundice: R17

## 2016-08-07 HISTORY — DX: Excessive and frequent menstruation with regular cycle: N92.0

## 2016-08-07 LAB — CBC WITH DIFFERENTIAL/PLATELET
BASOS ABS: 0 10*3/uL (ref 0.0–0.1)
Basophils Relative: 0 %
EOS ABS: 0.2 10*3/uL (ref 0.0–0.7)
Eosinophils Relative: 2 %
HEMATOCRIT: 35.6 % — AB (ref 36.0–46.0)
HEMOGLOBIN: 11.8 g/dL — AB (ref 12.0–15.0)
Lymphocytes Relative: 27 %
Lymphs Abs: 2.1 10*3/uL (ref 0.7–4.0)
MCH: 27.3 pg (ref 26.0–34.0)
MCHC: 33.1 g/dL (ref 30.0–36.0)
MCV: 82.4 fL (ref 78.0–100.0)
MONOS PCT: 8 %
Monocytes Absolute: 0.6 10*3/uL (ref 0.1–1.0)
NEUTROS ABS: 4.8 10*3/uL (ref 1.7–7.7)
NEUTROS PCT: 63 %
Platelets: 478 10*3/uL — ABNORMAL HIGH (ref 150–400)
RBC: 4.32 MIL/uL (ref 3.87–5.11)
RDW: 15.5 % (ref 11.5–15.5)
WBC: 7.8 10*3/uL (ref 4.0–10.5)

## 2016-08-07 LAB — PROTIME-INR
INR: 1.03
PROTHROMBIN TIME: 13.6 s (ref 11.4–15.2)

## 2016-08-07 LAB — HEPATIC FUNCTION PANEL
ALBUMIN: 3.9 g/dL (ref 3.5–5.0)
ALT: 1171 U/L — ABNORMAL HIGH (ref 14–54)
AST: 895 U/L — AB (ref 15–41)
Alkaline Phosphatase: 587 U/L — ABNORMAL HIGH (ref 38–126)
Bilirubin, Direct: 5.5 mg/dL — ABNORMAL HIGH (ref 0.1–0.5)
Indirect Bilirubin: 3.6 mg/dL — ABNORMAL HIGH (ref 0.3–0.9)
TOTAL PROTEIN: 8.1 g/dL (ref 6.5–8.1)
Total Bilirubin: 9.1 mg/dL — ABNORMAL HIGH (ref 0.3–1.2)

## 2016-08-07 LAB — BASIC METABOLIC PANEL
ANION GAP: 10 (ref 5–15)
BUN: 5 mg/dL — ABNORMAL LOW (ref 6–20)
CALCIUM: 10.1 mg/dL (ref 8.9–10.3)
CHLORIDE: 104 mmol/L (ref 101–111)
CO2: 24 mmol/L (ref 22–32)
CREATININE: 0.83 mg/dL (ref 0.44–1.00)
GFR calc non Af Amer: >60 mL/min (ref >60)
Glucose, Bld: 110 mg/dL — ABNORMAL HIGH (ref 65–99)
Potassium: 4 mmol/L (ref 3.5–5.1)
SODIUM: 138 mmol/L (ref 135–145)

## 2016-08-07 LAB — I-STAT BETA HCG BLOOD, ED (MC, WL, AP ONLY)
I-stat hCG, quantitative: 6.6 m[IU]/mL — ABNORMAL HIGH (ref <5)
I-stat hCG, quantitative: <5 m[IU]/mL (ref <5)

## 2016-08-07 LAB — BILIRUBIN, DIRECT: BILIRUBIN DIRECT: 5.4 mg/dL — AB (ref 0.1–0.5)

## 2016-08-07 MED ORDER — ACETAMINOPHEN 325 MG PO TABS: 650.0000 mg | ORAL_TABLET | Freq: Four times a day (QID) | ORAL | Status: DC | PRN

## 2016-08-07 MED ORDER — SODIUM CHLORIDE 0.9 % IV SOLN: Freq: Once | INTRAVENOUS | Status: DC

## 2016-08-07 MED ORDER — BISACODYL 10 MG RE SUPP: 10.0000 mg | Freq: Every day | RECTAL | Status: DC | PRN

## 2016-08-07 MED ORDER — MAGNESIUM CITRATE PO SOLN: 1.0000 | Freq: Once | ORAL | Status: DC | PRN

## 2016-08-07 MED ORDER — FAMOTIDINE 20 MG PO TABS
20.0000 mg | ORAL_TABLET | Freq: Every day | ORAL | Status: DC
  Administered 2016-08-08 – 2016-08-10 (×3): 20 mg via ORAL
  Filled 2016-08-07 (×3): qty 1

## 2016-08-07 MED ORDER — ALBUTEROL SULFATE (2.5 MG/3ML) 0.083% IN NEBU: 2.5000 mg | INHALATION_SOLUTION | RESPIRATORY_TRACT | Status: DC | PRN

## 2016-08-07 MED ORDER — SENNOSIDES-DOCUSATE SODIUM 8.6-50 MG PO TABS
1.0000 | ORAL_TABLET | Freq: Every evening | ORAL | Status: DC | PRN
  Filled 2016-08-07: qty 1

## 2016-08-07 MED ORDER — SODIUM CHLORIDE 0.9 % IV SOLN
INTRAVENOUS | Status: DC
  Administered 2016-08-07 – 2016-08-10 (×4): via INTRAVENOUS

## 2016-08-07 MED ORDER — KCL IN DEXTROSE-NACL 20-5-0.45 MEQ/L-%-% IV SOLN: INTRAVENOUS | Status: DC

## 2016-08-07 MED ORDER — ONDANSETRON HCL 4 MG PO TABS: 4.0000 mg | ORAL_TABLET | Freq: Four times a day (QID) | ORAL | Status: DC | PRN

## 2016-08-07 MED ORDER — HYDROCODONE-ACETAMINOPHEN 5-325 MG PO TABS
1.0000 | ORAL_TABLET | ORAL | Status: DC | PRN
  Administered 2016-08-08 – 2016-08-10 (×5): 2 via ORAL
  Filled 2016-08-07 (×5): qty 2

## 2016-08-07 MED ORDER — GADOBENATE DIMEGLUMINE 529 MG/ML IV SOLN
15.0000 mL | Freq: Once | INTRAVENOUS | Status: AC
  Administered 2016-08-07: 15 mL via INTRAVENOUS

## 2016-08-07 MED ORDER — TRAZODONE HCL 50 MG PO TABS
25.0000 mg | ORAL_TABLET | Freq: Every evening | ORAL | Status: DC | PRN
  Administered 2016-08-08: 25 mg via ORAL
  Filled 2016-08-07: qty 1

## 2016-08-07 MED ORDER — ACETAMINOPHEN 650 MG RE SUPP: 650.0000 mg | Freq: Four times a day (QID) | RECTAL | Status: DC | PRN

## 2016-08-07 MED ORDER — ONDANSETRON HCL 4 MG/2ML IJ SOLN: 4.0000 mg | Freq: Four times a day (QID) | INTRAMUSCULAR | Status: DC | PRN

## 2016-08-07 NOTE — ED Triage Notes (Signed)
Pt presents with onset of malaise and weakness since Feb. 2.  Pt seen at [facility] on Tuesday with labs drawn with ultrasound today which found a blockage in hepatic duct.

## 2016-08-07 NOTE — Progress Notes (Signed)
1700 received pt from ED via wheelchair. A&O x4, skin is jaundice, yellow sclera. Denies abd pain. Kept NPO for MRCP tonight.

## 2016-08-07 NOTE — Consult Note (Signed)
[facility] Consult: 3:09 PM 08/07/2016  LOS: 0 days    Referring Provider: Dr Karren Cobble in ED  Primary Care Physician:  [facility] Primary Gastroenterologist:  unassigned     Reason for Consultation:  Painless jaundice.   HPI: Chelsea Cannon is a 51 y.o. female.  PMH asthma. Asthma. Status post uneventful laparoscopic cholecystectomy after presenting with acute cholecystitis 02/2016.  On the preoperative ultrasound she had 13.9 mm bile duct, gallstones and sludge and mild fatty liver.  MRCP showed mild fatty liver, liver hemangiomas.  GB stones and mild GB wall thickening.  8 mm CBD, no CBD stones.  Her LFTs were normal.  Recent medical ocurrences include about 4 weeks of menstrual bleeding during the month of January. She stopped bleeding this past Monday, 2/12.  Other than that she hasn't had any unusual bleeding and up until that time she hadn't had irregular menses or excessive bleeding.  On 2/2 she had right upper quadrant pain reminiscent of when her gallbladder was giving her problems. This subsided within 24 hours. She did not have nausea or vomiting. Since then she's had fatigue and varying appetite, anorexia some days, able to eat normally other days. No nausea or vomiting. Over the weekend her husband commented that she looked yellow. She was having very yellow looking urine. She had itching all over. She rarely uses Aleve, doesn't use Tylenol. Is on no regular meds. Not on oral contraceptives. Doesn't drink alcohol.  No sore throat. No myalgias. Earlier this week she went to the [facility] and they obtained lab work. Total bilirubin was 9.8 with direct bilirubin of 6.1. Alkaline phosphatase 549.  AST 930.  Lab unable to report on ALT level. Hepatitis A/B/C serologies  negative. Ultrasound was set up and performed today at [facility].  This showed a 1.3 centimeter CBD, possibly reflecting postcholecystectomy change a 1.8 x 1.5 x 1.1 cm cystic structure adjacent to the CBD in the gallbladder fossa.   LFTs were repeated today after she was sent to the emergency department and total bilirubin is 9.1, direct bilirubin 5.5. alkaline phosphatase 587. AST 895, ALT 1171. Pro time 13.6, INR 1.0.       Past Medical History:  Diagnosis Date  . Asthma     Past Surgical History:  Procedure Laterality Date  . CHOLECYSTECTOMY N/A 03/22/2016   Procedure: LAPAROSCOPIC CHOLECYSTECTOMY WITH INTRAOPERATIVE CHOLANGIOGRAM;  Surgeon: Georganna Skeans, MD;  Location: [facility];  Service: General;  Laterality: N/A;    Prior to Admission medications   Medication Sig Start Date End Date Taking? Authorizing Provider  Famotidine (PEPCID PO) Take 2 tablets by mouth daily as needed (heartburn).    Historical Provider, MD  oxyCODONE (OXY IR/ROXICODONE) 5 MG immediate release tablet Take 1 tablet (5 mg total) by mouth every 6 (six) hours as needed for moderate pain or severe pain. Patient not taking: Reported on 08/07/2016 03/24/16   Kelvin Cellar, MD  polyethylene glycol [facility] / GLYCOLAX) packet Take 17 g by mouth daily. Patient not taking: Reported on 08/07/2016 03/24/16  Kelvin Cellar, MD  ranitidine (ZANTAC) 150 MG tablet Take 150 mg by mouth daily as needed for heartburn.    Historical Provider, MD    Scheduled Meds:  Infusions:  PRN Meds:    Allergies as of 08/07/2016  . (No Known Allergies)    History reviewed. No pertinent family history.  Social History   Social History  . Marital status: Married    Spouse name: N/A  . Number of children: N/A  . Years of education: N/A   Occupational History  . Not on file.   Social History Main Topics  . Smoking status: Never Smoker  . Smokeless tobacco: Never Used  . Alcohol use No  . Drug use: No  .  Sexual activity: Not on file   Other Topics Concern  . Not on file   Social History Narrative  . No narrative on file    REVIEW OF SYSTEMS: Constitutional:  Per HPI ENT:  No nose bleeds Pulm:  No cough. No shortness of breath. CV:  No palpitations, no LE edema. No chest pain GU:  No hematuria, no frequency.  Urine is very yellow but not Amber-colored. GI:  No dysphagia. No heartburn. No previous encounters with GI specialists. Heme:  No unusual bleeding or bruising.   Transfusions:  None in the past. Neuro:  No headaches, no peripheral tingling or numbness Derm:  No itching, no rash or sores.  Endocrine:  No sweats or chills.  No polyuria or dysuria Immunization:  She did not get a flu shot this year. Travel:  None beyond local counties in last few months.    PHYSICAL EXAM: Vital signs in last 24 hours: Vitals:   08/07/16 1114 08/07/16 1354  BP: 136/97 113/66  Pulse: 96 88  Resp: 18 16  Temp: 97.9 F (36.6 C)    Wt Readings from Last 3 Encounters:  08/07/16 71.2 kg (157 lb)  03/20/16 70.8 kg (156 lb 1.4 oz)  02/12/13 63.5 kg (140 lb)    General: Other than scleral icterus and mild jaundice, she looks well. She is comfortable. She is a little bit anxious. Head:  No asymmetry or swelling. No signs of head trauma.  Eyes:  Sclera anicteric. Ears:  No hearing deficit  Nose:  No congestion or discharge. Mouth:  Oropharynx moist and clear. Tongue midline. Neck:  No masses, no thyromegaly, no JVD. Lungs:  Clear bilaterally. No cough or labored breathing. Heart: RRR. No MRG. S1, S2 present. Abdomen:  Soft. No masses. No HSM. No hernias. Nontender. Active bowel sounds.   Rectal: Deferred   Musc/Skeltl: No gross joint deformities, erythema or swelling. Extremities:  No CCE.  Neurologic:  Alert. Oriented times 3. No asterixis. No tremor. Moves all 4 limbs, strength not tested. Skin:  Mild, hardly noticeable jaundice. Punctate excoriations on the upper back. Tattoos:   None Nodes:  No cervical adenopathy   Psych:  Pleasant, cooperative, calm.  Intake/Output from previous day: No intake/output data recorded. Intake/Output this shift: No intake/output data recorded.  LAB RESULTS:  Recent Labs  08/07/16 1135  WBC 7.8  HGB 11.8*  HCT 35.6*  PLT 478*   BMET Lab Results  Component Value Date   NA 138 08/07/2016   NA 141 03/23/2016   NA 137 03/21/2016   K 4.0 08/07/2016   K 3.4 (L) 03/23/2016   K 3.5 03/21/2016   CL 104 08/07/2016   CL 112 (H) 03/23/2016   CL 108 03/21/2016   CO2 24 08/07/2016  CO2 21 (L) 03/23/2016   CO2 23 03/21/2016   GLUCOSE 110 (H) 08/07/2016   GLUCOSE 100 (H) 03/23/2016   GLUCOSE 117 (H) 03/21/2016   BUN 5 (L) 08/07/2016   BUN 7 03/23/2016   BUN 10 03/21/2016   CREATININE 0.83 08/07/2016   CREATININE 0.79 03/23/2016   CREATININE 0.80 03/21/2016   CALCIUM 10.1 08/07/2016   CALCIUM 8.5 (L) 03/23/2016   CALCIUM 8.9 03/21/2016   LFT  Recent Labs  08/07/16 1135  PROT 8.1  ALBUMIN 3.9  AST 895*  ALT 1,171*  ALKPHOS 587*  BILITOT 9.1*  BILIDIR 5.5*  IBILI 3.6*   PT/INR Lab Results  Component Value Date   INR 1.03 08/07/2016   Hepatitis Panel No results for input(s): HEPBSAG, HCVAB, HEPAIGM, HEPBIGM in the last 72 hours. C-Diff No components found for: CDIFF Lipase     Component Value Date/Time   LIPASE 18 03/21/2016 0536    Drugs of Abuse  No results found for: LABOPIA, COCAINSCRNUR, LABBENZ, AMPHETMU, THCU, LABBARB   RADIOLOGY STUDIES: No results found.    IMPRESSION:   *  Painless jaundice.  1.3 centimeter, dilated, common bile duct without obvious choledocholithiasis. Small cystic lucency in the region of the GB fossa at/common bile duct. R/o choledocholithiasis. R/o neoplasm, r/o autoimmune liver disease.  Hep A/B/C  *  Status post laparoscopic cholecystectomy 03/22/16. Mild fatty liver, dilated bile duct but no CBD stones on preop ultrasound and MRCP.  *  Menorrhagia. This had  been going on for approximately a month but ceased on 2/12.  Current hemoglobin of 11.8 compares with hemoglobin of 10.9 on October 1, just after her gallbladder surgery.    PLAN:     *  Canceled CT scan and ordered MR/MRCP.   Azucena Freed  08/07/2016, 3:09 PM Pager: [phone]

## 2016-08-07 NOTE — ED Notes (Signed)
Spoke with pt regarding NPO status pt. Aware she is to have nothing by mouth until she gets MRCP complete later tonight

## 2016-08-07 NOTE — H&P (Signed)
History and Physical    Chelsea Cannon [id number] DOB: 01-06-1966 DOA: 08/07/2016   PCP: No PCP Per Patient   Patient coming from:  Home    Chief Complaint: Painless jaundice   HPI: Chelsea Cannon is a 51 y.o. female with medical history significant for remote asthma, prior admission for cholecystectomy from 10/1-10/2 2017,  Followed at [facility] for generalized malaise, weakness, . Labs were drawn on presentation which revealed  Very elevated LFTs . She was transferred to [facility] for further workup. As OP abdominal US was performed which showed a "block in the hepatic duct" . GI evaluation was obtained, and it was felt prudent to admit the patient for further evaluation  And planned MRCP in am. Denies fevers, chills, night sweats, vision changes, or mucositis. Denies any respiratory complaints. Denies any chest pain or palpitations. Denies lower extremity swelling. Denies nausea, heartburn or change in bowel habits. Denies abdominal pain. Appetite is normal. Denies any dysuria. She noted stronger yellow colored urine. Denies abnormal skin rashes, or neuropathy. Denies any bleeding issues such as epistaxis, hematemesis, hematuria or hematochezia. She does have prolonged periods in the setting or perimenopause, along with hot flashes.  Ambulating without difficulty. Denies ETOH, tobacco, recreational drug use .SHe rarely uses NSAIDS  No family history of cancer of the liver. Mother with a history of remote colon cancer.    ED Course:  BP 113/66 (BP Location: Right Arm)   Pulse 88   Temp 97.9 F (36.6 C) (Oral)   Resp 16   Ht 5\' 6"  (1.676 m)   Wt 71.2 kg (157 lb)   LMP 07/17/2016 (Approximate)   SpO2 100%   BMI 25.34 kg/m    As OP, Total bilirubin was 9.8 with direct bilirubin of 6.1. Alkaline phosphatase 549.  AST 930.  Lab unable to report on ALT level. Hepatitis A/B/C serologies negative   Ultrasound was set up and performed today at [facility].  This  showed a 1.3 centimeter CBD, possibly reflecting postcholecystectomy change a 1.8 x 1.5 x 1.1 cm cystic structure adjacent to the CBD in the gallbladder fossa.   LFTs were repeated today after she was sent to the emergency department and total bilirubin is 9.1, direct bilirubin 5.5. alkaline phosphatase 587. AST 895, ALT 1171. Pro time 13.6, INR 1.0.    Review of Systems: As per HPI otherwise 10 point review of systems negative.   Past Medical History:  Diagnosis Date  . Anemia   . Asthma   . Jaundice   . Menorrhagia     Past Surgical History:  Procedure Laterality Date  . CHOLECYSTECTOMY N/A 03/22/2016   Procedure: LAPAROSCOPIC CHOLECYSTECTOMY WITH INTRAOPERATIVE CHOLANGIOGRAM;  Surgeon: Georganna Skeans, MD;  Location: [facility];  Service: General;  Laterality: N/A;    Social History Social History   Social History  . Marital status: Married    Spouse name: N/A  . Number of children: N/A  . Years of education: N/A   Occupational History  . Not on file.   Social History Main Topics  . Smoking status: Never Smoker  . Smokeless tobacco: Never Used  . Alcohol use No  . Drug use: No  . Sexual activity: Not on file   Other Topics Concern  . Not on file   Social History Narrative  . No narrative on file     No Known Allergies  Family History  Problem Relation Age of Onset  . Colon cancer Mother   .  Diabetes Father   . Diabetes Sister       Prior to Admission medications   Medication Sig Start Date End Date Taking? Authorizing Provider  Famotidine (PEPCID PO) Take 2 tablets by mouth daily as needed (heartburn).    Historical Provider, MD  oxyCODONE (OXY IR/ROXICODONE) 5 MG immediate release tablet Take 1 tablet (5 mg total) by mouth every 6 (six) hours as needed for moderate pain or severe pain. Patient not taking: Reported on 08/07/2016 03/24/16   Kelvin Cellar, MD  polyethylene glycol [facility] / GLYCOLAX) packet Take 17 g by mouth daily. Patient not taking:  Reported on 08/07/2016 03/24/16   Kelvin Cellar, MD  ranitidine (ZANTAC) 150 MG tablet Take 150 mg by mouth daily as needed for heartburn.    Historical Provider, MD    Physical Exam:  Vitals:   08/07/16 1114 08/07/16 1119 08/07/16 1354  BP: 136/97  113/66  Pulse: 96  88  Resp: 18  16  Temp: 97.9 F (36.6 C)    TempSrc: Oral    SpO2: 98%  100%  Weight:  71.2 kg (157 lb)   Height:  5\' 6"  (1.676 m)    Constitutional: NAD, calm, comfortable  Eyes: PERRL, lids  Normal. Sclerae icteric  ENMT: Mucous membranes are moist, without exudate or lesions  Neck: normal, supple, no masses, no thyromegaly Respiratory: clear to auscultation bilaterally, no wheezing, no crackles. Normal respiratory effort  Cardiovascular: Regular rate and rhythm, no murmurs, rubs or gallops. No extremity edema. 2+ pedal pulses. No carotid bruits.  Abdomen: Soft, non tender, No hepatosplenomegaly. Bowel sounds positive.  Musculoskeletal: no clubbing / cyanosis. Moves all extremities Skin: very  jaundiced, No lesions.  Neurologic: Sensation intact  Strength equal in all extremities Psychiatric:   Alert and oriented x 3. Normal mood.     Labs on Admission: I have personally reviewed following labs and imaging studies  CBC:  Recent Labs Lab 08/07/16 1135  WBC 7.8  NEUTROABS 4.8  HGB 11.8*  HCT 35.6*  MCV 82.4  PLT 478*    Basic Metabolic Panel:  Recent Labs Lab 08/07/16 1135  NA 138  K 4.0  CL 104  CO2 24  GLUCOSE 110*  BUN 5*  CREATININE 0.83  CALCIUM 10.1    GFR: Estimated Creatinine Clearance: 81.1 mL/min (by C-G formula based on SCr of 0.83 mg/dL).  Liver Function Tests:  Recent Labs Lab 08/07/16 1135  AST 895*  ALT 1,171*  ALKPHOS 587*  BILITOT 9.1*  PROT 8.1  ALBUMIN 3.9   No results for input(s): LIPASE, AMYLASE in the last 168 hours. No results for input(s): AMMONIA in the last 168 hours.  Coagulation Profile:  Recent Labs Lab 08/07/16 1135  INR 1.03     Cardiac Enzymes: No results for input(s): CKTOTAL, CKMB, CKMBINDEX, TROPONINI in the last 168 hours.  BNP (last 3 results) No results for input(s): PROBNP in the last 8760 hours.  HbA1C: No results for input(s): HGBA1C in the last 72 hours.  CBG: No results for input(s): GLUCAP in the last 168 hours.  Lipid Profile: No results for input(s): CHOL, HDL, LDLCALC, TRIG, CHOLHDL, LDLDIRECT in the last 72 hours.  Thyroid Function Tests: No results for input(s): TSH, T4TOTAL, FREET4, T3FREE, THYROIDAB in the last 72 hours.  Anemia Panel: No results for input(s): VITAMINB12, FOLATE, FERRITIN, TIBC, IRON, RETICCTPCT in the last 72 hours.  Urine analysis: No results found for: COLORURINE, APPEARANCEUR, [place], [place], [place], [place], [place], [place], [place], [place], NITRITE, LEUKOCYTESUR  Sepsis Labs: @LABRCNTIP (procalcitonin:4,lacticidven:4) )No results found for this or any previous visit (from the past 240 hour(s)).   Radiological Exams on Admission: No results found.  EKG: Independently reviewed.  Assessment/Plan Active Problems:   Common bile duct dilatation   Elevated LFTs   Jaundice   Fatty liver disease, nonalcoholic   Asthma   Anemia   Menorrhagia     Painless jaundice with very Abnormal LFTs, likely due to obstruction.  Patient is s/p cholecystectomy in 03/23/16 OP  Abdominal ulltrasound shows 1.3 cm dilated CBD without obvious choledocolithiasis, small cystic lucency in the region of the GB fossa at the CBD No history of alcohol. Antismooth AB IGG and ANA are pending  Acute hepatitis panel G.I. consultation is pending.  Admit to medsurg inpatient IVF NPO after midnight  CBC, PT, IN, CMET in am  Plan for MR/MRCP in am by GI    History of Asthma, remote, no recent events No intervention indicated at this time. Continue O2 prn   Menorrhagia, last menstrual period 1 month ago, but has not ceased till 2/12 . MCV normal.  Hb 11.8, unchanged  from recent Hb as OP . No indication for transfusion of blood at this time  Patient to follow with Gyn   DVT prophylaxis: SCD's  Code Status:   Full      Family Communication:  Discussed with patient Disposition Plan: Expect patient to be discharged to home after condition improves Consults called:   GI  Admission status: [place], PA-C [facility]   08/07/2016, 4:08 PM

## 2016-08-07 NOTE — ED Provider Notes (Signed)
[facility] DEPT Provider Note   CSN: [id number] Arrival date & time: 08/07/16  1109     History   Chief Complaint No chief complaint on file.   HPI   Blood pressure 136/97, pulse 96, temperature 97.9 F (36.6 C), temperature source Oral, resp. rate 18, height 5\' 6"  (1.676 m), weight 71.2 kg, last menstrual period 07/17/2016, SpO2 98 %.  Chelsea Cannon is a 51 y.o. female sent from primary care for biliary blockage. She initially started seeing her primary care for jaundice and now resolved right upper quadrant pain early February. Ultrasound today showed blockage. Patient denies any active pain, fever, chills, nausea vomiting. She denies any alcohol use.  Past Medical History:  Diagnosis Date  . Asthma     Patient Active Problem List   Diagnosis Date Noted  . Cholecystitis   . Intractable vomiting with nausea   . Cholelithiasis 03/21/2016  . Cholelithiasis without cholecystitis 03/20/2016  . Acute epigastric pain 03/20/2016  . Common bile duct dilatation 03/20/2016  . Fatty liver disease, nonalcoholic 0000000  . Leukocytosis 03/20/2016    Past Surgical History:  Procedure Laterality Date  . CHOLECYSTECTOMY N/A 03/22/2016   Procedure: LAPAROSCOPIC CHOLECYSTECTOMY WITH INTRAOPERATIVE CHOLANGIOGRAM;  Surgeon: Georganna Skeans, MD;  Location: [facility];  Service: General;  Laterality: N/A;    OB History    No data available       Home Medications    Prior to Admission medications   Medication Sig Start Date End Date Taking? Authorizing Provider  Famotidine (PEPCID PO) Take 2 tablets by mouth daily as needed (heartburn).    Historical Provider, MD  oxyCODONE (OXY IR/ROXICODONE) 5 MG immediate release tablet Take 1 tablet (5 mg total) by mouth every 6 (six) hours as needed for moderate pain or severe pain. 03/24/16   Kelvin Cellar, MD  polyethylene glycol (MIRALAX / GLYCOLAX) packet Take 17 g by mouth daily. 03/24/16   Kelvin Cellar, MD  ranitidine (ZANTAC)  150 MG tablet Take 150 mg by mouth daily as needed for heartburn.    Historical Provider, MD    Family History History reviewed. No pertinent family history.  Social History Social History  Substance Use Topics  . Smoking status: Never Smoker  . Smokeless tobacco: Never Used  . Alcohol use No     Allergies   Patient has no known allergies.   Review of Systems Review of Systems  10 systems reviewed and found to be negative, except as noted in the HPI.   Physical Exam Updated Vital Signs BP 136/97 (BP Location: Right Arm)   Pulse 96   Temp 97.9 F (36.6 C) (Oral)   Resp 18   Ht 5\' 6"  (1.676 m)   Wt 71.2 kg   LMP 07/17/2016 (Approximate)   SpO2 98%   BMI 25.34 kg/m   Physical Exam  Constitutional: She is oriented to person, place, and time. She appears well-developed and well-nourished. No distress.  HENT:  Head: Normocephalic and atraumatic.  Mouth/Throat: Oropharynx is clear and moist.  Eyes: Conjunctivae and EOM are normal. Pupils are equal, round, and reactive to light. Scleral icterus is present.  Significant jaundice  Neck: Normal range of motion.  Cardiovascular: Normal rate, regular rhythm and intact distal pulses.   Pulmonary/Chest: Effort normal and breath sounds normal.  Abdominal: Soft. She exhibits no distension and no mass. There is no tenderness. There is no rebound and no guarding. No hernia.  Musculoskeletal: Normal range of motion.  Neurological: She is alert and  oriented to person, place, and time.  Skin: She is not diaphoretic.  Psychiatric: She has a normal mood and affect.  Nursing note and vitals reviewed.    ED Treatments / Results  Labs (all labs ordered are listed, but only abnormal results are displayed) Labs Reviewed  CBC WITH DIFFERENTIAL/PLATELET  BASIC METABOLIC PANEL  HEPATIC FUNCTION PANEL  PROTIME-INR  HEPATITIS PANEL, ACUTE    EKG  EKG Interpretation None       Radiology No results  found.  Procedures Procedures (including critical care time)  Medications Ordered in ED Medications - No data to display   Initial Impression / Assessment and Plan / ED Course  I have reviewed the triage vital signs and the nursing notes.  Pertinent labs & imaging results that were available during my care of the patient were reviewed by me and considered in my medical decision making (see chart for details).     Vitals:   08/07/16 1114 08/07/16 1119 08/07/16 1354  BP: 136/97  113/66  Pulse: 96  88  Resp: 18  16  Temp: 97.9 F (36.6 C)    TempSrc: Oral    SpO2: 98%  100%  Weight:  71.2 kg   Height:  5\' 6"  (1.676 m)     Medications - No data to display  Chelsea Cannon is 51 y.o. female presenting with Jaundice, resolved abdominal pain onset 2 weeks ago. Was sent by primary care for abnormal liver ultrasound. Unfortunately we do not have a copy of the ultrasound of the read. Left ear is here are elevated AST 895, ALT 171 and T bili is 9.1.  Fax of liver ultrasound shows: Patient is status post cholecystectomy, ultrasound report shows dilation of the common bile duct (1.3cm) likely reflecting a postcholecystectomy change, no evidence of choledocholithiasis or pancreatic head mass. They do see mild fatty liver. There is a small cystic structure adjacent to the common bile duct and the gallbladder fossa measuring 1.8 x 1.5 x 1.1.  Discuss with gastroenterology APP [place] who would prefer an MRCP, she's changed those orders. She will need admission to trend LFTs, their service will consult.  Unassigned admission to [facility], d/w APP [facility]    Final Clinical Impressions(s) / ED Diagnoses   Final diagnoses:  Transaminitis  Hyperbilirubinemia    New Prescriptions New Prescriptions   No medications on file     Monico Blitz, PA-C 08/07/16 [place], MD 08/08/16 [id number]

## 2016-08-08 ENCOUNTER — Encounter (HOSPITAL_COMMUNITY): Payer: Self-pay | Admitting: *Deleted

## 2016-08-08 ENCOUNTER — Inpatient Hospital Stay (HOSPITAL_COMMUNITY): Payer: Medicaid Other | Admitting: Anesthesiology

## 2016-08-08 ENCOUNTER — Encounter (HOSPITAL_COMMUNITY)
Admission: EM | Disposition: A | Discharge status: Home / Self Care | Payer: Self-pay | Source: Home / Self Care | Attending: Internal Medicine

## 2016-08-08 ENCOUNTER — Inpatient Hospital Stay (HOSPITAL_COMMUNITY): Payer: Medicaid Other

## 2016-08-08 DIAGNOSIS — D649 Anemia, unspecified: Secondary | ICD-10-CM

## 2016-08-08 DIAGNOSIS — K8050 Calculus of bile duct without cholangitis or cholecystitis without obstruction: Secondary | ICD-10-CM

## 2016-08-08 DIAGNOSIS — J45909 Unspecified asthma, uncomplicated: Secondary | ICD-10-CM

## 2016-08-08 DIAGNOSIS — R17 Unspecified jaundice: Secondary | ICD-10-CM

## 2016-08-08 DIAGNOSIS — K760 Fatty (change of) liver, not elsewhere classified: Secondary | ICD-10-CM

## 2016-08-08 HISTORY — PX: ERCP: SHX5425

## 2016-08-08 LAB — COMPREHENSIVE METABOLIC PANEL
ALBUMIN: 3.3 g/dL — AB (ref 3.5–5.0)
ALT: 1043 U/L — ABNORMAL HIGH (ref 14–54)
ANION GAP: 10 (ref 5–15)
AST: 867 U/L — ABNORMAL HIGH (ref 15–41)
Alkaline Phosphatase: 504 U/L — ABNORMAL HIGH (ref 38–126)
BUN: 10 mg/dL (ref 6–20)
CHLORIDE: 108 mmol/L (ref 101–111)
CO2: 20 mmol/L — AB (ref 22–32)
Calcium: 9.3 mg/dL (ref 8.9–10.3)
Creatinine, Ser: 0.76 mg/dL (ref 0.44–1.00)
GFR calc non Af Amer: >60 mL/min (ref >60)
GLUCOSE: 96 mg/dL (ref 65–99)
Potassium: 3.7 mmol/L (ref 3.5–5.1)
SODIUM: 138 mmol/L (ref 135–145)
Total Bilirubin: 8.7 mg/dL — ABNORMAL HIGH (ref 0.3–1.2)
Total Protein: 6.8 g/dL (ref 6.5–8.1)

## 2016-08-08 LAB — HEPATITIS PANEL, ACUTE
HCV Ab: 0.2 s/co ratio (ref 0.0–0.9)
Hep A IgM: NEGATIVE
Hep B C IgM: NEGATIVE
Hepatitis B Surface Ag: NEGATIVE

## 2016-08-08 LAB — HAPTOGLOBIN: Haptoglobin: 176 mg/dL (ref 34–200)

## 2016-08-08 LAB — CBC
HEMATOCRIT: 32 % — AB (ref 36.0–46.0)
HEMOGLOBIN: 10.4 g/dL — AB (ref 12.0–15.0)
MCH: 26.6 pg (ref 26.0–34.0)
MCHC: 32.5 g/dL (ref 30.0–36.0)
MCV: 81.8 fL (ref 78.0–100.0)
Platelets: 408 10*3/uL — ABNORMAL HIGH (ref 150–400)
RBC: 3.91 MIL/uL (ref 3.87–5.11)
RDW: 15.3 % (ref 11.5–15.5)
WBC: 7.8 10*3/uL (ref 4.0–10.5)

## 2016-08-08 LAB — ANTINUCLEAR ANTIBODIES, IFA: ANA Ab, IFA: NEGATIVE

## 2016-08-08 LAB — PROTIME-INR
INR: 1.13
PROTHROMBIN TIME: 14.5 s (ref 11.4–15.2)

## 2016-08-08 LAB — HEMOGLOBIN A1C
HEMOGLOBIN A1C: 5.4 % (ref 4.8–5.6)
MEAN PLASMA GLUCOSE: 108 mg/dL

## 2016-08-08 LAB — ANTI-SMOOTH MUSCLE ANTIBODY, IGG: F-ACTIN AB IGG: 11 U (ref 0–19)

## 2016-08-08 SURGERY — ERCP, WITH INTERVENTION IF INDICATED
Anesthesia: General

## 2016-08-08 MED ORDER — SODIUM CHLORIDE 0.9 % IV SOLN
3.0000 g | INTRAVENOUS | Status: AC
  Administered 2016-08-08: 3 g via INTRAVENOUS
  Filled 2016-08-08 (×2): qty 3

## 2016-08-08 MED ORDER — PHENYLEPHRINE HCL 10 MG/ML IJ SOLN
INTRAMUSCULAR | Status: DC | PRN
  Administered 2016-08-08: 120 ug via INTRAVENOUS

## 2016-08-08 MED ORDER — MIDAZOLAM HCL 5 MG/5ML IJ SOLN
INTRAMUSCULAR | Status: DC | PRN
  Administered 2016-08-08: 2 mg via INTRAVENOUS

## 2016-08-08 MED ORDER — INDOMETHACIN 50 MG RE SUPP
100.0000 mg | Freq: Once | RECTAL | Status: AC
  Administered 2016-08-08: 100 mg via RECTAL

## 2016-08-08 MED ORDER — IOPAMIDOL (ISOVUE-300) INJECTION 61%
INTRAVENOUS | Status: AC
  Filled 2016-08-08: qty 50

## 2016-08-08 MED ORDER — SUGAMMADEX SODIUM 200 MG/2ML IV SOLN
INTRAVENOUS | Status: DC | PRN
  Administered 2016-08-08: 200 mg via INTRAVENOUS

## 2016-08-08 MED ORDER — PHENYLEPHRINE HCL 10 MG/ML IJ SOLN
INTRAVENOUS | Status: DC | PRN
  Administered 2016-08-08: 25 ug/min via INTRAVENOUS

## 2016-08-08 MED ORDER — GLUCAGON HCL RDNA (DIAGNOSTIC) 1 MG IJ SOLR
INTRAMUSCULAR | Status: AC
  Filled 2016-08-08: qty 1

## 2016-08-08 MED ORDER — SODIUM CHLORIDE 0.9 % IV SOLN
INTRAVENOUS | Status: DC | PRN
  Administered 2016-08-08: 50 mL

## 2016-08-08 MED ORDER — LACTATED RINGERS IV SOLN
INTRAVENOUS | Status: DC | PRN
  Administered 2016-08-08 (×2): via INTRAVENOUS

## 2016-08-08 MED ORDER — INDOMETHACIN 50 MG RE SUPP
RECTAL | Status: AC
  Filled 2016-08-08: qty 2

## 2016-08-08 MED ORDER — PROPOFOL 10 MG/ML IV BOLUS
INTRAVENOUS | Status: DC | PRN
  Administered 2016-08-08: 150 mg via INTRAVENOUS

## 2016-08-08 MED ORDER — LIDOCAINE HCL (CARDIAC) 20 MG/ML IV SOLN
INTRAVENOUS | Status: DC | PRN
  Administered 2016-08-08: 80 mg via INTRAVENOUS

## 2016-08-08 MED ORDER — FENTANYL CITRATE (PF) 100 MCG/2ML IJ SOLN
INTRAMUSCULAR | Status: DC | PRN
  Administered 2016-08-08: 50 ug via INTRAVENOUS
  Administered 2016-08-08: 100 ug via INTRAVENOUS

## 2016-08-08 MED ORDER — ROCURONIUM BROMIDE 100 MG/10ML IV SOLN
INTRAVENOUS | Status: DC | PRN
  Administered 2016-08-08: 10 mg via INTRAVENOUS
  Administered 2016-08-08: 50 mg via INTRAVENOUS

## 2016-08-08 MED ORDER — INDOMETHACIN 50 MG RE SUPP
RECTAL | Status: DC | PRN
  Administered 2016-08-08: 100 mg via RECTAL

## 2016-08-08 MED ORDER — GLUCAGON HCL RDNA (DIAGNOSTIC) 1 MG IJ SOLR
INTRAMUSCULAR | Status: DC | PRN
  Administered 2016-08-08: .5 mg via INTRAVENOUS

## 2016-08-08 MED ORDER — ONDANSETRON HCL 4 MG/2ML IJ SOLN
INTRAMUSCULAR | Status: DC | PRN
  Administered 2016-08-08: 4 mg via INTRAVENOUS

## 2016-08-08 NOTE — Progress Notes (Signed)
[facility]                                                                              Patient Demographics  Chelsea Cannon, is a 51 y.o. female, DOB - 11-30-1965, [id number]  Admit date - 08/07/2016   Admitting Physician Elwin Mocha, MD  Outpatient Primary MD for the patient is Lindie Spruce, MD  Outpatient specialists:   LOS - 1  days    No chief complaint on file.      Brief summary   Patient is a 51 year old female with asthma, cholecystectomy was seen at [facility] for generalized malaise, weakness. Labs drawn showed significant transaminitis and was transferred to [facility] for further workup. Outpatient abdominal ultrasound showed blocked hepatic duct. GI was consulted.   Assessment & Plan   Painless jaundice with very Abnormal LFTs, likely due to obstruction.  -  Patient is s/p cholecystectomy in 03/2016  - Outpatient abdominal ultrasound showed 1.3 cm dilated CBD without obvious choledocolithiasis, small cystic lucency in the region of the GB fossa at the CBD. No history of alcohol. - Antismooth AB pending, ANA negative,  Acute hepatitis panel negative - GI was consulted, patient underwent MRCP which showed 2 CBD stones.  - ERCP planned today     History of Asthma, remote - currently stable, no wheezing  Continue O2 prn   history of Menorrhagia, last menstrual period 1 month ago, but has not ceased till 2/12  -  MCV normal. Hb 11.8,  at baseline  - Outpatient follow-up with OB/GYN   Code Status: full code  DVT Prophylaxis:   SCD's Family Communication: Discussed in detail with the patient, all imaging results, lab results explained to the patient   Disposition Plan: When cleared by GI  Time Spent in minutes 25 minutes  Procedures:   MRCP ERCP planned today  Consultants:    Gastroenterology  Antimicrobials:    Unasyn   Medications  Scheduled Meds: . [MAR Hold] ampicillin-sulbactam (UNASYN) IVPB 3 g  3 g  Intravenous To Endo  . [MAR Hold] famotidine  20 mg Oral Daily  . indomethacin  100 mg Rectal Once   Continuous Infusions: . sodium chloride 100 mL/hr at 08/08/16 0005   PRN Meds:.[MAR Hold] acetaminophen **OR** [MAR Hold] acetaminophen, [MAR Hold] albuterol, [MAR Hold] bisacodyl, [MAR Hold] HYDROcodone-acetaminophen, [MAR Hold] magnesium citrate, [MAR Hold] ondansetron **OR** [MAR Hold] ondansetron (ZOFRAN) IV, [MAR Hold] senna-docusate, [MAR Hold] traZODone   Antibiotics   Anti-infectives    Start     Dose/Rate Route Frequency Ordered Stop   08/08/16 1015  [MAR Hold]  Ampicillin-Sulbactam (UNASYN) 3 g in sodium chloride 0.9 % 100 mL IVPB     (MAR Hold since 08/08/16 1159)   3 g 200 mL/hr over 30 Minutes Intravenous To Endoscopy 08/08/16 [id number] 08/09/16 1015        Subjective:   Chelsea Cannon was seen and examined today.  And abdominal pain except for scleral icterus and jaundice no itching.  Patient denies dizziness, chest pain, shortness of breath, abdominal pain, N/V/D/C, new weakness, numbess, tingling. No acute events overnight.    Objective:  Vitals:   08/07/16 2051 08/08/16 0214 08/08/16 0450 08/08/16 1200  BP: 114/67  103/60 (!) 146/87  Pulse: 77  68 92  Resp: 18  18 20   Temp: 99.1 F (37.3 C)  97.9 F (36.6 C) 98.2 F (36.8 C)  TempSrc: Oral  Oral Oral  SpO2: 97%  100% 99%  Weight:  71.7 kg (158 lb 1.1 oz)    Height:        Intake/Output Summary (Last 24 hours) at 08/08/16 1223 Last data filed at 08/08/16 1145  Gross per 24 hour  Intake           741.67 ml  Output             1300 ml  Net          -558.33 ml     Wt Readings from Last 3 Encounters:  08/08/16 71.7 kg (158 lb 1.1 oz)  03/20/16 70.8 kg (156 lb 1.4 oz)  02/12/13 63.5 kg (140 lb)     Exam  General: Alert and oriented x 3, NAD, Jaundice  HEENT:  PERRLA, EOMI, icteric Sclera, mucous membranes moist.   Neck: Supple, no JVD, no masses  Cardiovascular: S1 S2 auscultated, no rubs,  murmurs or gallops. Regular rate and rhythm.  Respiratory: Clear to auscultation bilaterally, no wheezing, rales or rhonchi  Gastrointestinal: Soft, nontender, nondistended, + bowel sounds  Ext: no cyanosis clubbing or edema  Neuro: AAOx3, Cr N's II- XII. Strength 5/5 upper and lower extremities bilaterally  Skin: No rashes  Psych: Normal affect and demeanor, alert and oriented x3    Data Reviewed:  I have personally reviewed following labs and imaging studies  Micro Results No results found for this or any previous visit (from the past 240 hour(s)).  Radiology Reports Mr 3d Recon At Scanner  Result Date: 08/08/2016 CLINICAL DATA:  Painless jaundice. Status post laparoscopic cholecystectomy 03/22/2016. EXAM: MRI ABDOMEN WITHOUT AND WITH CONTRAST (INCLUDING MRCP) TECHNIQUE: Multiplanar multisequence MR imaging of the abdomen was performed both before and after the administration of intravenous contrast. Heavily T2-weighted images of the biliary and pancreatic ducts were obtained, and three-dimensional MRCP images were rendered by post processing. CONTRAST:  50mL MULTIHANCE GADOBENATE DIMEGLUMINE 529 MG/ML IV SOLN COMPARISON:  03/20/2016 FINDINGS: Lower chest: Normal heart size without pericardial or pleural effusion. Hepatobiliary: 2 right hepatic lobe hemangiomas are again identified. Status post cholecystectomy. Moderate-to-marked intrahepatic biliary duct dilatation. A probable stone in the central aspect of the right hepatic duct. Example 6 mm on image 34/series 5 and image 25/series 3. The common duct is moderately dilated, including at 16 mm on image 17/ series 6. Distal common duct stones measure 6 and 9 mm respectively on series 6. Pancreas:  Normal, without mass or ductal dilatation. Spleen:  Normal in size, without focal abnormality. Adrenals/Urinary Tract: Normal adrenal glands. Normal left kidney. Subcentimeter interpolar right renal cysts. Stomach/Bowel: Normal stomach and  abdominal bowel loops. Vascular/Lymphatic: Normal caliber of the aorta and branch vessels. No retroperitoneal or retrocrural adenopathy. Other:  No ascites. Musculoskeletal: No acute osseous abnormality. IMPRESSION: 1. Two distal common duct stones with resultant biliary duct dilatation. 2. Probable central right common hepatic duct stone. This was not described in the preliminary report. 3. Right hepatic lobe hemangiomas. These results will be called to the ordering clinician or representative by the Radiologist Assistant, and communication documented in the PACS or zVision Dashboard. Electronically Signed   By: Abigail Miyamoto M.D.   On: 08/08/2016 07:14   Mr  Abdomen Mrcp W Wo Contast  Result Date: 08/08/2016 CLINICAL DATA:  Painless jaundice. Status post laparoscopic cholecystectomy 03/22/2016. EXAM: MRI ABDOMEN WITHOUT AND WITH CONTRAST (INCLUDING MRCP) TECHNIQUE: Multiplanar multisequence MR imaging of the abdomen was performed both before and after the administration of intravenous contrast. Heavily T2-weighted images of the biliary and pancreatic ducts were obtained, and three-dimensional MRCP images were rendered by post processing. CONTRAST:  7mL MULTIHANCE GADOBENATE DIMEGLUMINE 529 MG/ML IV SOLN COMPARISON:  03/20/2016 FINDINGS: Lower chest: Normal heart size without pericardial or pleural effusion. Hepatobiliary: 2 right hepatic lobe hemangiomas are again identified. Status post cholecystectomy. Moderate-to-marked intrahepatic biliary duct dilatation. A probable stone in the central aspect of the right hepatic duct. Example 6 mm on image 34/series 5 and image 25/series 3. The common duct is moderately dilated, including at 16 mm on image 17/ series 6. Distal common duct stones measure 6 and 9 mm respectively on series 6. Pancreas:  Normal, without mass or ductal dilatation. Spleen:  Normal in size, without focal abnormality. Adrenals/Urinary Tract: Normal adrenal glands. Normal left kidney.  Subcentimeter interpolar right renal cysts. Stomach/Bowel: Normal stomach and abdominal bowel loops. Vascular/Lymphatic: Normal caliber of the aorta and branch vessels. No retroperitoneal or retrocrural adenopathy. Other:  No ascites. Musculoskeletal: No acute osseous abnormality. IMPRESSION: 1. Two distal common duct stones with resultant biliary duct dilatation. 2. Probable central right common hepatic duct stone. This was not described in the preliminary report. 3. Right hepatic lobe hemangiomas. These results will be called to the ordering clinician or representative by the Radiologist Assistant, and communication documented in the PACS or zVision Dashboard. Electronically Signed   By: Abigail Miyamoto M.D.   On: 08/08/2016 07:14    Lab Data:  CBC:  Recent Labs Lab 08/07/16 1135 08/08/16 0622  WBC 7.8 7.8  NEUTROABS 4.8  --   HGB 11.8* 10.4*  HCT 35.6* 32.0*  MCV 82.4 81.8  PLT 478* [id number]*   Basic Metabolic Panel:  Recent Labs Lab 08/07/16 1135 08/08/16 0622  NA 138 138  K 4.0 3.7  CL 104 108  CO2 24 20*  GLUCOSE 110* 96  BUN 5* 10  CREATININE 0.83 0.76  CALCIUM 10.1 9.3   GFR: Estimated Creatinine Clearance: 84.4 mL/min (by C-G formula based on SCr of 0.76 mg/dL). Liver Function Tests:  Recent Labs Lab 08/07/16 1135 08/08/16 0622  AST 895* 867*  ALT 1,171* 1,043*  ALKPHOS 587* 504*  BILITOT 9.1* 8.7*  PROT 8.1 6.8  ALBUMIN 3.9 3.3*   No results for input(s): LIPASE, AMYLASE in the last 168 hours. No results for input(s): AMMONIA in the last 168 hours. Coagulation Profile:  Recent Labs Lab 08/07/16 1135 08/08/16 0622  INR 1.03 1.13   Cardiac Enzymes: No results for input(s): CKTOTAL, CKMB, CKMBINDEX, TROPONINI in the last 168 hours. BNP (last 3 results) No results for input(s): PROBNP in the last 8760 hours. HbA1C:  Recent Labs  08/07/16 1629  HGBA1C 5.4   CBG: No results for input(s): GLUCAP in the last 168 hours. Lipid Profile: No results for  input(s): CHOL, HDL, LDLCALC, TRIG, CHOLHDL, LDLDIRECT in the last 72 hours. Thyroid Function Tests: No results for input(s): TSH, T4TOTAL, FREET4, T3FREE, THYROIDAB in the last 72 hours. Anemia Panel: No results for input(s): VITAMINB12, FOLATE, FERRITIN, TIBC, IRON, RETICCTPCT in the last 72 hours. Urine analysis: No results found for: COLORURINE, APPEARANCEUR, LABSPEC, PHURINE, GLUCOSEU, HGBUR, BILIRUBINUR, KETONESUR, PROTEINUR, UROBILINOGEN, NITRITE, Corliss Skains M.D. [facility] 08/08/2016, 12:23 PM  Pager:  [phone] Between 7am to 7pm - call Pager - [phone]  After 7pm go to [email or web address] - password [email or web address]  Call night coverage person covering after 7pm

## 2016-08-08 NOTE — Transfer of Care (Signed)
Immediate Anesthesia Transfer of Care Note  Patient: Chelsea Cannon  Procedure(s) Performed: Procedure(s): ENDOSCOPIC RETROGRADE CHOLANGIOPANCREATOGRAPHY (ERCP) (N/A)  Patient Location: Endoscopy Unit  Anesthesia Type:General  Level of Consciousness: sedated  Airway & Oxygen Therapy: Patient Spontanous Breathing and Patient connected to nasal cannula oxygen  Post-op Assessment: Report given to RN, Post -op Vital signs reviewed and stable and Patient moving all extremities X 4  Post vital signs: Reviewed and stable  Last Vitals:  Vitals:   08/08/16 1200 08/08/16 1438  BP: (!) 146/87 132/71  Pulse: 92 89  Resp: 20 (!) 22  Temp: 36.8 C     Last Pain:  Vitals:   08/08/16 1200  TempSrc: Oral         Complications: No apparent anesthesia complications

## 2016-08-08 NOTE — Progress Notes (Signed)
Patient ID: Chelsea Cannon, female   DOB: 1965/07/28, 51 y.o.   MRN: [id number]    Progress Note   Subjective  Feels Ok, denies nausea,vomiting or pain, t max 99.1  MRCP findings reviewed with pt   Objective   Vital signs in last 24 hours: Temp:  [97.9 F (36.6 C)-99.1 F (37.3 C)] 97.9 F (36.6 C) (02/16 0450) Pulse Rate:  [68-96] 68 (02/16 0450) Resp:  [16-18] 18 (02/16 0450) BP: (103-149)/(60-97) 103/60 (02/16 0450) SpO2:  [97 %-100 %] 100 % (02/16 0450) Weight:  [157 lb (71.2 kg)-158 lb 1.1 oz (71.7 kg)] 158 lb 1.1 oz (71.7 kg) (02/16 0214) Last BM Date: 08/06/16 General:   Jaundiced  white female in NAD,pleasant Heart:  Regular rate and rhythm; no murmurs Lungs: Respirations even and unlabored, lungs CTA bilaterally Abdomen:  Soft, nontender and nondistended. Normal bowel sounds. Extremities:  Without edema, jaundiced. Neurologic:  Alert and oriented,  grossly normal neurologically. Psych:  Cooperative. Normal mood and affect.  Intake/Output from previous day: 02/15 0701 - 02/16 0700 In: 741.7 [I.V.:741.7] Out: 550 [Urine:550] Intake/Output this shift: Total I/O In: 0  Out: 450 [Urine:450]  Lab Results:  Recent Labs  08/07/16 1135 08/08/16 0622  WBC 7.8 7.8  HGB 11.8* 10.4*  HCT 35.6* 32.0*  PLT 478* 408*   BMET  Recent Labs  08/07/16 1135 08/08/16 0622  NA 138 138  K 4.0 3.7  CL 104 108  CO2 24 20*  GLUCOSE 110* 96  BUN 5* 10  CREATININE 0.83 0.76  CALCIUM 10.1 9.3   LFT  Recent Labs  08/07/16 1135 08/07/16 1629 08/08/16 0622  PROT 8.1  --  6.8  ALBUMIN 3.9  --  3.3*  AST 895*  --  867*  ALT 1,171*  --  1,043*  ALKPHOS 587*  --  504*  BILITOT 9.1*  --  8.7*  BILIDIR 5.5* 5.4*  --   IBILI 3.6*  --   --    PT/INR  Recent Labs  08/07/16 1135 08/08/16 0622  LABPROT 13.6 14.5  INR 1.03 1.13    Studies/Results: Mr 3d Recon At Scanner  Result Date: 08/08/2016 CLINICAL DATA:  Painless jaundice. Status post laparoscopic  cholecystectomy 03/22/2016. EXAM: MRI ABDOMEN WITHOUT AND WITH CONTRAST (INCLUDING MRCP) TECHNIQUE: Multiplanar multisequence MR imaging of the abdomen was performed both before and after the administration of intravenous contrast. Heavily T2-weighted images of the biliary and pancreatic ducts were obtained, and three-dimensional MRCP images were rendered by post processing. CONTRAST:  61mL MULTIHANCE GADOBENATE DIMEGLUMINE 529 MG/ML IV SOLN COMPARISON:  03/20/2016 FINDINGS: Lower chest: Normal heart size without pericardial or pleural effusion. Hepatobiliary: 2 right hepatic lobe hemangiomas are again identified. Status post cholecystectomy. Moderate-to-marked intrahepatic biliary duct dilatation. A probable stone in the central aspect of the right hepatic duct. Example 6 mm on image 34/series 5 and image 25/series 3. The common duct is moderately dilated, including at 16 mm on image 17/ series 6. Distal common duct stones measure 6 and 9 mm respectively on series 6. Pancreas:  Normal, without mass or ductal dilatation. Spleen:  Normal in size, without focal abnormality. Adrenals/Urinary Tract: Normal adrenal glands. Normal left kidney. Subcentimeter interpolar right renal cysts. Stomach/Bowel: Normal stomach and abdominal bowel loops. Vascular/Lymphatic: Normal caliber of the aorta and branch vessels. No retroperitoneal or retrocrural adenopathy. Other:  No ascites. Musculoskeletal: No acute osseous abnormality. IMPRESSION: 1. Two distal common duct stones with resultant biliary duct dilatation. 2. Probable central right common hepatic duct  stone. This was not described in the preliminary report. 3. Right hepatic lobe hemangiomas. These results will be called to the ordering clinician or representative by the Radiologist Assistant, and communication documented in the PACS or zVision Dashboard. Electronically Signed   By: Abigail Miyamoto M.D.   On: 08/08/2016 07:14   Mr Abdomen Mrcp Moise Boring Contast  Result Date:  08/08/2016 CLINICAL DATA:  Painless jaundice. Status post laparoscopic cholecystectomy 03/22/2016. EXAM: MRI ABDOMEN WITHOUT AND WITH CONTRAST (INCLUDING MRCP) TECHNIQUE: Multiplanar multisequence MR imaging of the abdomen was performed both before and after the administration of intravenous contrast. Heavily T2-weighted images of the biliary and pancreatic ducts were obtained, and three-dimensional MRCP images were rendered by post processing. CONTRAST:  75mL MULTIHANCE GADOBENATE DIMEGLUMINE 529 MG/ML IV SOLN COMPARISON:  03/20/2016 FINDINGS: Lower chest: Normal heart size without pericardial or pleural effusion. Hepatobiliary: 2 right hepatic lobe hemangiomas are again identified. Status post cholecystectomy. Moderate-to-marked intrahepatic biliary duct dilatation. A probable stone in the central aspect of the right hepatic duct. Example 6 mm on image 34/series 5 and image 25/series 3. The common duct is moderately dilated, including at 16 mm on image 17/ series 6. Distal common duct stones measure 6 and 9 mm respectively on series 6. Pancreas:  Normal, without mass or ductal dilatation. Spleen:  Normal in size, without focal abnormality. Adrenals/Urinary Tract: Normal adrenal glands. Normal left kidney. Subcentimeter interpolar right renal cysts. Stomach/Bowel: Normal stomach and abdominal bowel loops. Vascular/Lymphatic: Normal caliber of the aorta and branch vessels. No retroperitoneal or retrocrural adenopathy. Other:  No ascites. Musculoskeletal: No acute osseous abnormality. IMPRESSION: 1. Two distal common duct stones with resultant biliary duct dilatation. 2. Probable central right common hepatic duct stone. This was not described in the preliminary report. 3. Right hepatic lobe hemangiomas. These results will be called to the ordering clinician or representative by the Radiologist Assistant, and communication documented in the PACS or zVision Dashboard. Electronically Signed   By: Abigail Miyamoto M.D.   On:  08/08/2016 07:14       Assessment / Plan:     #1 51 yo female admitted yesterday with jaundice- pt is s/p lap chole 02/2016 MRCP 02/2016 showed no CBD stones  MRCP last pm reveals 2 distal CBD stones and one possible stone in right common hepatic duct   Plan; Pt is scheduled for ERCP /stone extraction with Dr Loletha Carrow at 1 pm today  Procedure discussed in detail with pt including risks/benefits including 5% risk of pancreatitis. She is agreable to proceed. IV Unasyn ordered pre- procedure Repeat labs in am  Active Problems:   Common bile duct dilatation   Fatty liver disease, nonalcoholic   Elevated LFTs   Jaundice   Asthma   Anemia   Menorrhagia     LOS: 1 day   Amy Esterwood  08/08/2016, 9:54 AM

## 2016-08-08 NOTE — Progress Notes (Signed)
Recived patient from Endo. aler and oriented post ERCP, VSS. Will continue to monitor.

## 2016-08-08 NOTE — Op Note (Signed)
[facility] Patient Name: Chelsea Cannon Procedure Date : 08/08/2016 MRN: [id number] Attending MD: Estill Cotta. Loletha Carrow , MD Date of Birth: 1965/08/28 CSN: [id number] Age: 51 Admit Type: Inpatient Procedure:                ERCP Indications:              For therapy of bile duct stone(s) Providers:                Estill Cotta. Loletha Carrow, MD, Vista Lawman, RN, Cherylynn Ridges, Technician, Neldon Newport CRNA, CRNA Referring MD:              Medicines:                Indomethacin 100 mg PR, General Anesthesia,                            Ciprofloxacin [id number] mg IV Complications:            No immediate complications. Estimated Blood Loss:     Estimated blood loss: none. Procedure:                Pre-Anesthesia Assessment:                           - Prior to the procedure, a History and Physical                            was performed, and patient medications and                            allergies were reviewed. The patient's tolerance of                            previous anesthesia was also reviewed. The risks                            and benefits of the procedure and the sedation                            options and risks were discussed with the patient.                            All questions were answered, and informed consent                            was obtained. Prior Anticoagulants: The patient has                            taken no previous anticoagulant or antiplatelet                            agents. ASA Grade Assessment: II - A patient with  mild systemic disease. After reviewing the risks                            and benefits, the patient was deemed in                            satisfactory condition to undergo the procedure.                           After obtaining informed consent, the scope was                            passed under direct vision. Throughout the                            procedure,  the patient's blood pressure, pulse, and                            oxygen saturations were monitored continuously. The                            [id number]) scope was introduced through                            the mouth, and used to inject contrast into and                            used to inject contrast into the bile duct. The                            ERCP was extremely difficult due to challenging                            cannulation. The patient tolerated the procedure                            well. Scope In: Scope Out: Findings:      A scout film of the abdomen was obtained. Surgical clips, consistent       with a previous cholecystectomy, were seen in the area of the right       upper quadrant of the abdomen. The esophagus was successfully intubated       under direct vision. The scope was advanced to a normal major papilla in       the descending duodenum without detailed examination of the pharynx,       larynx and associated structures, and upper GI tract. The upper GI tract       was grossly normal. 0.035 inch x 260 cm straight Hydra Jagwire was       passed into the ventral pancreatic duct multiple times while attempting       to cannulate the bile duct. The ventral pancreatic duct was then deeply       cannulated with the traction sphincterotome. Contrast was NOT injected       into the PD. One 4 Fr by 3 cm plastic stent with  two external flaps and       a single internal flap was placed into the ventral pancreatic duct.       Clear fluid flowed through the stent. The stent was in good position.       0.035 inch x 260 cm straight Hydra Jagwire was then passed alongside the       PD stent into the biliary tree. The traction (standard) sphincterotome       was passed over the guidewire and the bile duct was then deeply       cannulated. Contrast was injected. The main bile duct was diffusely       dilated. The largest diameter was 13 mm. The upper third of  the main       bile duct contained one stone, which was 12 mm in diameter. A 10 mm       biliary sphincterotomy was made with a traction (standard)       sphincterotome using ERBE electrocautery. There was no       post-sphincterotomy bleeding. The biliary tree was swept with a 15 mm       balloon starting at the bifurcation. Although the balloon occluded the       duct when inflated, it kept hanging up on the stone before passing       around it. This occurred multiple times. One 10 Fr by 7 cm plastic stent       with a single external flap and a single internal flap was placed into       the common bile duct. Bile flowed through the stent. The stent was in       good position, with the proximal end above the stone. Impression:               - The entire main bile duct was dilated.                           - Choledocholithiasis was found. Removal by balloon                            extraction was not accomplished; a stent was                            inserted.                           - One plastic stent was placed into the ventral                            pancreatic duct.                           - A biliary sphincterotomy was performed.                           - The biliary tree was swept and nothing was found.                           - One plastic stent was placed into the common bile  duct. Moderate Sedation:      GETA Recommendation:           - Avoid aspirin and nonsteroidal anti-inflammatory                            medicines for 1 week.                           - Watch for pancreatitis, bleeding, perforation,                            and cholangitis.                           Remain in hospital overnight for observation.                           - Outpatient referral to academic center for                            advanced biliary endoscopist for stone extraction. Procedure Code(s):        --- Professional ---                            [phone], Endoscopic retrograde                            cholangiopancreatography (ERCP); with placement of                            endoscopic stent into biliary or pancreatic duct,                            including pre- and post-dilation and guide wire                            passage, when performed, including sphincterotomy,                            when performed, each stent Diagnosis Code(s):        --- Professional ---                           K80.50, Calculus of bile duct without cholangitis                            or cholecystitis without obstruction                           K83.8, Other specified diseases of biliary tract CPT copyright 2016 [employer]. All rights reserved. The codes documented in this report are preliminary and upon coder review may  be revised to meet current compliance requirements. Evadna Donaghy L. Loletha Carrow, MD 08/08/2016 2:39:23 PM This report has been signed electronically. Number of Addenda: 0

## 2016-08-08 NOTE — Interval H&P Note (Signed)
History and Physical Interval Note:  08/08/2016 12:51 PM  Chelsea Cannon  has presented today for surgery, with the diagnosis of CBD stones  The various methods of treatment have been discussed with the patient and family. After consideration of risks, benefits and other options for treatment, the patient has consented to  Procedure(s): ENDOSCOPIC RETROGRADE CHOLANGIOPANCREATOGRAPHY (ERCP) (N/A) as a surgical intervention .  The patient's history has been reviewed, patient examined, no change in status, stable for surgery.  I have reviewed the patient's chart and labs.  Questions were answered to the patient's satisfaction.     Nelida Meuse III

## 2016-08-08 NOTE — Anesthesia Preprocedure Evaluation (Addendum)
Anesthesia Evaluation  Patient identified by MRN, date of birth, ID band Patient awake    Reviewed: Allergy & Precautions, H&P , NPO status , Patient's Chart, lab work & pertinent test results  Airway Mallampati: II   Neck ROM: full    Dental  (+) Poor Dentition, Dental Advidsory Given   Pulmonary asthma ,    breath sounds clear to auscultation       Cardiovascular negative cardio ROS   Rhythm:regular Rate:Normal     Neuro/Psych    GI/Hepatic S/p cholecystectomy 02/2016.  Retained stones in duct   Endo/Other    Renal/GU      Musculoskeletal   Abdominal   Peds  Hematology  (+) anemia ,   Anesthesia Other Findings   Reproductive/Obstetrics                            Anesthesia Physical Anesthesia Plan  ASA: II  Anesthesia Plan: General   Post-op Pain Management:    Induction: Intravenous  Airway Management Planned: Oral ETT  Additional Equipment:   Intra-op Plan:   Post-operative Plan: Extubation in OR  Informed Consent: I have reviewed the patients History and Physical, chart, labs and discussed the procedure including the risks, benefits and alternatives for the proposed anesthesia with the patient or authorized representative who has indicated his/her understanding and acceptance.   Dental Advisory Given  Plan Discussed with: CRNA, Anesthesiologist and Surgeon  Anesthesia Plan Comments:        Anesthesia Quick Evaluation

## 2016-08-08 NOTE — Anesthesia Procedure Notes (Signed)
Procedure Name: Intubation Date/Time: 08/08/2016 12:47 PM Performed by: Neldon Newport Pre-anesthesia Checklist: Timeout performed, Patient being monitored, Suction available, Emergency Drugs available and Patient identified Patient Re-evaluated:Patient Re-evaluated prior to inductionOxygen Delivery Method: Circle system utilized Preoxygenation: Pre-oxygenation with 100% oxygen Intubation Type: IV induction Ventilation: Mask ventilation without difficulty Laryngoscope Size: 3 and McGraph Grade View: Grade I Tube type: Oral Tube size: 7.0 mm Number of attempts: 1 Placement Confirmation: breath sounds checked- equal and bilateral,  positive ETCO2 and ETT inserted through vocal cords under direct vision Secured at: 21 cm Tube secured with: Tape Dental Injury: Teeth and Oropharynx as per pre-operative assessment

## 2016-08-08 NOTE — Anesthesia Postprocedure Evaluation (Signed)
Anesthesia Post Note  Patient: Chelsea Cannon  Procedure(Cannon) Performed: Procedure(Cannon) (LRB): ENDOSCOPIC RETROGRADE CHOLANGIOPANCREATOGRAPHY (ERCP) (N/A)  Patient location during evaluation: PACU Anesthesia Type: General Level of consciousness: awake and alert and patient cooperative Pain management: pain level controlled Vital Signs Assessment: post-procedure vital signs reviewed and stable Respiratory status: spontaneous breathing and respiratory function stable Cardiovascular status: stable Anesthetic complications: no        Last Vitals:  Vitals:   08/08/16 1510 08/08/16 1526  BP: 133/78 126/72  Pulse: 76 74  Resp: 18 18  Temp:  36.8 C    Last Pain:  Vitals:   08/08/16 1526  TempSrc: Oral   Pain Goal:                 Chelsea Cannon

## 2016-08-08 NOTE — H&P (View-Only) (Signed)
Patient ID: Chelsea Cannon, female   DOB: 1966/02/24, 51 y.o.   MRN: [id number]    Progress Note   Subjective  Feels Ok, denies nausea,vomiting or pain, t max 99.1  MRCP findings reviewed with pt   Objective   Vital signs in last 24 hours: Temp:  [97.9 F (36.6 C)-99.1 F (37.3 C)] 97.9 F (36.6 C) (02/16 0450) Pulse Rate:  [68-96] 68 (02/16 0450) Resp:  [16-18] 18 (02/16 0450) BP: (103-149)/(60-97) 103/60 (02/16 0450) SpO2:  [97 %-100 %] 100 % (02/16 0450) Weight:  [157 lb (71.2 kg)-158 lb 1.1 oz (71.7 kg)] 158 lb 1.1 oz (71.7 kg) (02/16 0214) Last BM Date: 08/06/16 General:   Jaundiced  white female in NAD,pleasant Heart:  Regular rate and rhythm; no murmurs Lungs: Respirations even and unlabored, lungs CTA bilaterally Abdomen:  Soft, nontender and nondistended. Normal bowel sounds. Extremities:  Without edema, jaundiced. Neurologic:  Alert and oriented,  grossly normal neurologically. Psych:  Cooperative. Normal mood and affect.  Intake/Output from previous day: 02/15 0701 - 02/16 0700 In: 741.7 [I.V.:741.7] Out: 550 [Urine:550] Intake/Output this shift: Total I/O In: 0  Out: 450 [Urine:450]  Lab Results:  Recent Labs  08/07/16 1135 08/08/16 0622  WBC 7.8 7.8  HGB 11.8* 10.4*  HCT 35.6* 32.0*  PLT 478* 408*   BMET  Recent Labs  08/07/16 1135 08/08/16 0622  NA 138 138  K 4.0 3.7  CL 104 108  CO2 24 20*  GLUCOSE 110* 96  BUN 5* 10  CREATININE 0.83 0.76  CALCIUM 10.1 9.3   LFT  Recent Labs  08/07/16 1135 08/07/16 1629 08/08/16 0622  PROT 8.1  --  6.8  ALBUMIN 3.9  --  3.3*  AST 895*  --  867*  ALT 1,171*  --  1,043*  ALKPHOS 587*  --  504*  BILITOT 9.1*  --  8.7*  BILIDIR 5.5* 5.4*  --   IBILI 3.6*  --   --    PT/INR  Recent Labs  08/07/16 1135 08/08/16 0622  LABPROT 13.6 14.5  INR 1.03 1.13    Studies/Results: Mr 3d Recon At Scanner  Result Date: 08/08/2016 CLINICAL DATA:  Painless jaundice. Status post laparoscopic  cholecystectomy 03/22/2016. EXAM: MRI ABDOMEN WITHOUT AND WITH CONTRAST (INCLUDING MRCP) TECHNIQUE: Multiplanar multisequence MR imaging of the abdomen was performed both before and after the administration of intravenous contrast. Heavily T2-weighted images of the biliary and pancreatic ducts were obtained, and three-dimensional MRCP images were rendered by post processing. CONTRAST:  63mL MULTIHANCE GADOBENATE DIMEGLUMINE 529 MG/ML IV SOLN COMPARISON:  03/20/2016 FINDINGS: Lower chest: Normal heart size without pericardial or pleural effusion. Hepatobiliary: 2 right hepatic lobe hemangiomas are again identified. Status post cholecystectomy. Moderate-to-marked intrahepatic biliary duct dilatation. A probable stone in the central aspect of the right hepatic duct. Example 6 mm on image 34/series 5 and image 25/series 3. The common duct is moderately dilated, including at 16 mm on image 17/ series 6. Distal common duct stones measure 6 and 9 mm respectively on series 6. Pancreas:  Normal, without mass or ductal dilatation. Spleen:  Normal in size, without focal abnormality. Adrenals/Urinary Tract: Normal adrenal glands. Normal left kidney. Subcentimeter interpolar right renal cysts. Stomach/Bowel: Normal stomach and abdominal bowel loops. Vascular/Lymphatic: Normal caliber of the aorta and branch vessels. No retroperitoneal or retrocrural adenopathy. Other:  No ascites. Musculoskeletal: No acute osseous abnormality. IMPRESSION: 1. Two distal common duct stones with resultant biliary duct dilatation. 2. Probable central right common hepatic duct  stone. This was not described in the preliminary report. 3. Right hepatic lobe hemangiomas. These results will be called to the ordering clinician or representative by the Radiologist Assistant, and communication documented in the PACS or zVision Dashboard. Electronically Signed   By: Abigail Miyamoto M.D.   On: 08/08/2016 07:14   Mr Abdomen Mrcp Moise Boring Contast  Result Date:  08/08/2016 CLINICAL DATA:  Painless jaundice. Status post laparoscopic cholecystectomy 03/22/2016. EXAM: MRI ABDOMEN WITHOUT AND WITH CONTRAST (INCLUDING MRCP) TECHNIQUE: Multiplanar multisequence MR imaging of the abdomen was performed both before and after the administration of intravenous contrast. Heavily T2-weighted images of the biliary and pancreatic ducts were obtained, and three-dimensional MRCP images were rendered by post processing. CONTRAST:  20mL MULTIHANCE GADOBENATE DIMEGLUMINE 529 MG/ML IV SOLN COMPARISON:  03/20/2016 FINDINGS: Lower chest: Normal heart size without pericardial or pleural effusion. Hepatobiliary: 2 right hepatic lobe hemangiomas are again identified. Status post cholecystectomy. Moderate-to-marked intrahepatic biliary duct dilatation. A probable stone in the central aspect of the right hepatic duct. Example 6 mm on image 34/series 5 and image 25/series 3. The common duct is moderately dilated, including at 16 mm on image 17/ series 6. Distal common duct stones measure 6 and 9 mm respectively on series 6. Pancreas:  Normal, without mass or ductal dilatation. Spleen:  Normal in size, without focal abnormality. Adrenals/Urinary Tract: Normal adrenal glands. Normal left kidney. Subcentimeter interpolar right renal cysts. Stomach/Bowel: Normal stomach and abdominal bowel loops. Vascular/Lymphatic: Normal caliber of the aorta and branch vessels. No retroperitoneal or retrocrural adenopathy. Other:  No ascites. Musculoskeletal: No acute osseous abnormality. IMPRESSION: 1. Two distal common duct stones with resultant biliary duct dilatation. 2. Probable central right common hepatic duct stone. This was not described in the preliminary report. 3. Right hepatic lobe hemangiomas. These results will be called to the ordering clinician or representative by the Radiologist Assistant, and communication documented in the PACS or zVision Dashboard. Electronically Signed   By: Abigail Miyamoto M.D.   On:  08/08/2016 07:14       Assessment / Plan:     #1 51 yo female admitted yesterday with jaundice- pt is s/p lap chole 02/2016 MRCP 02/2016 showed no CBD stones  MRCP last pm reveals 2 distal CBD stones and one possible stone in right common hepatic duct   Plan; Pt is scheduled for ERCP /stone extraction with Dr Loletha Carrow at 1 pm today  Procedure discussed in detail with pt including risks/benefits including 5% risk of pancreatitis. She is agreable to proceed. IV Unasyn ordered pre- procedure Repeat labs in am  Active Problems:   Common bile duct dilatation   Fatty liver disease, nonalcoholic   Elevated LFTs   Jaundice   Asthma   Anemia   Menorrhagia     LOS: 1 day   Alicea Wente  08/08/2016, 9:54 AM

## 2016-08-09 DIAGNOSIS — K8050 Calculus of bile duct without cholangitis or cholecystitis without obstruction: Secondary | ICD-10-CM

## 2016-08-09 LAB — COMPREHENSIVE METABOLIC PANEL
ALBUMIN: 2.9 g/dL — AB (ref 3.5–5.0)
ALK PHOS: 450 U/L — AB (ref 38–126)
ALT: 912 U/L — ABNORMAL HIGH (ref 14–54)
ANION GAP: 5 (ref 5–15)
AST: 702 U/L — ABNORMAL HIGH (ref 15–41)
BUN: 9 mg/dL (ref 6–20)
CALCIUM: 8.7 mg/dL — AB (ref 8.9–10.3)
CHLORIDE: 109 mmol/L (ref 101–111)
CO2: 25 mmol/L (ref 22–32)
Creatinine, Ser: 0.79 mg/dL (ref 0.44–1.00)
GFR calc Af Amer: >60 mL/min (ref >60)
GFR calc non Af Amer: >60 mL/min (ref >60)
GLUCOSE: 140 mg/dL — AB (ref 65–99)
POTASSIUM: 3.5 mmol/L (ref 3.5–5.1)
SODIUM: 139 mmol/L (ref 135–145)
Total Bilirubin: 3.7 mg/dL — ABNORMAL HIGH (ref 0.3–1.2)
Total Protein: 6.2 g/dL — ABNORMAL LOW (ref 6.5–8.1)

## 2016-08-09 LAB — CBC
HCT: 30.3 % — ABNORMAL LOW (ref 36.0–46.0)
HEMOGLOBIN: 9.8 g/dL — AB (ref 12.0–15.0)
MCH: 26.8 pg (ref 26.0–34.0)
MCHC: 32.3 g/dL (ref 30.0–36.0)
MCV: 82.8 fL (ref 78.0–100.0)
PLATELETS: 396 10*3/uL (ref 150–400)
RBC: 3.66 MIL/uL — ABNORMAL LOW (ref 3.87–5.11)
RDW: 15.3 % (ref 11.5–15.5)
WBC: 9.9 10*3/uL (ref 4.0–10.5)

## 2016-08-09 LAB — LIPASE, BLOOD: Lipase: 962 U/L — ABNORMAL HIGH (ref 11–51)

## 2016-08-09 NOTE — Progress Notes (Signed)
[facility]                                                                              Patient Demographics  Chelsea Cannon, is a 51 y.o. female, DOB - [birth year], [id number]  Admit date - 08/07/2016   Admitting Physician Chelsea Mocha, MD  Outpatient Primary MD for the patient is Chelsea Spruce, MD  Outpatient specialists:   LOS - 2  days    No chief complaint on file.      Brief summary   Patient is a 51 year old female with asthma, cholecystectomy was seen at [facility] for generalized malaise, weakness. Labs drawn showed significant transaminitis and was transferred to [facility] for further workup. Outpatient abdominal ultrasound showed blocked hepatic duct. GI was consulted.   Assessment & Plan   Painless jaundice with Obstructive jaundice due to choledocholithiasis -  Patient is s/p cholecystectomy in 03/2016  - Outpatient abdominal ultrasound showed 1.3 cm dilated CBD without obvious choledocolithiasis, small cystic lucency in the region of the GB fossa at the CBD. No history of alcohol. - Antismooth AB 11, ANA negative,  Acute hepatitis panel negative - GI was consulted, patient underwent MRCP which showed 2 CBD stones.  - ERCP done with unsuccessful balloon extraction, stent placed and biliary sphincterotomy.  Abdominal pain - LFTs improving, lipase checked, elevated likely due to Pancreatitis (possibly caused d/t ERCP) - Placed on IV fluid hydration, clear liquid diet   History of Asthma, remote - currently stable, no wheezing  Continue O2 prn   history of Menorrhagia, last menstrual period 1 month ago, but has not ceased till 2/12  -  MCV normal. Hb 11.8,  at baseline  - Outpatient follow-up with OB/GYN   Code Status: full code  DVT Prophylaxis:   SCD's Family Communication: Discussed in detail with the patient, all imaging results, lab results explained to the patient And daughter in the room  Disposition Plan: When  cleared by GI  Time Spent in minutes 25 minutes  Procedures:   MRCP ERCP   Consultants:    Gastroenterology  Antimicrobials:    Unasyn   Medications  Scheduled Meds: . famotidine  20 mg Oral Daily   Continuous Infusions: . sodium chloride 100 mL/hr at 08/08/16 0005   PRN Meds:.acetaminophen **OR** acetaminophen, albuterol, bisacodyl, HYDROcodone-acetaminophen, magnesium citrate, ondansetron **OR** ondansetron (ZOFRAN) IV, senna-docusate, traZODone   Antibiotics   Anti-infectives    Start     Dose/Rate Route Frequency Ordered Stop   08/08/16 1015  Ampicillin-Sulbactam (UNASYN) 3 g in sodium chloride 0.9 % 100 mL IVPB     3 g 200 mL/hr over 30 Minutes Intravenous To Endoscopy 08/08/16 0958 08/08/16 1321        Subjective:   Chelsea Cannon was seen and examined today. Complaining of epigastric abdominal pain radiating to the left upper quadrant this morning. Still having scleral icterus and jaundice. Patient denies dizziness, chest pain, shortness of breath, new weakness, numbess, tingling. No acute events overnight.  No vomiting or diarrhea.  Objective:   Vitals:   08/09/16 0235 08/09/16 0500 08/09/16 0511 08/09/16 0955  BP: (!) 105/58  114/66  120/60  Pulse: 70  81 74  Resp: 17  16 16   Temp: 98.2 F (36.8 C)  98.6 F (37 C) 98 F (36.7 C)  TempSrc: Oral  Oral Oral  SpO2: 99%  96% 99%  Weight:  73.3 kg (161 lb 9.6 oz)    Height:        Intake/Output Summary (Last 24 hours) at 08/09/16 1152 Last data filed at 08/09/16 0956  Gross per 24 hour  Intake          4926.67 ml  Output             2615 ml  Net          2311.67 ml     Wt Readings from Last 3 Encounters:  08/09/16 73.3 kg (161 lb 9.6 oz)  03/20/16 70.8 kg (156 lb 1.4 oz)  02/12/13 63.5 kg (140 lb)     Exam  General: Alert and oriented x 3, NAD, Jaundice  HEENT:  PERRLA, EOMI, icteric Sclera, mucous membranes moist.   Neck:   Cardiovascular: S1 S2 auscultated, no rubs, murmurs or  gallops. Regular rate and rhythm.  Respiratory: Clear to auscultation bilaterally, no wheezing, rales or rhonchi  Gastrointestinal: Soft, Tender in the epigastric and left upper quadrant area. nondistended, + bowel sounds  Ext: no cyanosis clubbing or edema  Neuro: no new deficits   Skin: No rashes  Psych: Normal affect and demeanor, alert and oriented x3    Data Reviewed:  I have personally reviewed following labs and imaging studies  Micro Results No results found for this or any previous visit (from the past 240 hour(s)).  Radiology Reports Mr 3d Recon At Scanner  Result Date: 08/08/2016 CLINICAL DATA:  Painless jaundice. Status post laparoscopic cholecystectomy 03/22/2016. EXAM: MRI ABDOMEN WITHOUT AND WITH CONTRAST (INCLUDING MRCP) TECHNIQUE: Multiplanar multisequence MR imaging of the abdomen was performed both before and after the administration of intravenous contrast. Heavily T2-weighted images of the biliary and pancreatic ducts were obtained, and three-dimensional MRCP images were rendered by post processing. CONTRAST:  46mL MULTIHANCE GADOBENATE DIMEGLUMINE 529 MG/ML IV SOLN COMPARISON:  03/20/2016 FINDINGS: Lower chest: Normal heart size without pericardial or pleural effusion. Hepatobiliary: 2 right hepatic lobe hemangiomas are again identified. Status post cholecystectomy. Moderate-to-marked intrahepatic biliary duct dilatation. A probable stone in the central aspect of the right hepatic duct. Example 6 mm on image 34/series 5 and image 25/series 3. The common duct is moderately dilated, including at 16 mm on image 17/ series 6. Distal common duct stones measure 6 and 9 mm respectively on series 6. Pancreas:  Normal, without mass or ductal dilatation. Spleen:  Normal in size, without focal abnormality. Adrenals/Urinary Tract: Normal adrenal glands. Normal left kidney. Subcentimeter interpolar right renal cysts. Stomach/Bowel: Normal stomach and abdominal bowel loops.  Vascular/Lymphatic: Normal caliber of the aorta and branch vessels. No retroperitoneal or retrocrural adenopathy. Other:  No ascites. Musculoskeletal: No acute osseous abnormality. IMPRESSION: 1. Two distal common duct stones with resultant biliary duct dilatation. 2. Probable central right common hepatic duct stone. This was not described in the preliminary report. 3. Right hepatic lobe hemangiomas. These results will be called to the ordering clinician or representative by the Radiologist Assistant, and communication documented in the PACS or zVision Dashboard. Electronically Signed   By: Abigail Miyamoto M.D.   On: 08/08/2016 07:14   Dg Ercp With Sphincterotomy  Result Date: 08/08/2016 CLINICAL DATA:  Choledocholithiasis. EXAM: ERCP TECHNIQUE: Multiple spot images obtained with the fluoroscopic device  and submitted for interpretation post-procedure. COMPARISON:  MRI/ MRCP exam on 08/07/2016 FINDINGS: C-arm images during ERCP demonstrate cannulation and injection of the common bile duct demonstrating diffuse dilatation of the duct and at least 1 filling defect consistent with choledocholithiasis. Balloon sweep maneuver was performed. A biliary stent was placed in the common bile duct. IMPRESSION: ERCP confirms the presence of choledocholithiasis. Balloon sweep maneuver was performed and a biliary stent placed. These images were submitted for radiologic interpretation only. Please see the procedural report for the amount of contrast and the fluoroscopy time utilized. Electronically Signed   By: Aletta Edouard M.D.   On: 08/08/2016 15:35   Mr Abdomen Mrcp Moise Boring Contast  Result Date: 08/08/2016 CLINICAL DATA:  Painless jaundice. Status post laparoscopic cholecystectomy 03/22/2016. EXAM: MRI ABDOMEN WITHOUT AND WITH CONTRAST (INCLUDING MRCP) TECHNIQUE: Multiplanar multisequence MR imaging of the abdomen was performed both before and after the administration of intravenous contrast. Heavily T2-weighted images of  the biliary and pancreatic ducts were obtained, and three-dimensional MRCP images were rendered by post processing. CONTRAST:  22mL MULTIHANCE GADOBENATE DIMEGLUMINE 529 MG/ML IV SOLN COMPARISON:  03/20/2016 FINDINGS: Lower chest: Normal heart size without pericardial or pleural effusion. Hepatobiliary: 2 right hepatic lobe hemangiomas are again identified. Status post cholecystectomy. Moderate-to-marked intrahepatic biliary duct dilatation. A probable stone in the central aspect of the right hepatic duct. Example 6 mm on image 34/series 5 and image 25/series 3. The common duct is moderately dilated, including at 16 mm on image 17/ series 6. Distal common duct stones measure 6 and 9 mm respectively on series 6. Pancreas:  Normal, without mass or ductal dilatation. Spleen:  Normal in size, without focal abnormality. Adrenals/Urinary Tract: Normal adrenal glands. Normal left kidney. Subcentimeter interpolar right renal cysts. Stomach/Bowel: Normal stomach and abdominal bowel loops. Vascular/Lymphatic: Normal caliber of the aorta and branch vessels. No retroperitoneal or retrocrural adenopathy. Other:  No ascites. Musculoskeletal: No acute osseous abnormality. IMPRESSION: 1. Two distal common duct stones with resultant biliary duct dilatation. 2. Probable central right common hepatic duct stone. This was not described in the preliminary report. 3. Right hepatic lobe hemangiomas. These results will be called to the ordering clinician or representative by the Radiologist Assistant, and communication documented in the PACS or zVision Dashboard. Electronically Signed   By: Abigail Miyamoto M.D.   On: 08/08/2016 07:14    Lab Data:  CBC:  Recent Labs Lab 08/07/16 1135 08/08/16 0622 08/09/16 0754  WBC 7.8 7.8 9.9  NEUTROABS 4.8  --   --   HGB 11.8* 10.4* 9.8*  HCT 35.6* 32.0* 30.3*  MCV 82.4 81.8 82.8  PLT 478* 408* [id number]   Basic Metabolic Panel:  Recent Labs Lab 08/07/16 1135 08/08/16 0622 08/09/16 0754    NA 138 138 139  K 4.0 3.7 3.5  CL 104 108 109  CO2 24 20* 25  GLUCOSE 110* 96 140*  BUN 5* 10 9  CREATININE 0.83 0.76 0.79  CALCIUM 10.1 9.3 8.7*   GFR: Estimated Creatinine Clearance: 85.2 mL/min (by C-G formula based on SCr of 0.79 mg/dL). Liver Function Tests:  Recent Labs Lab 08/07/16 1135 08/08/16 0622 08/09/16 0754  AST 895* 867* 702*  ALT 1,171* 1,043* 912*  ALKPHOS 587* 504* 450*  BILITOT 9.1* 8.7* 3.7*  PROT 8.1 6.8 6.2*  ALBUMIN 3.9 3.3* 2.9*    Recent Labs Lab 08/09/16 0759  LIPASE 962*   No results for input(s): AMMONIA in the last 168 hours. Coagulation Profile:  Recent Labs Lab  08/07/16 1135 08/08/16 0622  INR 1.03 1.13   Cardiac Enzymes: No results for input(s): CKTOTAL, CKMB, CKMBINDEX, TROPONINI in the last 168 hours. BNP (last 3 results) No results for input(s): PROBNP in the last 8760 hours. HbA1C:  Recent Labs  08/07/16 1629  HGBA1C 5.4   CBG: No results for input(s): GLUCAP in the last 168 hours. Lipid Profile: No results for input(s): CHOL, HDL, LDLCALC, TRIG, CHOLHDL, LDLDIRECT in the last 72 hours. Thyroid Function Tests: No results for input(s): TSH, T4TOTAL, FREET4, T3FREE, THYROIDAB in the last 72 hours. Anemia Panel: No results for input(s): VITAMINB12, FOLATE, FERRITIN, TIBC, IRON, RETICCTPCT in the last 72 hours. Urine analysis: No results found for: COLORURINE, APPEARANCEUR, LABSPEC, PHURINE, GLUCOSEU, HGBUR, BILIRUBINUR, KETONESUR, PROTEINUR, UROBILINOGEN, NITRITE, Corliss Skains M.D. [facility] 08/09/2016, 11:52 AM  Pager: [phone] Between 7am to 7pm - call Pager - [phone]  After 7pm go to [email or web address] - password [email or web address]  Call night coverage person covering after 7pm

## 2016-08-09 NOTE — Progress Notes (Signed)
[facility] ROUNDING NOTE   Subjective: Patient was eating fruit loops with milk this Am and developed mild epigastric discomfort after eating. S  Objective: Vital signs in last 24 hours: Temp:  [98 F (36.7 C)-98.6 F (37 C)] 98 F (36.7 C) (02/17 0955) Pulse Rate:  [70-94] 74 (02/17 0955) Resp:  [16-20] 16 (02/17 0955) BP: (105-138)/(58-78) 120/60 (02/17 0955) SpO2:  [96 %-100 %] 99 % (02/17 0955) Weight:  [161 lb 9.6 oz (73.3 kg)] 161 lb 9.6 oz (73.3 kg) (02/17 0500) Last BM Date: 08/06/16 General: NAD Lungs: clear Heart: s1s2  Abdomen: soft NTND Ext: no edema    Intake/Output from previous day: 02/16 0701 - 02/17 0700 In: 4686.7 [P.O.:880; I.V.:3806.7] Out: 3365 [Urine:3350; Blood:15] Intake/Output this shift: Total I/O In: 240 [P.O.:240] Out: 700 [Urine:700]   Lab Results:  Recent Labs  08/07/16 1135 08/08/16 0622 08/09/16 0754  WBC 7.8 7.8 9.9  HGB 11.8* 10.4* 9.8*  PLT 478* 408* 396  MCV 82.4 81.8 82.8   BMET  Recent Labs  08/07/16 1135 08/08/16 0622 08/09/16 0754  NA 138 138 139  K 4.0 3.7 3.5  CL 104 108 109  CO2 24 20* 25  GLUCOSE 110* 96 140*  BUN 5* 10 9  CREATININE 0.83 0.76 0.79  CALCIUM 10.1 9.3 8.7*   LFT  Recent Labs  08/07/16 1135 08/07/16 1629 08/08/16 0622 08/09/16 0754  PROT 8.1  --  6.8 6.2*  ALBUMIN 3.9  --  3.3* 2.9*  AST 895*  --  867* 702*  ALT 1,171*  --  1,043* 912*  ALKPHOS 587*  --  504* 450*  BILITOT 9.1*  --  8.7* 3.7*  BILIDIR 5.5* 5.4*  --   --   IBILI 3.6*  --   --   --    PT/INR  Recent Labs  08/07/16 1135 08/08/16 0622  INR 1.03 1.13      Imaging/Other results: Mr 3d Recon At Scanner  Result Date: 08/08/2016 CLINICAL DATA:  Painless jaundice. Status post laparoscopic cholecystectomy 03/22/2016. EXAM: MRI ABDOMEN WITHOUT AND WITH CONTRAST (INCLUDING MRCP) TECHNIQUE: Multiplanar multisequence MR imaging of the abdomen was performed both before and after the administration of  intravenous contrast. Heavily T2-weighted images of the biliary and pancreatic ducts were obtained, and three-dimensional MRCP images were rendered by post processing. CONTRAST:  33m MULTIHANCE GADOBENATE DIMEGLUMINE 529 MG/ML IV SOLN COMPARISON:  03/20/2016 FINDINGS: Lower chest: Normal heart size without pericardial or pleural effusion. Hepatobiliary: 2 right hepatic lobe hemangiomas are again identified. Status post cholecystectomy. Moderate-to-marked intrahepatic biliary duct dilatation. A probable stone in the central aspect of the right hepatic duct. Example 6 mm on image 34/series 5 and image 25/series 3. The common duct is moderately dilated, including at 16 mm on image 17/ series 6. Distal common duct stones measure 6 and 9 mm respectively on series 6. Pancreas:  Normal, without mass or ductal dilatation. Spleen:  Normal in size, without focal abnormality. Adrenals/Urinary Tract: Normal adrenal glands. Normal left kidney. Subcentimeter interpolar right renal cysts. Stomach/Bowel: Normal stomach and abdominal bowel loops. Vascular/Lymphatic: Normal caliber of the aorta and branch vessels. No retroperitoneal or retrocrural adenopathy. Other:  No ascites. Musculoskeletal: No acute osseous abnormality. IMPRESSION: 1. Two distal common duct stones with resultant biliary duct dilatation. 2. Probable central right common hepatic duct stone. This was not described in the preliminary report. 3. Right hepatic lobe hemangiomas. These results will be called to the ordering clinician or representative by the Radiologist Assistant, and  communication documented in the PACS or zVision Dashboard. Electronically Signed   By: Abigail Miyamoto M.D.   On: 08/08/2016 07:14   Dg Ercp With Sphincterotomy  Result Date: 08/08/2016 CLINICAL DATA:  Choledocholithiasis. EXAM: ERCP TECHNIQUE: Multiple spot images obtained with the fluoroscopic device and submitted for interpretation post-procedure. COMPARISON:  MRI/ MRCP exam on  08/07/2016 FINDINGS: C-arm images during ERCP demonstrate cannulation and injection of the common bile duct demonstrating diffuse dilatation of the duct and at least 1 filling defect consistent with choledocholithiasis. Balloon sweep maneuver was performed. A biliary stent was placed in the common bile duct. IMPRESSION: ERCP confirms the presence of choledocholithiasis. Balloon sweep maneuver was performed and a biliary stent placed. These images were submitted for radiologic interpretation only. Please see the procedural report for the amount of contrast and the fluoroscopy time utilized. Electronically Signed   By: Aletta Edouard M.D.   On: 08/08/2016 15:35   Mr Abdomen Mrcp Moise Boring Contast  Result Date: 08/08/2016 CLINICAL DATA:  Painless jaundice. Status post laparoscopic cholecystectomy 03/22/2016. EXAM: MRI ABDOMEN WITHOUT AND WITH CONTRAST (INCLUDING MRCP) TECHNIQUE: Multiplanar multisequence MR imaging of the abdomen was performed both before and after the administration of intravenous contrast. Heavily T2-weighted images of the biliary and pancreatic ducts were obtained, and three-dimensional MRCP images were rendered by post processing. CONTRAST:  9m MULTIHANCE GADOBENATE DIMEGLUMINE 529 MG/ML IV SOLN COMPARISON:  03/20/2016 FINDINGS: Lower chest: Normal heart size without pericardial or pleural effusion. Hepatobiliary: 2 right hepatic lobe hemangiomas are again identified. Status post cholecystectomy. Moderate-to-marked intrahepatic biliary duct dilatation. A probable stone in the central aspect of the right hepatic duct. Example 6 mm on image 34/series 5 and image 25/series 3. The common duct is moderately dilated, including at 16 mm on image 17/ series 6. Distal common duct stones measure 6 and 9 mm respectively on series 6. Pancreas:  Normal, without mass or ductal dilatation. Spleen:  Normal in size, without focal abnormality. Adrenals/Urinary Tract: Normal adrenal glands. Normal left kidney.  Subcentimeter interpolar right renal cysts. Stomach/Bowel: Normal stomach and abdominal bowel loops. Vascular/Lymphatic: Normal caliber of the aorta and branch vessels. No retroperitoneal or retrocrural adenopathy. Other:  No ascites. Musculoskeletal: No acute osseous abnormality. IMPRESSION: 1. Two distal common duct stones with resultant biliary duct dilatation. 2. Probable central right common hepatic duct stone. This was not described in the preliminary report. 3. Right hepatic lobe hemangiomas. These results will be called to the ordering clinician or representative by the Radiologist Assistant, and communication documented in the PACS or zVision Dashboard. Electronically Signed   By: KAbigail MiyamotoM.D.   On: 08/08/2016 07:14      Assessment &Plan  89yr F with h/o cholelithiasis s/p cholecystectomy 02/2016 admitted with jaundice noted to have CBD stones s/p ERCP yesterday, noted significant dilation of CBD, biliary sphincterotomy was performed but unable to extract the large 12 mm CBD stone, 1109fcm plastic biliary stent was placed.  Patient is doing better clinically, lipase is only mildly elevated  Slowly advance diet as tolerated Bili and alk phos trending down Will need outpatient follow up, to be scheduled with our office in next 2-4 weeks and will also need repeat ERCP in 6-8 weeks to remove stent and stone extraction, [facility] (we will arrange for the follow up, advised patient to call our office next week to confirm) Ok to discharge home tomorrow from GI perspective   K. VeDenzil Magnuson MD  [phone] Mon-Fri 8a-5p [phone] after 5p, weekends, holidays [facility]

## 2016-08-10 LAB — COMPREHENSIVE METABOLIC PANEL
ALK PHOS: 381 U/L — AB (ref 38–126)
ALT: 781 U/L — AB (ref 14–54)
AST: 507 U/L — AB (ref 15–41)
Albumin: 2.9 g/dL — ABNORMAL LOW (ref 3.5–5.0)
Anion gap: 10 (ref 5–15)
BILIRUBIN TOTAL: 3.5 mg/dL — AB (ref 0.3–1.2)
BUN: 6 mg/dL (ref 6–20)
CO2: 21 mmol/L — ABNORMAL LOW (ref 22–32)
CREATININE: 0.71 mg/dL (ref 0.44–1.00)
Calcium: 8.8 mg/dL — ABNORMAL LOW (ref 8.9–10.3)
Chloride: 108 mmol/L (ref 101–111)
GFR calc Af Amer: >60 mL/min (ref >60)
GLUCOSE: 89 mg/dL (ref 65–99)
POTASSIUM: 3.9 mmol/L (ref 3.5–5.1)
Sodium: 139 mmol/L (ref 135–145)
TOTAL PROTEIN: 5.9 g/dL — AB (ref 6.5–8.1)

## 2016-08-10 LAB — LIPASE, BLOOD: LIPASE: 517 U/L — AB (ref 11–51)

## 2016-08-10 MED ORDER — TRAMADOL HCL 50 MG PO TABS: 50.0000 mg | ORAL_TABLET | Freq: Four times a day (QID) | ORAL | 0 refills | Status: DC | PRN

## 2016-08-10 MED ORDER — ONDANSETRON HCL 4 MG PO TABS: 4.0000 mg | ORAL_TABLET | Freq: Four times a day (QID) | ORAL | 0 refills | Status: DC | PRN

## 2016-08-10 NOTE — Discharge Summary (Signed)
Physician Discharge Summary   Patient ID: Chelsea Cannon MRN: [id number] DOB/AGE: 08/06/1965 51 y.o.  Admit date: 08/07/2016 Discharge date: 08/10/2016  Primary Care Physician:  Lindie Spruce, MD  Discharge Diagnoses:    . Obstructive jaundice due to choledocholithiasis   Pancreatitis secondary to choledocholithiasis and status post ERCP . Fatty liver disease, nonalcoholic . Common bile duct dilatation   Consults: Gastroenterology  Recommendations for Outpatient Follow-up:  1. Patient will need repeat ERCP and stone extraction/stent removal, GI office will arrange 2. Please repeat CBC/BMET at next visit   DIET: Full liquid diet or low-fat diet    Allergies:  No Known Allergies   DISCHARGE MEDICATIONS: Current Discharge Medication List    START taking these medications   Details  ondansetron (ZOFRAN) 4 MG tablet Take 1 tablet (4 mg total) by mouth every 6 (six) hours as needed for nausea. Qty: 20 tablet, Refills: 0    traMADol (ULTRAM) 50 MG tablet Take 1 tablet (50 mg total) by mouth every 6 (six) hours as needed for severe pain. Qty: 30 tablet, Refills: 0      CONTINUE these medications which have NOT CHANGED   Details  Famotidine (PEPCID PO) Take 2 tablets by mouth daily as needed (heartburn).      STOP taking these medications     oxyCODONE (OXY IR/ROXICODONE) 5 MG immediate release tablet      polyethylene glycol (MIRALAX / GLYCOLAX) packet      ranitidine (ZANTAC) 150 MG tablet          Brief H and P: For complete details please refer to admission H and P, but in brief Patient is a 51 year old female with asthma, cholecystectomy was seen at [facility] for generalized malaise, weakness. Labs drawn showed significant transaminitis and was transferred to [facility] for further workup. Outpatient abdominal ultrasound showed blocked hepatic duct. GI was consulted.  Hospital Course:  Painless jaundice with Obstructive jaundice due to  choledocholithiasis - Patient is s/p cholecystectomy in 03/2016  - Outpatient abdominal ultrasound showed 1.3 cm dilated CBD without obvious choledocolithiasis, small cystic lucency in the region of the GB fossa at the CBD. No history of alcohol. - Antismooth AB 11, ANA negative, Acute hepatitis panel was negative - GI was consulted, patient underwent MRCP which showed 2 CBD stones.  - ERCP done with unsuccessful balloon extraction, pancreatic duct stent was placed with biliary sphincterotomy. Bilirubin down to 3.5. Dr Loletha Carrow will arrange referral to tertiary care center for repeat ERCP and stone extraction/stent removal, GI office will arrange appointments.  Abdominal pain/pancreatitis - Status post ERCP, next morning patient started complaining of abdominal pain LFTs improving however lipase was elevated to 962. elevated likely due to Pancreatitis (possibly caused d/t ERCP and choledocholithiasis) - Patient was placed on IV fluid hydration and clear liquid diet. She is now doing well and tolerating solid diet. She wants to go home, pain is controlled. She is cleared from gastroenterology to be discharged home.    History of Asthma,remote - currently stable, no wheezing  Continue O2 prn   history of Menorrhagia,last menstrual period 1 month ago, but has not ceased till 2/12  -  MCV normal. Hb 11.8,  at baseline  - Outpatient follow-up with OB/GYN     Day of Discharge BP 112/63 (BP Location: Right Arm)   Pulse 65   Temp 98.1 F (36.7 C) (Oral)   Resp 17   Ht 5\' 6"  (1.676 m)   Wt 73 kg (160 lb 15  oz)   LMP 07/17/2016 (Approximate)   SpO2 99%   BMI 25.98 kg/m   Physical Exam: General: Alert and awake oriented x3 not in any acute distress. HEENT: anicteric sclera, pupils reactive to light and accommodation CVS: S1-S2 clear no murmur rubs or gallops Chest: clear to auscultation bilaterally, no wheezing rales or rhonchi Abdomen: soft , minimal tenderness in the epigastric  region , nondistended, normal bowel sounds Extremities: no cyanosis, clubbing or edema noted bilaterally Neuro: Cranial nerves II-XII intact, no focal neurological deficits   The results of significant diagnostics from this hospitalization (including imaging, microbiology, ancillary and laboratory) are listed below for reference.    LAB RESULTS: Basic Metabolic Panel:  Recent Labs Lab 08/09/16 0754 08/10/16 0408  NA 139 139  K 3.5 3.9  CL 109 108  CO2 25 21*  GLUCOSE 140* 89  BUN 9 6  CREATININE 0.79 0.71  CALCIUM 8.7* 8.8*   Liver Function Tests:  Recent Labs Lab 08/09/16 0754 08/10/16 0408  AST 702* 507*  ALT 912* 781*  ALKPHOS 450* 381*  BILITOT 3.7* 3.5*  PROT 6.2* 5.9*  ALBUMIN 2.9* 2.9*    Recent Labs Lab 08/09/16 0759 08/10/16 0408  LIPASE 962* 517*   No results for input(s): AMMONIA in the last 168 hours. CBC:  Recent Labs Lab 08/07/16 1135 08/08/16 0622 08/09/16 0754  WBC 7.8 7.8 9.9  NEUTROABS 4.8  --   --   HGB 11.8* 10.4* 9.8*  HCT 35.6* 32.0* 30.3*  MCV 82.4 81.8 82.8  PLT 478* 408* 396   Cardiac Enzymes: No results for input(s): CKTOTAL, CKMB, CKMBINDEX, TROPONINI in the last 168 hours. BNP: Invalid input(s): POCBNP CBG: No results for input(s): GLUCAP in the last 168 hours.  Significant Diagnostic Studies:  Mr 3d Recon At Scanner  Result Date: 08/08/2016 CLINICAL DATA:  Painless jaundice. Status post laparoscopic cholecystectomy 03/22/2016. EXAM: MRI ABDOMEN WITHOUT AND WITH CONTRAST (INCLUDING MRCP) TECHNIQUE: Multiplanar multisequence MR imaging of the abdomen was performed both before and after the administration of intravenous contrast. Heavily T2-weighted images of the biliary and pancreatic ducts were obtained, and three-dimensional MRCP images were rendered by post processing. CONTRAST:  62mL MULTIHANCE GADOBENATE DIMEGLUMINE 529 MG/ML IV SOLN COMPARISON:  03/20/2016 FINDINGS: Lower chest: Normal heart size without pericardial  or pleural effusion. Hepatobiliary: 2 right hepatic lobe hemangiomas are again identified. Status post cholecystectomy. Moderate-to-marked intrahepatic biliary duct dilatation. A probable stone in the central aspect of the right hepatic duct. Example 6 mm on image 34/series 5 and image 25/series 3. The common duct is moderately dilated, including at 16 mm on image 17/ series 6. Distal common duct stones measure 6 and 9 mm respectively on series 6. Pancreas:  Normal, without mass or ductal dilatation. Spleen:  Normal in size, without focal abnormality. Adrenals/Urinary Tract: Normal adrenal glands. Normal left kidney. Subcentimeter interpolar right renal cysts. Stomach/Bowel: Normal stomach and abdominal bowel loops. Vascular/Lymphatic: Normal caliber of the aorta and branch vessels. No retroperitoneal or retrocrural adenopathy. Other:  No ascites. Musculoskeletal: No acute osseous abnormality. IMPRESSION: 1. Two distal common duct stones with resultant biliary duct dilatation. 2. Probable central right common hepatic duct stone. This was not described in the preliminary report. 3. Right hepatic lobe hemangiomas. These results will be called to the ordering clinician or representative by the Radiologist Assistant, and communication documented in the PACS or zVision Dashboard. Electronically Signed   By: Abigail Miyamoto M.D.   On: 08/08/2016 07:14   Dg Ercp With Sphincterotomy  Result Date: 08/08/2016 CLINICAL DATA:  Choledocholithiasis. EXAM: ERCP TECHNIQUE: Multiple spot images obtained with the fluoroscopic device and submitted for interpretation post-procedure. COMPARISON:  MRI/ MRCP exam on 08/07/2016 FINDINGS: C-arm images during ERCP demonstrate cannulation and injection of the common bile duct demonstrating diffuse dilatation of the duct and at least 1 filling defect consistent with choledocholithiasis. Balloon sweep maneuver was performed. A biliary stent was placed in the common bile duct. IMPRESSION: ERCP  confirms the presence of choledocholithiasis. Balloon sweep maneuver was performed and a biliary stent placed. These images were submitted for radiologic interpretation only. Please see the procedural report for the amount of contrast and the fluoroscopy time utilized. Electronically Signed   By: Aletta Edouard M.D.   On: 08/08/2016 15:35   Mr Abdomen Mrcp Moise Boring Contast  Result Date: 08/08/2016 CLINICAL DATA:  Painless jaundice. Status post laparoscopic cholecystectomy 03/22/2016. EXAM: MRI ABDOMEN WITHOUT AND WITH CONTRAST (INCLUDING MRCP) TECHNIQUE: Multiplanar multisequence MR imaging of the abdomen was performed both before and after the administration of intravenous contrast. Heavily T2-weighted images of the biliary and pancreatic ducts were obtained, and three-dimensional MRCP images were rendered by post processing. CONTRAST:  4mL MULTIHANCE GADOBENATE DIMEGLUMINE 529 MG/ML IV SOLN COMPARISON:  03/20/2016 FINDINGS: Lower chest: Normal heart size without pericardial or pleural effusion. Hepatobiliary: 2 right hepatic lobe hemangiomas are again identified. Status post cholecystectomy. Moderate-to-marked intrahepatic biliary duct dilatation. A probable stone in the central aspect of the right hepatic duct. Example 6 mm on image 34/series 5 and image 25/series 3. The common duct is moderately dilated, including at 16 mm on image 17/ series 6. Distal common duct stones measure 6 and 9 mm respectively on series 6. Pancreas:  Normal, without mass or ductal dilatation. Spleen:  Normal in size, without focal abnormality. Adrenals/Urinary Tract: Normal adrenal glands. Normal left kidney. Subcentimeter interpolar right renal cysts. Stomach/Bowel: Normal stomach and abdominal bowel loops. Vascular/Lymphatic: Normal caliber of the aorta and branch vessels. No retroperitoneal or retrocrural adenopathy. Other:  No ascites. Musculoskeletal: No acute osseous abnormality. IMPRESSION: 1. Two distal common duct stones with  resultant biliary duct dilatation. 2. Probable central right common hepatic duct stone. This was not described in the preliminary report. 3. Right hepatic lobe hemangiomas. These results will be called to the ordering clinician or representative by the Radiologist Assistant, and communication documented in the PACS or zVision Dashboard. Electronically Signed   By: Abigail Miyamoto M.D.   On: 08/08/2016 07:14    2D ECHO:   Disposition and Follow-up: Discharge Instructions    Diet - low sodium heart healthy    Complete by:  As directed    Discharge instructions    Complete by:  As directed    Diet: Full liquid or soft low fat diet   Increase activity slowly    Complete by:  As directed        DISPOSITION: [place]    Harl Bowie, MD. Schedule an appointment as soon as possible for a visit in 2 week(s).   Specialty:  Gastroenterology Why:  for hospital follow-up, need to discuss about repeat ERCP and referral [facility] Contact information: [place] [id number] [phone]            Time spent on Discharge: 25 minutes  Signed:   Farah Benish M.D. [facility] 08/10/2016, 10:34 AM Pager: [phone]

## 2016-08-10 NOTE — Progress Notes (Signed)
Pt discharged to home.  Discharge instructions explained to pt.  Pt has no questions at the time of discharge.  Pt states she has all belongings.  IV removed.  Pt ambulated off unit on her own.   

## 2016-08-10 NOTE — Progress Notes (Signed)
Patient ID: Chelsea Cannon, female   DOB: 1966/05/19, 51 y.o.   MRN: [id number]    Progress Note   Subjective   Able to tolerate full liquids- plans to go home today -still some discomfort but controlled with vicodin   Objective   Vital signs in last 24 hours: Temp:  [98 F (36.7 C)-99.5 F (37.5 C)] 98.1 F (36.7 C) (02/18 0505) Pulse Rate:  [65-83] 65 (02/18 0505) Resp:  [14-17] 17 (02/18 0505) BP: (112-121)/(59-63) 112/63 (02/18 0505) SpO2:  [99 %-100 %] 99 % (02/18 0505) Weight:  [160 lb 15 oz (73 kg)] 160 lb 15 oz (73 kg) (02/18 0400) Last BM Date: 08/09/16 General:    white female in NAD, less jaundiced Heart:  Regular rate and rhythm; no murmurs Lungs: Respirations even and unlabored, lungs CTA bilaterally Abdomen:  Soft, minimally tender and nondistended. Normal bowel sounds. Extremities:  Without edema. Neurologic:  Alert and oriented,  grossly normal neurologically. Psych:  Cooperative. Normal mood and affect.  Intake/Output from previous day: 02/17 0701 - 02/18 0700 In: 3648 [P.O.:1535; I.V.:2113] Out: 4650 [Urine:4650] Intake/Output this shift: Total I/O In: -  Out: 600 [Urine:600]  Lab Results:  Recent Labs  08/07/16 1135 08/08/16 0622 08/09/16 0754  WBC 7.8 7.8 9.9  HGB 11.8* 10.4* 9.8*  HCT 35.6* 32.0* 30.3*  PLT 478* 408* 396   BMET  Recent Labs  08/08/16 0622 08/09/16 0754 08/10/16 0408  NA 138 139 139  K 3.7 3.5 3.9  CL 108 109 108  CO2 20* 25 21*  GLUCOSE 96 140* 89  BUN 10 9 6   CREATININE 0.76 0.79 0.71  CALCIUM 9.3 8.7* 8.8*   LFT  Recent Labs  08/07/16 1135 08/07/16 1629  08/10/16 0408  PROT 8.1  --   < > 5.9*  ALBUMIN 3.9  --   < > 2.9*  AST 895*  --   < > 507*  ALT 1,171*  --   < > 781*  ALKPHOS 587*  --   < > 381*  BILITOT 9.1*  --   < > 3.5*  BILIDIR 5.5* 5.4*  --   --   IBILI 3.6*  --   --   --   < > = values in this interval not displayed. PT/INR  Recent Labs  08/07/16 1135 08/08/16 0622  LABPROT  13.6 14.5  INR 1.03 1.13    Studies/Results: Dg Ercp With Sphincterotomy  Result Date: 08/08/2016 CLINICAL DATA:  Choledocholithiasis. EXAM: ERCP TECHNIQUE: Multiple spot images obtained with the fluoroscopic device and submitted for interpretation post-procedure. COMPARISON:  MRI/ MRCP exam on 08/07/2016 FINDINGS: C-arm images during ERCP demonstrate cannulation and injection of the common bile duct demonstrating diffuse dilatation of the duct and at least 1 filling defect consistent with choledocholithiasis. Balloon sweep maneuver was performed. A biliary stent was placed in the common bile duct. IMPRESSION: ERCP confirms the presence of choledocholithiasis. Balloon sweep maneuver was performed and a biliary stent placed. These images were submitted for radiologic interpretation only. Please see the procedural report for the amount of contrast and the fluoroscopy time utilized. Electronically Signed   By: Aletta Edouard M.D.   On: 08/08/2016 15:35       Assessment / Plan:     #1 51 yo female with jaundice secondary to choledocholithiasis- stable s/p ERCP/sphincterotomy ,stone extraction and placement of CBD stent and pancreatic duct stent 2/16, one retained large stone 34mm She is improved , Bili down to 3.5 Able to tolerate  po's  Plan; Home today  Dr Loletha Carrow will arrange referral to tertiary care center for repeat ERCP and stone extraction/stent removal - our office will call her this week  Active Problems:   Common bile duct dilatation   Fatty liver disease, nonalcoholic   Elevated LFTs   Jaundice   Asthma   Anemia   Menorrhagia   Choledocholithiasis     LOS: 3 days   Chelsea Cannon  08/10/2016, 9:38 AM

## 2016-08-11 ENCOUNTER — Telehealth: Payer: Self-pay

## 2016-08-11 ENCOUNTER — Encounter (HOSPITAL_COMMUNITY): Payer: Self-pay | Admitting: Gastroenterology

## 2016-08-11 NOTE — Telephone Encounter (Signed)
Follow up visit made for first available on 08/21/16. Left voice mail message for patient.

## 2016-08-11 NOTE — Telephone Encounter (Signed)
-----   Message from Barron Alvine, RN sent at 08/11/2016  8:24 AM EST -----   ----- Message ----- From: Mauri Pole, MD Sent: 08/10/2016  10:07 AM To: Barron Alvine, RN, Nelida Meuse III, MD  She needs follow up visit with Dr Loletha Carrow Thanks VN

## 2016-08-14 ENCOUNTER — Telehealth: Payer: Self-pay | Admitting: Gastroenterology

## 2016-08-14 ENCOUNTER — Other Ambulatory Visit: Payer: Self-pay

## 2016-08-14 ENCOUNTER — Telehealth: Payer: Self-pay

## 2016-08-14 DIAGNOSIS — K831 Obstruction of bile duct: Secondary | ICD-10-CM

## 2016-08-14 NOTE — Telephone Encounter (Signed)
Spoke to patient, she is advised of date/time for ERCP. Will send prep instructions to her. Patient asked about whether or not to keep her follow up appointment for 08/21/16 in our office, please advise.  Do you have any additional orders that you need for the procedure besides normal orders?

## 2016-08-14 NOTE — Telephone Encounter (Signed)
I was unable to reach Luke today, so I left her a brief voicemail.  Told her we need to plan a repeat ERCP to attempt a stone removal.  Please set it up for Thurs, Mar 8th, as that is the only possible day I would be able to do it in the next month and it cannot wait beyond that.  The stents need to be removed on that date, and I would use special equipment ( please schedule with [facility] endo for Spyglass lithotripsy) to break up the stone for removal.  Please reach her by phone when you are able and confirm that she will come that day.

## 2016-08-14 NOTE — Telephone Encounter (Signed)
This had already been set up yesterday for [facility] on 3/8 at 12:30. I will try reaching her.

## 2016-08-15 ENCOUNTER — Other Ambulatory Visit: Payer: Self-pay

## 2016-08-15 ENCOUNTER — Telehealth: Payer: Self-pay

## 2016-08-15 DIAGNOSIS — K831 Obstruction of bile duct: Secondary | ICD-10-CM

## 2016-08-15 NOTE — Telephone Encounter (Signed)
If she feels comfortable with doing the ERCp on 3/8, then she does not need to come to the office on 3/1.  However, I would like her to get a hepatic function panel drawn sometime next week.

## 2016-08-15 NOTE — Telephone Encounter (Signed)
Spoke to patient and she feels comfortable just proceeding with the ERCP on 3/8. Will cancel the office appointment for 3/1. She will come get some lab work next week, understands to call if she has questions or concerns.

## 2016-08-15 NOTE — Telephone Encounter (Signed)
Patient had called wanting to know what kind of foods she can eat. Looking at her discharge instructions she is to be on full liquid to soft, low fat foods. Discussed options on this, told her to stay away from spicy foods or ones with high fat as this could cause more irritation.

## 2016-08-18 ENCOUNTER — Other Ambulatory Visit (INDEPENDENT_AMBULATORY_CARE_PROVIDER_SITE_OTHER): Payer: Medicaid Other

## 2016-08-18 DIAGNOSIS — K831 Obstruction of bile duct: Secondary | ICD-10-CM | POA: Diagnosis not present

## 2016-08-18 LAB — HEPATIC FUNCTION PANEL
ALBUMIN: 4.2 g/dL (ref 3.5–5.2)
ALK PHOS: 280 U/L — AB (ref 39–117)
ALT: 381 U/L — ABNORMAL HIGH (ref 0–35)
AST: 150 U/L — ABNORMAL HIGH (ref 0–37)
Bilirubin, Direct: 0.8 mg/dL — ABNORMAL HIGH (ref 0.0–0.3)
Total Bilirubin: 1.6 mg/dL — ABNORMAL HIGH (ref 0.2–1.2)
Total Protein: 7.6 g/dL (ref 6.0–8.3)

## 2016-08-21 ENCOUNTER — Ambulatory Visit: Payer: Medicaid Other | Admitting: Gastroenterology

## 2016-08-26 ENCOUNTER — Telehealth: Payer: Self-pay

## 2016-08-26 ENCOUNTER — Encounter (HOSPITAL_COMMUNITY): Payer: Self-pay | Admitting: *Deleted

## 2016-08-26 NOTE — Telephone Encounter (Signed)
[facility] endo scheduling called to let us know that there had been an error in scheduling her procedure for 3/8 and it will need to be rescheduled. Patient and Dr. Loletha Carrow aware that it is rescheduled for 3/14 at 12:30.

## 2016-09-03 ENCOUNTER — Ambulatory Visit (HOSPITAL_COMMUNITY): Payer: Medicaid Other | Admitting: Anesthesiology

## 2016-09-03 ENCOUNTER — Encounter (HOSPITAL_COMMUNITY): Payer: Self-pay

## 2016-09-03 ENCOUNTER — Encounter (HOSPITAL_COMMUNITY)
Admission: RE | Disposition: A | Discharge status: Home / Self Care | Payer: Self-pay | Source: Ambulatory Visit | Attending: Internal Medicine

## 2016-09-03 ENCOUNTER — Observation Stay (HOSPITAL_COMMUNITY)
Admission: RE | Admit: 2016-09-03 | Discharge: 2016-09-04 | Disposition: A | Discharge status: Home / Self Care | Payer: Medicaid Other | Source: Ambulatory Visit | Attending: Internal Medicine | Admitting: Internal Medicine

## 2016-09-03 ENCOUNTER — Ambulatory Visit (HOSPITAL_COMMUNITY): Payer: Medicaid Other

## 2016-09-03 ENCOUNTER — Observation Stay (HOSPITAL_COMMUNITY): Payer: Medicaid Other

## 2016-09-03 DIAGNOSIS — F419 Anxiety disorder, unspecified: Secondary | ICD-10-CM | POA: Insufficient documentation

## 2016-09-03 DIAGNOSIS — K831 Obstruction of bile duct: Secondary | ICD-10-CM

## 2016-09-03 DIAGNOSIS — R531 Weakness: Secondary | ICD-10-CM | POA: Diagnosis not present

## 2016-09-03 DIAGNOSIS — Z4659 Encounter for fitting and adjustment of other gastrointestinal appliance and device: Secondary | ICD-10-CM | POA: Diagnosis not present

## 2016-09-03 DIAGNOSIS — K8050 Calculus of bile duct without cholangitis or cholecystitis without obstruction: Principal | ICD-10-CM | POA: Insufficient documentation

## 2016-09-03 DIAGNOSIS — R42 Dizziness and giddiness: Secondary | ICD-10-CM | POA: Diagnosis not present

## 2016-09-03 DIAGNOSIS — D649 Anemia, unspecified: Secondary | ICD-10-CM | POA: Diagnosis not present

## 2016-09-03 DIAGNOSIS — R0600 Dyspnea, unspecified: Secondary | ICD-10-CM

## 2016-09-03 DIAGNOSIS — T884XXA Failed or difficult intubation, initial encounter: Secondary | ICD-10-CM | POA: Insufficient documentation

## 2016-09-03 DIAGNOSIS — X58XXXA Exposure to other specified factors, initial encounter: Secondary | ICD-10-CM | POA: Diagnosis not present

## 2016-09-03 DIAGNOSIS — R061 Stridor: Secondary | ICD-10-CM | POA: Diagnosis not present

## 2016-09-03 HISTORY — DX: Retained cholelithiasis following cholecystectomy: K91.86

## 2016-09-03 HISTORY — DX: Calculus of gallbladder without cholecystitis without obstruction: K80.20

## 2016-09-03 HISTORY — PX: SPYGLASS CHOLANGIOSCOPY: SHX5441

## 2016-09-03 HISTORY — PX: ERCP: SHX5425

## 2016-09-03 LAB — GLUCOSE, CAPILLARY: Glucose-Capillary: 159 mg/dL — ABNORMAL HIGH (ref 65–99)

## 2016-09-03 SURGERY — ERCP, WITH INTERVENTION IF INDICATED
Anesthesia: General

## 2016-09-03 MED ORDER — METHYLPREDNISOLONE SODIUM SUCC 125 MG IJ SOLR
60.0000 mg | Freq: Once | INTRAMUSCULAR | Status: AC
  Administered 2016-09-03: 60 mg via INTRAVENOUS
  Filled 2016-09-03: qty 2

## 2016-09-03 MED ORDER — ACETAMINOPHEN 325 MG PO TABS
650.0000 mg | ORAL_TABLET | Freq: Four times a day (QID) | ORAL | Status: DC | PRN
  Administered 2016-09-03: 650 mg via ORAL
  Filled 2016-09-03: qty 2

## 2016-09-03 MED ORDER — ACETAMINOPHEN 650 MG RE SUPP: 650.0000 mg | Freq: Four times a day (QID) | RECTAL | Status: DC | PRN

## 2016-09-03 MED ORDER — LACTATED RINGERS IV SOLN
INTRAVENOUS | Status: DC | PRN
  Administered 2016-09-03: 12:00:00 via INTRAVENOUS

## 2016-09-03 MED ORDER — LORAZEPAM 2 MG/ML IJ SOLN: 1.0000 mg | Freq: Four times a day (QID) | INTRAMUSCULAR | Status: DC | PRN

## 2016-09-03 MED ORDER — PROPOFOL 10 MG/ML IV BOLUS
INTRAVENOUS | Status: AC
  Filled 2016-09-03: qty 20

## 2016-09-03 MED ORDER — ONDANSETRON HCL 4 MG PO TABS: 4.0000 mg | ORAL_TABLET | Freq: Four times a day (QID) | ORAL | Status: DC | PRN

## 2016-09-03 MED ORDER — DEXAMETHASONE SODIUM PHOSPHATE 10 MG/ML IJ SOLN
INTRAMUSCULAR | Status: AC
  Filled 2016-09-03: qty 1

## 2016-09-03 MED ORDER — ONDANSETRON HCL 4 MG/2ML IJ SOLN
INTRAMUSCULAR | Status: DC | PRN
  Administered 2016-09-03: 4 mg via INTRAVENOUS

## 2016-09-03 MED ORDER — ROCURONIUM BROMIDE 50 MG/5ML IV SOSY
PREFILLED_SYRINGE | INTRAVENOUS | Status: AC
  Filled 2016-09-03: qty 5

## 2016-09-03 MED ORDER — INDOMETHACIN 50 MG RE SUPP
RECTAL | Status: DC | PRN
  Administered 2016-09-03: 100 mg via RECTAL

## 2016-09-03 MED ORDER — CIPROFLOXACIN IN D5W 400 MG/200ML IV SOLN
400.0000 mg | Freq: Once | INTRAVENOUS | Status: AC
  Administered 2016-09-03: 400 mg via INTRAVENOUS

## 2016-09-03 MED ORDER — CIPROFLOXACIN IN D5W 400 MG/200ML IV SOLN
INTRAVENOUS | Status: AC
  Filled 2016-09-03: qty 200

## 2016-09-03 MED ORDER — MIDAZOLAM HCL 2 MG/2ML IJ SOLN
INTRAMUSCULAR | Status: AC
  Filled 2016-09-03: qty 2

## 2016-09-03 MED ORDER — ROCURONIUM BROMIDE 10 MG/ML (PF) SYRINGE
PREFILLED_SYRINGE | INTRAVENOUS | Status: DC | PRN
  Administered 2016-09-03: 10 mg via INTRAVENOUS
  Administered 2016-09-03: 50 mg via INTRAVENOUS

## 2016-09-03 MED ORDER — INDOMETHACIN 50 MG RE SUPP
RECTAL | Status: AC
  Filled 2016-09-03: qty 2

## 2016-09-03 MED ORDER — HYDROMORPHONE HCL 1 MG/ML IJ SOLN
1.0000 mg | INTRAMUSCULAR | Status: DC | PRN
  Administered 2016-09-03: 1 mg via INTRAVENOUS
  Filled 2016-09-03: qty 1

## 2016-09-03 MED ORDER — ONDANSETRON HCL 4 MG/2ML IJ SOLN
INTRAMUSCULAR | Status: AC
  Filled 2016-09-03: qty 2

## 2016-09-03 MED ORDER — RACEPINEPHRINE HCL 2.25 % IN NEBU
0.5000 mL | INHALATION_SOLUTION | Freq: Once | RESPIRATORY_TRACT | Status: AC
  Administered 2016-09-03: 0.5 mL via RESPIRATORY_TRACT
  Filled 2016-09-03: qty 0.5

## 2016-09-03 MED ORDER — FENTANYL CITRATE (PF) 100 MCG/2ML IJ SOLN
INTRAMUSCULAR | Status: DC | PRN
  Administered 2016-09-03: 50 ug via INTRAVENOUS
  Administered 2016-09-03: 100 ug via INTRAVENOUS
  Administered 2016-09-03: 50 ug via INTRAVENOUS

## 2016-09-03 MED ORDER — FENTANYL CITRATE (PF) 100 MCG/2ML IJ SOLN
INTRAMUSCULAR | Status: AC
  Filled 2016-09-03: qty 2

## 2016-09-03 MED ORDER — MIDAZOLAM HCL 5 MG/ML IJ SOLN
INTRAMUSCULAR | Status: AC
  Filled 2016-09-03: qty 1

## 2016-09-03 MED ORDER — LACTATED RINGERS IV SOLN
INTRAVENOUS | Status: DC
  Administered 2016-09-03 – 2016-09-04 (×2): via INTRAVENOUS

## 2016-09-03 MED ORDER — PROPOFOL 10 MG/ML IV BOLUS
INTRAVENOUS | Status: DC | PRN
  Administered 2016-09-03: 120 mg via INTRAVENOUS

## 2016-09-03 MED ORDER — MIDAZOLAM HCL 10 MG/2ML IJ SOLN
INTRAMUSCULAR | Status: DC | PRN
Start: 2016-09-03 — End: 2016-09-03
  Administered 2016-09-03: .5 mg via INTRAVENOUS

## 2016-09-03 MED ORDER — LIDOCAINE 2% (20 MG/ML) 5 ML SYRINGE
INTRAMUSCULAR | Status: AC
  Filled 2016-09-03: qty 5

## 2016-09-03 MED ORDER — MIDAZOLAM HCL 2 MG/2ML IJ SOLN: 0.5000 mg | Freq: Once | INTRAMUSCULAR | Status: DC

## 2016-09-03 MED ORDER — ONDANSETRON HCL 4 MG/2ML IJ SOLN: 4.0000 mg | Freq: Four times a day (QID) | INTRAMUSCULAR | Status: DC | PRN

## 2016-09-03 MED ORDER — LIDOCAINE 2% (20 MG/ML) 5 ML SYRINGE
INTRAMUSCULAR | Status: DC | PRN
  Administered 2016-09-03: 100 mg via INTRAVENOUS

## 2016-09-03 MED ORDER — SODIUM CHLORIDE 0.9 % IV SOLN: INTRAVENOUS | Status: DC

## 2016-09-03 MED ORDER — MIDAZOLAM HCL 2 MG/2ML IJ SOLN
INTRAMUSCULAR | Status: DC | PRN
  Administered 2016-09-03: 2 mg via INTRAVENOUS

## 2016-09-03 MED ORDER — SUGAMMADEX SODIUM 200 MG/2ML IV SOLN
INTRAVENOUS | Status: DC | PRN
  Administered 2016-09-03: 200 mg via INTRAVENOUS

## 2016-09-03 MED ORDER — DEXAMETHASONE SODIUM PHOSPHATE 10 MG/ML IJ SOLN
INTRAMUSCULAR | Status: DC | PRN
  Administered 2016-09-03: 10 mg via INTRAVENOUS

## 2016-09-03 MED ORDER — SODIUM CHLORIDE 0.9 % IV SOLN
INTRAVENOUS | Status: DC | PRN
  Administered 2016-09-03: 14:00:00 via INTRAVENOUS

## 2016-09-03 NOTE — Anesthesia Preprocedure Evaluation (Signed)
Anesthesia Evaluation  Patient identified by MRN, date of birth, ID band Patient awake    Reviewed: Allergy & Precautions, NPO status , Patient's Chart, lab work & pertinent test results  Airway Mallampati: II  TM Distance: >3 FB Neck ROM: Full    Dental no notable dental hx.    Pulmonary neg pulmonary ROS,    Pulmonary exam normal breath sounds clear to auscultation       Cardiovascular negative cardio ROS Normal cardiovascular exam Rhythm:Regular Rate:Normal     Neuro/Psych negative neurological ROS  negative psych ROS   GI/Hepatic negative GI ROS, Neg liver ROS,   Endo/Other  negative endocrine ROS  Renal/GU negative Renal ROS  negative genitourinary   Musculoskeletal negative musculoskeletal ROS (+)   Abdominal   Peds negative pediatric ROS (+)  Hematology negative hematology ROS (+)   Anesthesia Other Findings   Reproductive/Obstetrics negative OB ROS                            Anesthesia Physical Anesthesia Plan  ASA: II  Anesthesia Plan: General   Post-op Pain Management:    Induction: Intravenous  Airway Management Planned: Oral ETT  Additional Equipment:   Intra-op Plan:   Post-operative Plan: Extubation in OR  Informed Consent: I have reviewed the patients History and Physical, chart, labs and discussed the procedure including the risks, benefits and alternatives for the proposed anesthesia with the patient or authorized representative who has indicated his/her understanding and acceptance.   Dental advisory given  Plan Discussed with: CRNA and Surgeon  Anesthesia Plan Comments:         Anesthesia Quick Evaluation

## 2016-09-03 NOTE — Transfer of Care (Signed)
Immediate Anesthesia Transfer of Care Note  Patient: Chelsea Cannon  Procedure(s) Performed: Procedure(s): ENDOSCOPIC RETROGRADE CHOLANGIOPANCREATOGRAPHY (ERCP) (N/A) SPYGLASS CHOLANGIOSCOPY (N/A)  Patient Location: Endoscopy Unit  Anesthesia Type:General  Level of Consciousness: awake  Airway & Oxygen Therapy: Patient Spontanous Breathing and Patient connected to face mask oxygen  Post-op Assessment: Report given to RN and Post -op Vital signs reviewed and stable  Post vital signs: Reviewed and stable  Last Vitals:  Vitals:   09/03/16 1116  BP: 120/64  Pulse: 96  Resp: 14  Temp: 36.8 C    Last Pain:  Vitals:   09/03/16 1116  TempSrc: Oral         Complications: No apparent anesthesia complications

## 2016-09-03 NOTE — Op Note (Signed)
[facility] Patient Name: Chelsea Cannon Procedure Date: 09/03/2016 MRN: [id number] Attending MD: Estill Cotta. Loletha Carrow , MD Date of Birth: 1966-06-01 CSN: [id number] Age: 51 Admit Type: Outpatient Procedure:                ERCP Indications:              For therapy of bile duct stone(s), Biliary stent                            removal, Pancreatic stent removal Providers:                Mallie Mussel L. Loletha Carrow, MD, Hilma Favors, RN, William Dalton, Technician Referring MD:              Medicines:                General Anesthesia, Indomethacin 100 mg PR, Cipro                            956 mg IV Complications:            No immediate complications. Estimated Blood Loss:     Estimated blood loss: none. Procedure:                Pre-Anesthesia Assessment:                           - Prior to the procedure, a History and Physical                            was performed, and patient medications and                            allergies were reviewed. The patient's tolerance of                            previous anesthesia was also reviewed. The risks                            and benefits of the procedure and the sedation                            options and risks were discussed with the patient.                            All questions were answered, and informed consent                            was obtained. Prior Anticoagulants: The patient has                            taken no previous anticoagulant or antiplatelet  agents. ASA Grade Assessment: II - A patient with                            mild systemic disease. After reviewing the risks                            and benefits, the patient was deemed in                            satisfactory condition to undergo the procedure.                           After obtaining informed consent, the scope was                            passed under direct vision. Throughout  the                            procedure, the patient's blood pressure, pulse, and                            oxygen saturations were monitored continuously. The                            Endoscope was introduced through the mouth, and                            used to inject contrast into and used to inject                            contrast into the bile duct. The [id number]                            ([id number]) scope was introduced through the and used                            to inject contrast into. The ERCP was accomplished                            without difficulty. The patient tolerated the                            procedure well. Findings:      A scout film of the abdomen was obtained. Surgical clips, consistent       with a previous cholecystectomy, were seen in the area of the right       upper quadrant of the abdomen. A standard esophagogastroduodenoscopy       scope was used for the examination of the upper gastrointestinal tract.       The scope was passed under direct vision through the upper GI tract. Two       previously placed plastic biliary and pancreatic stents were seen in the       ampulla. Biliary and pancreatic stents were removed through the  esophagogastroduodenoscope. That scope was removed and the duodenoscope       was inserted through the mouth, and the esophagus was intubated under       direct vision. The scope was advanced to a major papilla in the       descending duodenum without detailed examination of the pharynx, larynx       and associated structures, and upper GI tract. The upper GI tract was       grossly normal. The major papilla had evidence of a prior       sphincterotomy. The bile duct was deeply cannulated with the traction       (standard) sphincterotome. Contrast was injected. I personally       interpreted the bile duct images. There was brisk flow of contrast       through the ducts. Image quality was excellent. Contrast  extended to the       hepatic ducts. The main bile duct contained filling defect(s) thought to       be air bubbles. Opacification of the main bile duct was successful. The       maximum diameter of the CBD duct was 10 mm (decreased from prior exam).       0.035 inch x 260 cm straight Hydra Jagwire was passed into the biliary       tree. To discover objects, the biliary tree was swept three times with a       12 mm balloon starting at the bifurcation. The balloon pulled through       easily and nothing was found. The bile duct was explored endoscopically       using the SpyGlass direct visualization probe. The SpyGlass probe was       advanced to the right intrahepatic duct(s). Visibility with the probe       was excellent. The right main intrahepatic duct and the entire CHD and       CBD were normal. Specifically, no stones were seen. The cholangioscope       and wire were removed, and the scope was removed. The total fluoroscopy       exposure time was 3 minutes and 29 seconds. No films were sent to the       radiology department for interpretation. Impression:               - Plastic biliary and pancreatic stents in the                            duodenum.                           - Foreign body (stents) removed.                           - Bubbles were found in the biliary tract.                           - The biliary tree was swept and nothing was found.                           Cholangioscopy confirmed ductal clearance. Moderate Sedation:      GETA Recommendation:           - Resume regular  diet.                           - Return to GI clinic as needed. Procedure Code(s):        --- Professional ---                           [id number], Endoscopic retrograde                            cholangiopancreatography (ERCP); diagnostic,                            including collection of specimen(s) by brushing or                            washing, when performed (separate procedure)                            43247, Esophagogastroduodenoscopy, flexible,                            transoral; with removal of foreign body(s)                           43273, Endoscopic cannulation of papilla with                            direct visualization of pancreatic/common bile                            duct(s) (List separately in addition to code(s) for                            primary procedure)                           50932, Endoscopic catheterization of the biliary                            ductal system, radiological supervision and                            interpretation Diagnosis Code(s):        --- Professional ---                           K80.50, Calculus of bile duct without cholangitis                            or cholecystitis without obstruction                           Z46.59, Encounter for fitting and adjustment of                            other gastrointestinal appliance and device  R93.2, Abnormal findings on diagnostic imaging of                            liver and biliary tract CPT copyright 2016 American [employer] Association. All rights reserved. The codes documented in this report are preliminary and upon coder review may  be revised to meet current compliance requirements. Henry L. Loletha Carrow, MD 09/03/2016 2:18:14 PM This report has been signed electronically. Number of Addenda: 0

## 2016-09-03 NOTE — H&P (Signed)
History:  This patient presents for endoscopic testing for common bile duct stone.  [place] Referring physician: Lindie Spruce, MD  Past Medical History: Past Medical History:  Diagnosis Date  . Anemia   . Jaundice   . Menorrhagia      Past Surgical History: Past Surgical History:  Procedure Laterality Date  . CHOLECYSTECTOMY N/A 03/22/2016   Procedure: LAPAROSCOPIC CHOLECYSTECTOMY WITH INTRAOPERATIVE CHOLANGIOGRAM;  Surgeon: Georganna Skeans, MD;  Location: [place];  Service: General;  Laterality: N/A;  . ERCP N/A 08/08/2016   Procedure: ENDOSCOPIC RETROGRADE CHOLANGIOPANCREATOGRAPHY (ERCP);  Surgeon: Doran Stabler, MD;  Location: [facility] ENDOSCOPY;  Service: Endoscopy;  Laterality: N/A;    Allergies: No Known Allergies  Outpatient Meds: Current Facility-Administered Medications  Medication Dose Route Frequency Provider Last Rate Last Dose  . ciprofloxacin (CIPRO) IVPB 400 mg  400 mg Intravenous Once Doran Stabler, MD       Facility-Administered Medications Ordered in Other Encounters  Medication Dose Route Frequency Provider Last Rate Last Dose  . lactated ringers infusion    Continuous PRN Sharlette Dense, CRNA          ___________________________________________________________________ Objective   Exam:  BP 120/64   Pulse 96   Temp 98.3 F (36.8 C) (Oral)   Resp 14   Ht 5\' 6"  (1.676 m)   Wt 150 lb (68 kg)   LMP 08/06/2016   SpO2 100%   BMI 24.21 kg/m    CV: RRR without murmur, S1/S2, no JVD, no peripheral edema  Resp: clear to auscultation bilaterally, normal RR and effort noted  GI: soft, no tenderness, with active bowel sounds. No guarding or palpable organomegaly noted.  Neuro: awake, alert and oriented x 3. Normal gross motor function and fluent speech   Assessment:  CBD stone  Plan:  ERCP with stent removal, stone extraction, possible Spyglass cholangioscopy with lithotripsy, possible repeat stent placement. All reviewed with  the patient in pre-op, she is agreeable.   Nelida Meuse III

## 2016-09-03 NOTE — Progress Notes (Signed)
Per Dr. [name] the patient will be admitted for 24 hour observation. Report called to Manuela Schwartz, RN in room 1527. Pt transferred at 1800. Jobe Igo, RN

## 2016-09-03 NOTE — H&P (Addendum)
[facility] History and Physical   Primary Care Physician:  Lindie Spruce, MD   Assessment:  Weakness, stridor sounds after general anesthesia for ERCP  Plan:    obs admit, CXR, IVF, prn lorazepam (seems anxious) pulse ox, neuro checks -   Will also give another dose of steroids  HPI: Chelsea Cannon is a 51 y.o. female had an ERCP today - had some difficulty with intubation and got prophylactic decadron."Difficult Airway- due to anterior larynx and Difficult Airway- due to limited oral opening "In recovery had musical sounds from upper airway ? Stridor but VSS and pulse ox and lungs cta  She feels weak and cannot maintain upright in bed w/o assistance. Not dizzy, no visual changes. Admits to feeling anxious. No prior similar episodes.  Has been given racemic EPI and some versed and maybe a bit better.   Pulse ox Nl on RA  Mild abd discomfort no pain  Has had some water w/o difficulty  Past Medical History:  Diagnosis Date  . Anemia   . Gallstones   . Jaundice   . Menorrhagia   . Retained gallstones following laparoscopic cholecystectomy     Past Surgical History:  Procedure Laterality Date  . CHOLECYSTECTOMY N/A 03/22/2016   Procedure: LAPAROSCOPIC CHOLECYSTECTOMY WITH INTRAOPERATIVE CHOLANGIOGRAM;  Surgeon: Georganna Skeans, MD;  Location: [place];  Service: General;  Laterality: N/A;  . ERCP N/A 08/08/2016   Procedure: ENDOSCOPIC RETROGRADE CHOLANGIOPANCREATOGRAPHY (ERCP);  Surgeon: Doran Stabler, MD;  Location: [facility] ENDOSCOPY;  Service: Endoscopy;  Laterality: N/A;    Prior to Admission medications   Medication Sig Start Date End Date Taking? Authorizing Provider  Famotidine (PEPCID PO) Take 2 tablets by mouth daily as needed (heartburn).   Yes Historical Provider, MD  traMADol (ULTRAM) 50 MG tablet Take 1 tablet (50 mg total) by mouth every 6 (six) hours as needed for severe pain. 08/10/16  Yes Ripudeep Krystal Eaton, MD    Current Facility-Administered  Medications  Medication Dose Route Frequency Provider Last Rate Last Dose  . acetaminophen (TYLENOL) tablet 650 mg  650 mg Oral Q6H PRN Gatha Mayer, MD       Or  . acetaminophen (TYLENOL) suppository 650 mg  650 mg Rectal Q6H PRN Gatha Mayer, MD      . lactated ringers infusion   Intravenous Continuous Gatha Mayer, MD      . LORazepam (ATIVAN) injection 1 mg  1 mg Intravenous Q6H PRN Gatha Mayer, MD      . midazolam (VERSED) injection 0.5 mg  0.5 mg Intravenous Once Myrtie Soman, MD      . ondansetron [facility]) tablet 4 mg  4 mg Oral Q6H PRN Gatha Mayer, MD       Or  . ondansetron [facility]) injection 4 mg  4 mg Intravenous Q6H PRN Gatha Mayer, MD        Allergies as of 08/13/2016  . (No Known Allergies)    Family History  Problem Relation Age of Onset  . Colon cancer Mother   . Diabetes Father   . Diabetes Sister     Social History   Social History  . Marital status: Married    Spouse name: N/A  . Number of children: N/A  . Years of education: N/A   Occupational History  . Not on file.   Social History Main Topics  . Smoking status: Never Smoker  . Smokeless tobacco: Never Used  . Alcohol use No  .  Drug use: No  . Sexual activity: Not on file   Other Topics Concern  . Not on file   Social History Narrative  . No narrative on file    Review of Systems: negative except as mentioned in the HPI.  Physical Exam: Vital signs in last 24 hours: Temp:  [98.3 F (36.8 C)-98.6 F (37 C)] 98.6 F (37 C) (03/14 1423) Pulse Rate:  [75-96] 77 (03/14 1753) Resp:  [11-32] 15 (03/14 1753) BP: (106-136)/(59-79) 117/74 (03/14 1650) SpO2:  [97 %-100 %] 98 % (03/14 1753) Weight:  [150 lb (68 kg)] 150 lb (68 kg) (03/14 1116)   General:   Alert,  Well-developed, well-nourished, pleasant and cooperative in NAD Musical sounds from upper airway when breathing but no problems or difficulty breathing Eyes anicteric Pharynx is clear Neck is supple  nontender and no  mass, tracheal deviation Lungs:  Clear throughout to auscultation.  Musciacl sounds from trachea Heart:  Regular rate and rhythm; no murmurs, clicks, rubs,  or gallops. Abdomen:  Soft, nontender and nondistended. Normal bowel sounds.   Neuro/Psych:  Alert and cooperative. Mildly anxious  Strength symmetrical and equal in extremities, ? Slightly weak in upper extremities but is able to pull herself up in the bed by grasping the rails  CN 2-12 intact   @Carl  Simonne Maffucci, MD, [name] [phone] (pager) 09/03/2016 6:04 PM@

## 2016-09-03 NOTE — Progress Notes (Signed)
Pt presented with stridor approximately 10 minutes after being in the recovery unit. Marcellus Scott, CRNA and Dr. Kalman Shan immediately notified. Pt placed in sitting position and placed on 2L [place]. Per Dr. Kalman Shan 0.5 mg racemic epi given. Pt continued to verbalize difficulty breathing with stridorous noises. Pt's bilateral breath sounds clear, pt is pink and SPO2 remains 99-100%. Dr. Kalman Shan notified again. Per Dr. Kalman Shan, 0.5 mg versed given and Dr. Kalman Shan to evaluate. Per Dr. Kalman Shan, pt's breath sounds are clear and equal and the patient can be discharged at her discretion. Jobe Igo, RN

## 2016-09-03 NOTE — Discharge Instructions (Signed)
Endoscopic Retrograde Cholangiopancreatogram, Care After °This sheet gives you information about how to care for yourself after your procedure. Your health care provider may also give you more specific instructions. If you have problems or questions, contact your health care provider. °What can I expect after the procedure? °After the procedure, it is common to have: °· Soreness in your throat. °· Nausea. °· Bloating. °· Dizziness. °· Tiredness (fatigue). ° °Follow these instructions at home: °· Take over-the-counter and prescription medicines only as told by your health care provider. °· Do not drive for 24 hours if you were given a medicine to help you relax (sedative) during your procedure. Have someone stay with you for 24 hours after the procedure. °· Return to your normal activities as told by your health care provider. Ask your health care provider what activities are safe for you. °· Return to eating what you normally do as soon as you feel well enough or as told by your health care provider. °· Keep all follow-up visits as told by your health care provider. This is important. °Contact a health care provider if: °· You have pain in your abdomen that does not get better with medicine. °· You develop signs of infection, such as: °? Chills. °? Feeling unwell. °Get help right away if: °· You have difficulty swallowing. °· You have worsening pain in your throat, chest, or abdomen. °· You vomit bright red blood or a substance that looks like coffee grounds. °· You have bloody or very black stools. °· You have a fever. °· You have a sudden increase in swelling (bloating) in your abdomen. °Summary °· After the procedure, it is common to feel tired and to have some discomfort in your throat. °· Contact your health care provider if you have signs of infection--such as chills or feeling unwell--or if you have pain that does not improve with medicine. °· Get help right away if you have trouble swallowing, worsening  pain, bloody or black vomit, bloody or black stools, a fever, or increased swelling in your abdomen. °· Keep all follow-up visits as told by your health care provider. This is important. °This information is not intended to replace advice given to you by your health care provider. Make sure you discuss any questions you have with your health care provider. °Document Released: 03/30/2013 Document Revised: 04/28/2016 Document Reviewed: 04/28/2016 °Elsevier Interactive Patient Education © 2017 [employer] Inc[employer] ° °

## 2016-09-03 NOTE — Interval H&P Note (Signed)
History and Physical Interval Note:  09/03/2016 12:30 PM  Chelsea Cannon  has presented today for surgery, with the diagnosis of CBD stone  The various methods of treatment have been discussed with the patient and family. After consideration of risks, benefits and other options for treatment, the patient has consented to  Procedure(s): ENDOSCOPIC RETROGRADE CHOLANGIOPANCREATOGRAPHY (ERCP) (N/A) SPYGLASS CHOLANGIOSCOPY (N/A) as a surgical intervention .  The patient's history has been reviewed, patient examined, no change in status, stable for surgery.  I have reviewed the patient's chart and labs.  Questions were answered to the patient's satisfaction.     Nelida Meuse III

## 2016-09-03 NOTE — Anesthesia Procedure Notes (Addendum)
Procedure Name: Intubation Date/Time: 09/03/2016 12:52 PM Performed by: Danley Danker L Patient Re-evaluated:Patient Re-evaluated prior to inductionPreoxygenation: Pre-oxygenation with 100% oxygen Intubation Type: IV induction Ventilation: Mask ventilation without difficulty Laryngoscope Size: Mac and 3 Grade View: Grade III Tube size: 7.0 mm Number of attempts: 2 Airway Equipment and Method: Stylet Placement Confirmation: ETT inserted through vocal cords under direct vision,  positive ETCO2 and breath sounds checked- equal and bilateral Secured at: 21 cm Tube secured with: Tape Dental Injury: Teeth and Oropharynx as per pre-operative assessment  Difficulty Due To: Difficult Airway- due to anterior larynx and Difficult Airway- due to limited oral opening

## 2016-09-04 ENCOUNTER — Encounter (HOSPITAL_COMMUNITY): Payer: Self-pay | Admitting: Gastroenterology

## 2016-09-04 ENCOUNTER — Telehealth: Payer: Self-pay

## 2016-09-04 ENCOUNTER — Telehealth: Payer: Self-pay | Admitting: Gastroenterology

## 2016-09-04 DIAGNOSIS — D509 Iron deficiency anemia, unspecified: Secondary | ICD-10-CM

## 2016-09-04 DIAGNOSIS — R061 Stridor: Secondary | ICD-10-CM

## 2016-09-04 DIAGNOSIS — Z4659 Encounter for fitting and adjustment of other gastrointestinal appliance and device: Secondary | ICD-10-CM

## 2016-09-04 DIAGNOSIS — R932 Abnormal findings on diagnostic imaging of liver and biliary tract: Secondary | ICD-10-CM

## 2016-09-04 DIAGNOSIS — R0600 Dyspnea, unspecified: Secondary | ICD-10-CM

## 2016-09-04 DIAGNOSIS — K831 Obstruction of bile duct: Secondary | ICD-10-CM

## 2016-09-04 DIAGNOSIS — K8050 Calculus of bile duct without cholangitis or cholecystitis without obstruction: Secondary | ICD-10-CM

## 2016-09-04 LAB — COMPREHENSIVE METABOLIC PANEL
ALBUMIN: 3.2 g/dL — AB (ref 3.5–5.0)
ALT: 262 U/L — ABNORMAL HIGH (ref 14–54)
AST: 238 U/L — AB (ref 15–41)
Alkaline Phosphatase: 221 U/L — ABNORMAL HIGH (ref 38–126)
Anion gap: 6 (ref 5–15)
BUN: 12 mg/dL (ref 6–20)
CALCIUM: 8.9 mg/dL (ref 8.9–10.3)
CHLORIDE: 112 mmol/L — AB (ref 101–111)
CO2: 22 mmol/L (ref 22–32)
Creatinine, Ser: 0.77 mg/dL (ref 0.44–1.00)
GFR calc Af Amer: >60 mL/min (ref >60)
GFR calc non Af Amer: >60 mL/min (ref >60)
GLUCOSE: 158 mg/dL — AB (ref 65–99)
POTASSIUM: 3.1 mmol/L — AB (ref 3.5–5.1)
Sodium: 140 mmol/L (ref 135–145)
Total Bilirubin: 1.9 mg/dL — ABNORMAL HIGH (ref 0.3–1.2)
Total Protein: 6.6 g/dL (ref 6.5–8.1)

## 2016-09-04 LAB — CBC
HCT: 25.8 % — ABNORMAL LOW (ref 36.0–46.0)
HEMOGLOBIN: 8.6 g/dL — AB (ref 12.0–15.0)
MCH: 26 pg (ref 26.0–34.0)
MCHC: 33.3 g/dL (ref 30.0–36.0)
MCV: 77.9 fL — ABNORMAL LOW (ref 78.0–100.0)
Platelets: 400 10*3/uL (ref 150–400)
RBC: 3.31 MIL/uL — AB (ref 3.87–5.11)
RDW: 13.5 % (ref 11.5–15.5)
WBC: 11.3 10*3/uL — ABNORMAL HIGH (ref 4.0–10.5)

## 2016-09-04 LAB — LIPASE, BLOOD: LIPASE: 12 U/L (ref 11–51)

## 2016-09-04 LAB — AMYLASE: AMYLASE: 33 U/L (ref 28–100)

## 2016-09-04 MED ORDER — HYDROCODONE-ACETAMINOPHEN 5-325 MG PO TABS
1.0000 | ORAL_TABLET | ORAL | Status: DC | PRN
  Administered 2016-09-04: 1 via ORAL
  Filled 2016-09-04: qty 2

## 2016-09-04 MED ORDER — LACTATED RINGERS IV BOLUS (SEPSIS)
250.0000 mL | Freq: Once | INTRAVENOUS | Status: AC
  Administered 2016-09-04: 250 mL via INTRAVENOUS

## 2016-09-04 MED ORDER — LORAZEPAM 1 MG PO TABS
1.0000 mg | ORAL_TABLET | Freq: Four times a day (QID) | ORAL | Status: DC | PRN
  Administered 2016-09-04: 1 mg via ORAL
  Filled 2016-09-04: qty 1

## 2016-09-04 MED ORDER — ONDANSETRON HCL 4 MG PO TABS: 4.0000 mg | ORAL_TABLET | Freq: Four times a day (QID) | ORAL | 0 refills | Status: DC | PRN

## 2016-09-04 MED ORDER — POTASSIUM CHLORIDE CRYS ER 20 MEQ PO TBCR
20.0000 meq | EXTENDED_RELEASE_TABLET | Freq: Three times a day (TID) | ORAL | Status: DC
  Administered 2016-09-04 (×3): 20 meq via ORAL
  Filled 2016-09-04 (×3): qty 1

## 2016-09-04 NOTE — Progress Notes (Signed)
Pt's iv occluded this morning at 8:30.  Called MD for order to leave out due to possible d/c this afternoon and to have Ativan changed from IV to po. Orders were given.

## 2016-09-04 NOTE — Telephone Encounter (Signed)
Her husband called today, she is in severe abdomina pain.  Has been much of the day. He was hoping I would be able to explain why she is hurting.  Reviewed notes in chart.  He does think anxiety may be playing a small role but she is definitely hurting worse, 'severe.'  I recommend he take her to the ER.

## 2016-09-04 NOTE — Progress Notes (Signed)
   Patient Name: Chelsea Cannon Date of Encounter: 09/04/2016, 8:59 AM    Subjective  Still feels like she cannot breathe well O2 sats fine Up to bathroom w/o problems Had RUQ pain overnight relieved w/ Dilaudid 1 mg Admits to feeling anxious but never has been like this Says she thinks we are telling her nothing wrong since tests ok   Objective  BP 90/60 (BP Location: Left Arm)   Pulse 68   Temp 98 F (36.7 C) (Oral)   Resp 20   Ht 5\' 6"  (1.676 m)   Wt 150 lb (68 kg)   LMP 08/06/2016   SpO2 98%   BMI 24.21 kg/m  Anxious voice originally clear, no stridor sounds - as we talk more and gets anxious and tearful the stridor sounds return and gets tearful, tachycardic Lungs CTA Cor S1s2 abd soft, benign and nontender Ext no edema  CXR NL   Assessment and Plan  Stridor sounds and dyspnea Difficult intubation yesterday Anxious RUQ pain - gone S/P ERCP   ? If she has had tracheal injury - mild and that is causing dyspnea and making her anxiou I have reassured her as best I can ? If steroids contributing to anxiety? Will check labs, asked RN to give dose of ativan, reassured, low fat diet - reassess - hopeful dc later  Gatha Mayer, MD, [facility] [phone] (pager) [phone] after 5 PM, weekends and holidays  09/04/2016 8:59 AM

## 2016-09-04 NOTE — Progress Notes (Signed)
Pt was tolerating diet.  No nausea. Her breathing had improved. Had previously complained of epigastric pain for which she was medicated.  D/C instructions were given and understanding was verbalized.

## 2016-09-04 NOTE — Progress Notes (Signed)
Pt complaining of 7/10 right upper abdominal pain radiating to back with no relief from Tylenol.  [name], MD paged. Verbal orders for 1mg  IV Dilaudid Q2h prn severe pain obtained with readback.  Administered to pt with education on Dilaudid's actions and side effects.  Pt agrees to taking medication.  Medication administered. Upon reassessment, pt states that her pain is a 0/10.  Will continue to monitor pt.  Chelsea Cannon

## 2016-09-04 NOTE — Telephone Encounter (Signed)
-----   Message from Levin Erp, [place] sent at 09/04/2016  1:47 PM EDT ----- Regarding: Needs labs Dr. Darnell Level would like labs ordered for this patient in 1 week including CBC, Ferritin, B12 and CMP- to be ordered in Dr. Corena Pilgrim name. She is being discharged from [facility] today. Thanks-JLL

## 2016-09-04 NOTE — Progress Notes (Signed)
Asked by Dr. [name] to evaluate pt who had GA for endo procedure yesterday.  Intubation was difficult and required 2 attempts, but no trauma was noted. After extubation, the pt developed stridor, but normal SaO2, ans stable vital signs.  She reported a feeling of SOB.  Further w/u was negative and the patient did well despite some intermittent feeling of SOB.  Today she is doing well.  No further SOB.  PE: CTAB, no evidence of airway trauma noted.  I feel the pt can be discharged.  I do not know the etiology, but she seems to have improved.

## 2016-09-04 NOTE — Anesthesia Postprocedure Evaluation (Addendum)
Anesthesia Post Note  Patient: Chelsea Cannon  Procedure(s) Performed: Procedure(s) (LRB): ENDOSCOPIC RETROGRADE CHOLANGIOPANCREATOGRAPHY (ERCP) (N/A) SPYGLASS CHOLANGIOSCOPY (N/A)  Anesthesia Type: General Comments: Patient with stridor post op. No respiratory difficulty. Racemic epi given, husband informed that patient may still sound like this for a while. If he notices any difficulty breathing he was told to come to ER       Last Vitals:  Vitals:   09/03/16 2106 09/04/16 0459  BP: 106/63 90/60  Pulse: 79 68  Resp: 18 20  Temp: 37.2 C 36.7 C    Last Pain:  Vitals:   09/04/16 1300  TempSrc:   PainSc: 2                  [name]

## 2016-09-05 NOTE — Discharge Summary (Signed)
[facility] Discharge Summary  Name: Chelsea Cannon MRN: [id number] DOB: 11-01-1965 51 y.o. PCP:  Lindie Spruce, MD  Date of Admission: 09/03/2016 10:51 AM Date of Discharge: 09/05/2016 Primary Gastroenterologist: Loletha Carrow Discharging Physician: [name] Discharge Diagnosis: Active Problems:   Light headedness   RUQ pain   Stridor sounds   Dyspnea   Anemia   Anxiety  Consultations:none  Procedures Performed:  Dg Chest 2 View  Result Date: 09/03/2016 CLINICAL DATA:  ERCP today with difficulty intubation. Shortness of breath and wheezing upon awakening. Nonsmoker. EXAM: CHEST  2 VIEW COMPARISON:  03/20/2016 FINDINGS: Normal heart size and pulmonary vascularity. No focal airspace disease or consolidation in the lungs. No blunting of costophrenic angles. No pneumothorax. Mediastinal contours appear intact. Surgical clips in the right upper quadrant. IMPRESSION: No active cardiopulmonary disease. Electronically Signed   By: Lucienne Capers M.D.   On: 09/03/2016 21:41   Mr 3d Recon At Scanner  Result Date: 08/08/2016 CLINICAL DATA:  Painless jaundice. Status post laparoscopic cholecystectomy 03/22/2016. EXAM: MRI ABDOMEN WITHOUT AND WITH CONTRAST (INCLUDING MRCP) TECHNIQUE: Multiplanar multisequence MR imaging of the abdomen was performed both before and after the administration of intravenous contrast. Heavily T2-weighted images of the biliary and pancreatic ducts were obtained, and three-dimensional MRCP images were rendered by post processing. CONTRAST:  17mL MULTIHANCE GADOBENATE DIMEGLUMINE 529 MG/ML IV SOLN COMPARISON:  03/20/2016 FINDINGS: Lower chest: Normal heart size without pericardial or pleural effusion. Hepatobiliary: 2 right hepatic lobe hemangiomas are again identified. Status post cholecystectomy. Moderate-to-marked intrahepatic biliary duct dilatation. A probable stone in the central aspect of the right hepatic duct. Example 6 mm on image 34/series 5 and image  25/series 3. The common duct is moderately dilated, including at 16 mm on image 17/ series 6. Distal common duct stones measure 6 and 9 mm respectively on series 6. Pancreas:  Normal, without mass or ductal dilatation. Spleen:  Normal in size, without focal abnormality. Adrenals/Urinary Tract: Normal adrenal glands. Normal left kidney. Subcentimeter interpolar right renal cysts. Stomach/Bowel: Normal stomach and abdominal bowel loops. Vascular/Lymphatic: Normal caliber of the aorta and branch vessels. No retroperitoneal or retrocrural adenopathy. Other:  No ascites. Musculoskeletal: No acute osseous abnormality. IMPRESSION: 1. Two distal common duct stones with resultant biliary duct dilatation. 2. Probable central right common hepatic duct stone. This was not described in the preliminary report. 3. Right hepatic lobe hemangiomas. These results will be called to the ordering clinician or representative by the Radiologist Assistant, and communication documented in the PACS or zVision Dashboard. Electronically Signed   By: Abigail Miyamoto M.D.   On: 08/08/2016 07:14   Dg Ercp Biliary & Pancreatic Ducts  Result Date: 09/04/2016 CLINICAL DATA:  Common bile duct stone EXAM: ERCP TECHNIQUE: Multiple spot images obtained with the fluoroscopic device and submitted for interpretation post-procedure. FLUOROSCOPY TIME:  3 minutes 29 seconds 62 mGy COMPARISON:  08/08/2016 and previous FINDINGS: A single fluoroscopic spot image documents endoscopic cannulation and opacification of the CBD with guidewire placement. Cholecystectomy clips. Incomplete opacification of the intrahepatic biliary tree which appears decompressed centrally. IMPRESSION: 1. Endoscopic CBD opacification and intervention. These images were submitted for radiologic interpretation only. Please see the procedural report for the amount of contrast and the fluoroscopy time utilized. Electronically Signed   By: Lucrezia Europe M.D.   On: 09/04/2016 09:26   Dg Ercp  With Sphincterotomy  Result Date: 08/08/2016 CLINICAL DATA:  Choledocholithiasis. EXAM: ERCP TECHNIQUE: Multiple spot images obtained with the fluoroscopic device and submitted for interpretation post-procedure. COMPARISON:  MRI/ MRCP exam on 08/07/2016 FINDINGS: C-arm images during ERCP demonstrate cannulation and injection of the common bile duct demonstrating diffuse dilatation of the duct and at least 1 filling defect consistent with choledocholithiasis. Balloon sweep maneuver was performed. A biliary stent was placed in the common bile duct. IMPRESSION: ERCP confirms the presence of choledocholithiasis. Balloon sweep maneuver was performed and a biliary stent placed. These images were submitted for radiologic interpretation only. Please see the procedural report for the amount of contrast and the fluoroscopy time utilized. Electronically Signed   By: Aletta Edouard M.D.   On: 08/08/2016 15:35   Mr Abdomen Mrcp Moise Boring Contast  Result Date: 08/08/2016 CLINICAL DATA:  Painless jaundice. Status post laparoscopic cholecystectomy 03/22/2016. EXAM: MRI ABDOMEN WITHOUT AND WITH CONTRAST (INCLUDING MRCP) TECHNIQUE: Multiplanar multisequence MR imaging of the abdomen was performed both before and after the administration of intravenous contrast. Heavily T2-weighted images of the biliary and pancreatic ducts were obtained, and three-dimensional MRCP images were rendered by post processing. CONTRAST:  16mL MULTIHANCE GADOBENATE DIMEGLUMINE 529 MG/ML IV SOLN COMPARISON:  03/20/2016 FINDINGS: Lower chest: Normal heart size without pericardial or pleural effusion. Hepatobiliary: 2 right hepatic lobe hemangiomas are again identified. Status post cholecystectomy. Moderate-to-marked intrahepatic biliary duct dilatation. A probable stone in the central aspect of the right hepatic duct. Example 6 mm on image 34/series 5 and image 25/series 3. The common duct is moderately dilated, including at 16 mm on image 17/ series 6.  Distal common duct stones measure 6 and 9 mm respectively on series 6. Pancreas:  Normal, without mass or ductal dilatation. Spleen:  Normal in size, without focal abnormality. Adrenals/Urinary Tract: Normal adrenal glands. Normal left kidney. Subcentimeter interpolar right renal cysts. Stomach/Bowel: Normal stomach and abdominal bowel loops. Vascular/Lymphatic: Normal caliber of the aorta and branch vessels. No retroperitoneal or retrocrural adenopathy. Other:  No ascites. Musculoskeletal: No acute osseous abnormality. IMPRESSION: 1. Two distal common duct stones with resultant biliary duct dilatation. 2. Probable central right common hepatic duct stone. This was not described in the preliminary report. 3. Right hepatic lobe hemangiomas. These results will be called to the ordering clinician or representative by the Radiologist Assistant, and communication documented in the PACS or zVision Dashboard. Electronically Signed   By: Abigail Miyamoto M.D.   On: 08/08/2016 07:14      History/Physical Exam:  See Admission H&P  Admission HPI: See in chart but was placed in observation due to light-headedness and weakness as well as ? Of stridor after GA and ERCP. Had difficult intubation and was Tx w/ Decadron.   Hospital Course by problem list: Active Problems:   Light headedness   RUQ pain   Stridor sounds   Dyspnea   Anemia   Anxiety     She was admitted and observed w/ NL pulse ox on RA throughout, no neuro changes. Stridor sounds improved. Had dyspnea sensation, was anxious and tearful. Did have some RUQ pain transient. Anesthesia f/u no abnormalities. Cause of sxs was not clear but attributed to difficult intubation causing stridor-like sounds and dyspnea - all got better though some persistence of mild sxs - no objective abnormalities related she did have anxiety attacks. LFT bump on labs not unexpected after ERCP. She felt ok to be released. Anemia seen and plans for f/u labs.   Discharge Vitals:  BP  90/60 (BP Location: Left Arm)   Pulse 68   Temp 98 F (36.7 C) (Oral)   Resp 20   Ht 5\' 6"  (  1.676 m)   Wt 150 lb (68 kg)   LMP 08/06/2016   SpO2 98%   BMI 24.21 kg/m   Physical Exam General: NAD Cardiac:S1S2 no rmg Pulmonary:cta no stridor, wheezing Abdominal: soft and nontender Extremity: no edema Neuro: nonfocal Psych: anxious  Discharge Labs:  Lab Results  Component Value Date   ALT 262 (H) 09/04/2016   AST 238 (H) 09/04/2016   ALKPHOS 221 (H) 09/04/2016   BILITOT 1.9 (H) 09/04/2016   Lab Results  Component Value Date   WBC 11.3 (H) 09/04/2016   HGB 8.6 (L) 09/04/2016   HCT 25.8 (L) 09/04/2016   MCV 77.9 (L) 09/04/2016   PLT 400 09/04/2016   Amylase and lipase were NL  Disposition and follow-up:   Ms.Chelsea Cannon was discharged from [facility] in stable condition.    Follow-up Appointments:  Lab f/u in our office in 1 week - CBC, ferritin, B12 and LFT Discharge Medications: Allergies as of 09/04/2016   No Known Allergies     Medication List    TAKE these medications   PEPCID PO Take 2 tablets by mouth daily as needed (heartburn).   traMADol 50 MG tablet Commonly known as:  ULTRAM Take 1 tablet (50 mg total) by mouth every 6 (six) hours as needed for severe pain.       Signed: Silvano Rusk 09/05/2016, 11:34 AM

## 2016-09-10 ENCOUNTER — Other Ambulatory Visit (INDEPENDENT_AMBULATORY_CARE_PROVIDER_SITE_OTHER): Payer: Self-pay

## 2016-09-10 DIAGNOSIS — K831 Obstruction of bile duct: Secondary | ICD-10-CM

## 2016-09-10 LAB — COMPREHENSIVE METABOLIC PANEL
ALBUMIN: 3.7 g/dL (ref 3.5–5.2)
ALT: 558 U/L — ABNORMAL HIGH (ref 0–35)
AST: 166 U/L — AB (ref 0–37)
Alkaline Phosphatase: 499 U/L — ABNORMAL HIGH (ref 39–117)
BUN: 9 mg/dL (ref 6–23)
CALCIUM: 9.4 mg/dL (ref 8.4–10.5)
CHLORIDE: 98 meq/L (ref 96–112)
CO2: 26 meq/L (ref 19–32)
Creatinine, Ser: 0.72 mg/dL (ref 0.40–1.20)
GFR: 90.72 mL/min (ref >60.00)
Glucose, Bld: 143 mg/dL — ABNORMAL HIGH (ref 70–99)
POTASSIUM: 3.3 meq/L — AB (ref 3.5–5.1)
Sodium: 133 mEq/L — ABNORMAL LOW (ref 135–145)
Total Bilirubin: 5.7 mg/dL — ABNORMAL HIGH (ref 0.2–1.2)
Total Protein: 7.4 g/dL (ref 6.0–8.3)

## 2016-09-10 LAB — CBC WITH DIFFERENTIAL/PLATELET
BASOS ABS: 0.1 10*3/uL (ref 0.0–0.1)
Basophils Relative: 1 % (ref 0.0–3.0)
EOS PCT: 0.9 % (ref 0.0–5.0)
Eosinophils Absolute: 0.1 10*3/uL (ref 0.0–0.7)
HCT: 31.1 % — ABNORMAL LOW (ref 36.0–46.0)
Hemoglobin: 10.1 g/dL — ABNORMAL LOW (ref 12.0–15.0)
LYMPHS ABS: 1.3 10*3/uL (ref 0.7–4.0)
Lymphocytes Relative: 10.4 % — ABNORMAL LOW (ref 12.0–46.0)
MCHC: 32.4 g/dL (ref 30.0–36.0)
MCV: 78.5 fl (ref 78.0–100.0)
MONO ABS: 1.9 10*3/uL — AB (ref 0.1–1.0)
MONOS PCT: 15.4 % — AB (ref 3.0–12.0)
NEUTROS ABS: 8.9 10*3/uL — AB (ref 1.4–7.7)
NEUTROS PCT: 72.3 % (ref 43.0–77.0)
PLATELETS: 298 10*3/uL (ref 150.0–400.0)
RBC: 3.96 Mil/uL (ref 3.87–5.11)
RDW: 14.5 % (ref 11.5–15.5)
WBC: 12.3 10*3/uL — AB (ref 4.0–10.5)

## 2016-09-10 LAB — VITAMIN B12: Vitamin B-12: 1403 pg/mL — ABNORMAL HIGH (ref 211–911)

## 2016-09-10 LAB — FERRITIN: FERRITIN: 27.3 ng/mL (ref 10.0–291.0)

## 2016-09-11 ENCOUNTER — Telehealth: Payer: Self-pay | Admitting: Gastroenterology

## 2016-09-11 ENCOUNTER — Inpatient Hospital Stay (HOSPITAL_COMMUNITY)
Admission: EM | Admit: 2016-09-11 | Discharge: 2016-09-13 | DRG: 443 | Disposition: A | Discharge status: Home / Self Care | Payer: Medicaid Other | Attending: Internal Medicine | Admitting: Internal Medicine

## 2016-09-11 ENCOUNTER — Encounter (HOSPITAL_COMMUNITY): Payer: Self-pay | Admitting: Emergency Medicine

## 2016-09-11 DIAGNOSIS — Z833 Family history of diabetes mellitus: Secondary | ICD-10-CM

## 2016-09-11 DIAGNOSIS — R748 Abnormal levels of other serum enzymes: Secondary | ICD-10-CM

## 2016-09-11 DIAGNOSIS — R945 Abnormal results of liver function studies: Secondary | ICD-10-CM

## 2016-09-11 DIAGNOSIS — Z9049 Acquired absence of other specified parts of digestive tract: Secondary | ICD-10-CM | POA: Diagnosis not present

## 2016-09-11 DIAGNOSIS — D509 Iron deficiency anemia, unspecified: Secondary | ICD-10-CM | POA: Diagnosis present

## 2016-09-11 DIAGNOSIS — T368X5A Adverse effect of other systemic antibiotics, initial encounter: Secondary | ICD-10-CM | POA: Diagnosis present

## 2016-09-11 DIAGNOSIS — D649 Anemia, unspecified: Secondary | ICD-10-CM | POA: Diagnosis present

## 2016-09-11 DIAGNOSIS — R509 Fever, unspecified: Secondary | ICD-10-CM

## 2016-09-11 DIAGNOSIS — N920 Excessive and frequent menstruation with regular cycle: Secondary | ICD-10-CM | POA: Diagnosis present

## 2016-09-11 DIAGNOSIS — R739 Hyperglycemia, unspecified: Secondary | ICD-10-CM | POA: Diagnosis present

## 2016-09-11 DIAGNOSIS — E876 Hypokalemia: Secondary | ICD-10-CM | POA: Diagnosis present

## 2016-09-11 DIAGNOSIS — R7989 Other specified abnormal findings of blood chemistry: Secondary | ICD-10-CM

## 2016-09-11 DIAGNOSIS — S36118A Other injury of liver, initial encounter: Principal | ICD-10-CM | POA: Diagnosis present

## 2016-09-11 DIAGNOSIS — K760 Fatty (change of) liver, not elsewhere classified: Secondary | ICD-10-CM | POA: Diagnosis present

## 2016-09-11 DIAGNOSIS — R109 Unspecified abdominal pain: Secondary | ICD-10-CM | POA: Diagnosis present

## 2016-09-11 DIAGNOSIS — R1013 Epigastric pain: Secondary | ICD-10-CM

## 2016-09-11 DIAGNOSIS — R7401 Elevation of levels of liver transaminase levels: Secondary | ICD-10-CM

## 2016-09-11 DIAGNOSIS — K8050 Calculus of bile duct without cholangitis or cholecystitis without obstruction: Secondary | ICD-10-CM

## 2016-09-11 DIAGNOSIS — R740 Nonspecific elevation of levels of transaminase and lactic acid dehydrogenase [LDH]: Secondary | ICD-10-CM | POA: Diagnosis present

## 2016-09-11 LAB — COMPREHENSIVE METABOLIC PANEL
ALBUMIN: 3.4 g/dL — AB (ref 3.5–5.0)
ALT: 431 U/L — AB (ref 14–54)
ANION GAP: 10 (ref 5–15)
AST: 132 U/L — ABNORMAL HIGH (ref 15–41)
Alkaline Phosphatase: 514 U/L — ABNORMAL HIGH (ref 38–126)
BUN: 11 mg/dL (ref 6–20)
CHLORIDE: 103 mmol/L (ref 101–111)
CO2: 24 mmol/L (ref 22–32)
Calcium: 9.1 mg/dL (ref 8.9–10.3)
Creatinine, Ser: 0.96 mg/dL (ref 0.44–1.00)
GFR calc non Af Amer: >60 mL/min (ref >60)
GLUCOSE: 107 mg/dL — AB (ref 65–99)
Potassium: 2.9 mmol/L — ABNORMAL LOW (ref 3.5–5.1)
SODIUM: 137 mmol/L (ref 135–145)
Total Bilirubin: 3 mg/dL — ABNORMAL HIGH (ref 0.3–1.2)
Total Protein: 8 g/dL (ref 6.5–8.1)

## 2016-09-11 LAB — CBC WITH DIFFERENTIAL/PLATELET
BASOS PCT: 0 %
Basophils Absolute: 0 10*3/uL (ref 0.0–0.1)
Eosinophils Absolute: 0.1 10*3/uL (ref 0.0–0.7)
Eosinophils Relative: 1 %
HCT: 28.4 % — ABNORMAL LOW (ref 36.0–46.0)
Hemoglobin: 9.5 g/dL — ABNORMAL LOW (ref 12.0–15.0)
LYMPHS PCT: 15 %
Lymphs Abs: 1.6 10*3/uL (ref 0.7–4.0)
MCH: 25.3 pg — AB (ref 26.0–34.0)
MCHC: 33.5 g/dL (ref 30.0–36.0)
MCV: 75.7 fL — ABNORMAL LOW (ref 78.0–100.0)
METAMYELOCYTES PCT: 1 %
MONOS PCT: 10 %
Monocytes Absolute: 1.1 10*3/uL — ABNORMAL HIGH (ref 0.1–1.0)
NEUTROS PCT: 73 %
Neutro Abs: 8.1 10*3/uL — ABNORMAL HIGH (ref 1.7–7.7)
PLATELETS: 339 10*3/uL (ref 150–400)
RBC: 3.75 MIL/uL — AB (ref 3.87–5.11)
RDW: 14.3 % (ref 11.5–15.5)
WBC: 10.9 10*3/uL — ABNORMAL HIGH (ref 4.0–10.5)

## 2016-09-11 MED ORDER — PIPERACILLIN-TAZOBACTAM 3.375 G IVPB 30 MIN
3.3750 g | Freq: Once | INTRAVENOUS | Status: AC
  Administered 2016-09-11: 3.375 g via INTRAVENOUS
  Filled 2016-09-11: qty 50

## 2016-09-11 MED ORDER — LACTATED RINGERS IV SOLN
INTRAVENOUS | Status: DC
  Administered 2016-09-11 – 2016-09-12 (×2): via INTRAVENOUS

## 2016-09-11 MED ORDER — ONDANSETRON HCL 4 MG PO TABS: 4.0000 mg | ORAL_TABLET | Freq: Four times a day (QID) | ORAL | Status: DC | PRN

## 2016-09-11 MED ORDER — PIPERACILLIN-TAZOBACTAM 3.375 G IVPB
3.3750 g | Freq: Three times a day (TID) | INTRAVENOUS | Status: DC
  Filled 2016-09-11: qty 50

## 2016-09-11 MED ORDER — ONDANSETRON HCL 4 MG/2ML IJ SOLN: 4.0000 mg | Freq: Four times a day (QID) | INTRAMUSCULAR | Status: DC | PRN

## 2016-09-11 MED ORDER — ACETAMINOPHEN 650 MG RE SUPP: 650.0000 mg | Freq: Four times a day (QID) | RECTAL | Status: DC | PRN

## 2016-09-11 MED ORDER — ENOXAPARIN SODIUM 40 MG/0.4ML ~~LOC~~ SOLN: 40.0000 mg | SUBCUTANEOUS | Status: DC

## 2016-09-11 MED ORDER — ACETAMINOPHEN 325 MG PO TABS: 650.0000 mg | ORAL_TABLET | Freq: Four times a day (QID) | ORAL | Status: DC | PRN

## 2016-09-11 NOTE — Progress Notes (Signed)
Pharmacy Antibiotic Follow-up Note  Chelsea Cannon is a 51 y.o. year-old female admitted on 09/11/2016 for increased LFTs, current Cipro as outpt per GI for fevers. The patient is currently on day 1 of Zosyn for intra-abdominal infection.  Cholecystectomy 03/2016, Stent placed 07/2016 for CBD stone, ERCP 09/03/16 -stone resoved, stent removed. Fevers - Cipro per GI 3/18 x 7 days.   Assessment/Plan: Zosyn 3.375g IV q8h (4 hour infusion).  Temp (24hrs), Avg:98.9 F (37.2 C), Min:98.9 F (37.2 C), Max:98.9 F (37.2 C)   Recent Labs Lab 09/10/16 1240 09/11/16 1718  WBC 12.3* 10.9*    Recent Labs Lab 09/10/16 1240  CREATININE 0.72   Estimated Creatinine Clearance: 77.9 mL/min (by C-G formula based on SCr of 0.72 mg/dL).    No Known Allergies  Antimicrobials this admission: 3/22 Zosyn >>   Microbiology results: None ordered at this time  Thank you for allowing pharmacy to be a part of this patient's care.  Minda Ditto PharmD 09/11/2016 5:59 PM

## 2016-09-11 NOTE — ED Notes (Signed)
Called to give report. Secretary said they called in a nurse and she has 45 more minutes to get here.

## 2016-09-11 NOTE — ED Triage Notes (Addendum)
Pt states she goes to [place], had ERCP on 3/14 and was told to come here to be admitted for further testing. Pt denies any pain, just tired.   Pt has note from Alonza Bogus, [place] for [employer] [phone]

## 2016-09-11 NOTE — H&P (Signed)
History and Physical    Chelsea Cannon QPY:[id number] DOB: 08-Aug-1965 DOA: 09/11/2016  PCP: Lindie Spruce, MD Consultants:  Loletha Carrow - GI Patient coming from: home - lives with husband and 3 children (2 adult, 1 juvenile); NOK: husband, [phone]  Chief Complaint: elevated LFTs  HPI: Chelsea Cannon is a 51 y.o. female with medical history significant of retained gallstones s/p cholecystectomy who was sent by GI due to increasing LFTs.  Had gallbladder taken out in 10/17.  Had retained stones in CBD, had stent placed in February via ERCP.  March 14, repeat ERCP showed that the stone was disintegrated and the stent was removed.  Yesterday, she went for bloodwork and today they called and said that her LFTs were doubling and they encouraged her to come to the ER to further assessment.  She has had ongoing fatigue, weakness, mild epigastric TTP. She was actually feeling better finally last night and this AM prior to the call encouraging her to come in.   ED Course:  GI encouraged patient to come in.  They will do ERCP tomorrow to evaluate for infection or retained stone.  Empiric Zosyn for now.  Review of Systems: As per HPI; otherwise 10 point review of systems reviewed and negative.   Ambulatory Status:  Ambulates without assistance  Past Medical History:  Diagnosis Date  . Anemia   . Gallstones   . Jaundice   . Menorrhagia   . Retained gallstones following laparoscopic cholecystectomy     Past Surgical History:  Procedure Laterality Date  . CHOLECYSTECTOMY N/A 03/22/2016   Procedure: LAPAROSCOPIC CHOLECYSTECTOMY WITH INTRAOPERATIVE CHOLANGIOGRAM;  Surgeon: Georganna Skeans, MD;  Location: [place];  Service: General;  Laterality: N/A;  . ERCP N/A 08/08/2016   Procedure: ENDOSCOPIC RETROGRADE CHOLANGIOPANCREATOGRAPHY (ERCP);  Surgeon: Doran Stabler, MD;  Location[facility] ENDOSCOPY;  Service: Endoscopy;  Laterality: N/A;  . ERCP N/A 09/03/2016   Procedure: ENDOSCOPIC RETROGRADE  CHOLANGIOPANCREATOGRAPHY (ERCP);  Surgeon: Doran Stabler, MD;  Location: [facility];  Service: Gastroenterology;  Laterality: N/A;  . SPYGLASS CHOLANGIOSCOPY N/A 09/03/2016   Procedure: JASNKNLZ CHOLANGIOSCOPY;  Surgeon: Doran Stabler, MD;  Location: [facility] ENDOSCOPY;  Service: Gastroenterology;  Laterality: N/A;    Social History   Social History  . Marital status: Married    Spouse name: N/A  . Number of children: N/A  . Years of education: N/A   Occupational History  . unemployed    Social History Main Topics  . Smoking status: Never Smoker  . Smokeless tobacco: Never Used  . Alcohol use No  . Drug use: No  . Sexual activity: Not on file   Other Topics Concern  . Not on file   Social History Narrative  . No narrative on file    No Known Allergies  Family History  Problem Relation Age of Onset  . Colon cancer Mother   . Diabetes Father   . Diabetes Sister     Prior to Admission medications   Medication Sig Start Date End Date Taking? Authorizing Provider  ciprofloxacin (CIPRO) 500 MG tablet Take 500 mg by mouth 2 (two) times daily. 09/07/16  Yes Historical Provider, MD  famotidine (PEPCID AC) 10 MG tablet Take 10 mg by mouth daily as needed for heartburn or indigestion.   Yes Historical Provider, MD  ondansetron (ZOFRAN) 4 MG tablet Take 4 mg by mouth every 6 (six) hours as needed for nausea or vomiting.   Yes Historical Provider, MD  traMADol Veatrice Bourbon)  50 MG tablet Take 1 tablet (50 mg total) by mouth every 6 (six) hours as needed for severe pain. 08/10/16  Yes Ripudeep Krystal Eaton, MD    Physical Exam: Vitals:   09/11/16 1621 09/11/16 1932 09/11/16 2102  BP: (!) 150/86 124/78 123/75  Pulse: (!) 104 76 97  Resp: _0 Temp: 98.9 F (37.2 C) 98.5 F (36.9 C) 98.9 F (37.2 C)  TempSrc: Oral Oral Oral  SpO2: 100% 99% 99%  Weight: 66.8 kg (147 lb 3.2 oz)    Height: _1  (1.676 m)       General:  Appears calm and comfortable and is NAD Eyes:  PERRL,  EOMI, normal lids, iris ENT:  grossly normal hearing, lips & tongue, mmm Neck:  no LAD, masses or thyromegaly Cardiovascular:  RRR, no m/r/g. No LE edema.  Respiratory:  CTA bilaterally, no w/r/r. Normal respiratory effort. Abdomen:  soft, ntnd, NABS Skin:  no rash or induration seen on limited exam Musculoskeletal: grossly normal tone BUE/BLE, good ROM, no bony abnormality Psychiatric:  grossly normal mood and affect, speech fluent and appropriate, AOx3 Neurologic:  CN 2-12 grossly intact, moves all extremities in coordinated fashion, sensation intact  Labs on Admission: I have personally reviewed following labs and imaging studies  CBC:  Recent Labs Lab 09/10/16 1240 09/11/16 1718  WBC 12.3* 10.9*  NEUTROABS 8.9* 8.1*  HGB 10.1* 9.5*  HCT 31.1* 28.4*  MCV 78.5 75.7*  PLT 298.0 662   Basic Metabolic Panel:  Recent Labs Lab 09/10/16 1240 09/11/16 1718  NA 133* 137  K 3.3* 2.9*  CL 98 103  CO2 26 24  GLUCOSE 143* 107*  BUN 9 11  CREATININE 0.72 0.96  CALCIUM 9.4 9.1   GFR: Estimated Creatinine Clearance: 64.9 mL/min (by C-G formula based on SCr of 0.96 mg/dL). Liver Function Tests:  Recent Labs Lab 09/10/16 1240 09/11/16 1718  AST 166* 132*  ALT 558* 431*  ALKPHOS 499* 514*  BILITOT 5.7* 3.0*  PROT 7.4 8.0  ALBUMIN 3.7 3.4*   No results for input(s): LIPASE, AMYLASE in the last 168 hours. No results for input(s): AMMONIA in the last 168 hours. Coagulation Profile: No results for input(s): INR, PROTIME in the last 168 hours. Cardiac Enzymes: No results for input(s): CKTOTAL, CKMB, CKMBINDEX, TROPONINI in the last 168 hours. BNP (last 3 results) No results for input(s): PROBNP in the last 8760 hours. HbA1C: No results for input(s): HGBA1C in the last 72 hours. CBG: No results for input(s): GLUCAP in the last 168 hours. Lipid Profile: No results for input(s): CHOL, HDL, LDLCALC, TRIG, CHOLHDL, LDLDIRECT in the last 72 hours. Thyroid Function  Tests: No results for input(s): TSH, T4TOTAL, FREET4, T3FREE, THYROIDAB in the last 72 hours. Anemia Panel:  Recent Labs  09/10/16 1240  VITAMINB12 1,403*  FERRITIN 27.3   Urine analysis: No results found for: COLORURINE, APPEARANCEUR, LABSPEC, PHURINE, GLUCOSEU, HGBUR, BILIRUBINUR, KETONESUR, PROTEINUR, UROBILINOGEN, NITRITE, LEUKOCYTESUR  Creatinine Clearance: Estimated Creatinine Clearance: 64.9 mL/min (by C-G formula based on SCr of 0.96 mg/dL).  Sepsis Labs: _2 (procalcitonin:4,lacticidven:4) )No results found for this or any previous visit (from the past 240 hour(s)).   Radiological Exams on Admission: No results found.  EKG: not done  Assessment/Plan Principal Problem:   Abdominal pain Active Problems:   Elevated LFTs   Anemia   Hyperglycemia   Hypokalemia   Abdominal pain -Patient with h/o cholecystectomy for gallstones who previously had a retained stone is now presenting with an apparent obstructive  jaundice picture concerning for another retained stone -Alk phos 514, prior 499 3/21, 221 on 3/15 -AST/ALT 132/431, prior 166/558, 238/262 -Bilirubin 3.0, prior 5.7, 1.9 -WBC 10.9, improved -Will admit to med surg -NPO after midnight for ERCP -ERCP tomorrow to determine infection vs. Retained stone -Continue Zosyn -Morphine as needed for pain  Hypokalemia -K 2.9 -Replete with 80 mEq KCl in IVF -Recheck K+ in AM  Hyperglycemia -Glucose 107 (prior 143, 157) -Concerning for new-onset DM -Will check A1c -Consider SSI pending results of A1c  Anemia -Hgb 9.5, stable  DVT prophylaxis: SCDs Code Status: Full - confirmed with patient/family Family Communication: Son present during evaluation  Disposition Plan:  Home once clinically improved Consults called: GI  Admission status: Admit - It is my clinical opinion that admission to INPATIENT is reasonable and necessary because this patient will require at least 2 midnights in the hospital to treat  this condition based on the medical complexity of the problems presented.  Given the aforementioned information, the predictability of an adverse outcome is felt to be significant.     Karmen Bongo MD [facility]  If 7PM-7AM, please contact night-coverage [email or web address] Password [email or web address]  09/12/2016, 1:56 AM

## 2016-09-11 NOTE — ED Provider Notes (Signed)
[facility] DEPT Provider Note   CSN: [id number] Arrival date & time: 09/11/16  1612     History   Chief Complaint Chief Complaint  Patient presents with  . Jaundice    HPI Chelsea Cannon is a 51 y.o. female.  HPI  Patient was sent in by [facility] for admission. She had retained gallstones after previous cholecystectomy. In February she had stents placed because the stones could not be removed. On the 14th of this month she had the stents removed and the stone was reportedly gone. She has continued to have fatigue. She had fevers a few days ago. States she started on Cipro by GI and the fevers have resolved. Was seen in the office yesterday and had lab work done which showed increasing LFTs. No further fevers. No abdominal pain. No change in stools. Patient states she's been real fatigued and short of breath but otherwise no different.  Past Medical History:  Diagnosis Date  . Anemia   . Gallstones   . Jaundice   . Menorrhagia   . Retained gallstones following laparoscopic cholecystectomy     Patient Active Problem List   Diagnosis Date Noted  . Abdominal pain 09/11/2016  . Light headedness 09/03/2016  . Common bile duct stone   . Elevated LFTs 08/07/2016  . Jaundice 08/07/2016  . Asthma 08/07/2016  . Anemia 08/07/2016  . Menorrhagia 08/07/2016  . Cholecystitis   . Intractable vomiting with nausea   . Cholelithiasis 03/21/2016  . Cholelithiasis without cholecystitis 03/20/2016  . Acute epigastric pain 03/20/2016  . Common bile duct dilatation 03/20/2016  . Fatty liver disease, nonalcoholic 25/95/6387  . Leukocytosis 03/20/2016    Past Surgical History:  Procedure Laterality Date  . CHOLECYSTECTOMY N/A 03/22/2016   Procedure: LAPAROSCOPIC CHOLECYSTECTOMY WITH INTRAOPERATIVE CHOLANGIOGRAM;  Surgeon: Georganna Skeans, MD;  Location: [facility];  Service: General;  Laterality: N/A;  . ERCP N/A 08/08/2016   Procedure: ENDOSCOPIC RETROGRADE CHOLANGIOPANCREATOGRAPHY  (ERCP);  Surgeon: Doran Stabler, MD;  Location: [facility] ENDOSCOPY;  Service: Endoscopy;  Laterality: N/A;  . ERCP N/A 09/03/2016   Procedure: ENDOSCOPIC RETROGRADE CHOLANGIOPANCREATOGRAPHY (ERCP);  Surgeon: Doran Stabler, MD;  Location: [facility]OS[facility];  Service: Gastroenterology;  Laterality: N/A;  . SPYGLASS CHOLANGIOSCOPY N/A 09/03/2016   Procedure: FIEPPIRJ CHOLANGIOSCOPY;  Surgeon: Doran Stabler, MD;  Location: [facility] ENDOSCOPY;  Service: Gastroenterology;  Laterality: N/A;    OB History    No data available       Home Medications    Prior to Admission medications   Medication Sig Start Date End Date Taking? Authorizing Provider  ciprofloxacin (CIPRO) 500 MG tablet Take 500 mg by mouth 2 (two) times daily. 09/07/16  Yes Historical Provider, MD  famotidine (PEPCID AC) 10 MG tablet Take 10 mg by mouth daily as needed for heartburn or indigestion.   Yes Historical Provider, MD  ondansetron (ZOFRAN) 4 MG tablet Take 4 mg by mouth every 6 (six) hours as needed for nausea or vomiting.   Yes Historical Provider, MD  traMADol (ULTRAM) 50 MG tablet Take 1 tablet (50 mg total) by mouth every 6 (six) hours as needed for severe pain. 08/10/16  Yes Ripudeep Krystal Eaton, MD    Family History Family History  Problem Relation Age of Onset  . Colon cancer Mother   . Diabetes Father   . Diabetes Sister     Social History Social History  Substance Use Topics  . Smoking status: Never Smoker  . Smokeless tobacco: Never  Used  . Alcohol use No     Allergies   Patient has no known allergies.   Review of Systems Review of Systems  Constitutional: Positive for fatigue. Negative for appetite change.  Respiratory: Negative for shortness of breath.   Cardiovascular: Negative for chest pain.  Gastrointestinal: Negative for abdominal pain.  Genitourinary: Negative for difficulty urinating.  Musculoskeletal: Negative for back pain.  Skin: Negative for color change.  Neurological: Negative for  light-headedness.  Hematological: Negative for adenopathy.  Psychiatric/Behavioral: Negative for confusion.     Physical Exam Updated Vital Signs BP (!) 150/86 (BP Location: Left Arm)   Pulse (!) 104   Temp 98.9 F (37.2 C) (Oral)   Resp 16   Ht 5\' 6"  (1.676 m)   Wt 147 lb 3.2 oz (66.8 kg)   SpO2 100%   BMI 23.76 kg/m   Physical Exam  Constitutional: She appears well-developed.  HENT:  Head: Atraumatic.  Eyes: Pupils are equal, round, and reactive to light.  Neck: Neck supple.  Cardiovascular: Normal rate.   Pulmonary/Chest: Effort normal.  Abdominal: There is tenderness.  Mild right upper quadrant tenderness without rebound or guarding.  Musculoskeletal: She exhibits no edema.  Neurological: She is alert.  Skin: Skin is warm.     ED Treatments / Results  Labs (all labs ordered are listed, but only abnormal results are displayed) Labs Reviewed  CBC WITH DIFFERENTIAL/PLATELET - Abnormal; Notable for the following:       Result Value   WBC 10.9 (*)    RBC 3.75 (*)    Hemoglobin 9.5 (*)    HCT 28.4 (*)    MCV 75.7 (*)    MCH 25.3 (*)    Neutro Abs 8.1 (*)    Monocytes Absolute 1.1 (*)    All other components within normal limits  COMPREHENSIVE METABOLIC PANEL - Abnormal; Notable for the following:    Potassium 2.9 (*)    Glucose, Bld 107 (*)    Albumin 3.4 (*)    AST 132 (*)    ALT 431 (*)    Alkaline Phosphatase 514 (*)    Total Bilirubin 3.0 (*)    All other components within normal limits    EKG  EKG Interpretation None       Radiology No results found.  Procedures Procedures (including critical care time)  Medications Ordered in ED Medications  piperacillin-tazobactam (ZOSYN) IVPB 3.375 g (3.375 g Intravenous New Bag/Given 09/11/16 1904)  piperacillin-tazobactam (ZOSYN) IVPB 3.375 g (not administered)     Initial Impression / Assessment and Plan / ED Course  I have reviewed the triage vital signs and the nursing notes.  Pertinent  labs & imaging results that were available during my care of the patient were reviewed by me and considered in my medical decision making (see chart for details).     Patient with elevating LFTs. Had had a fever at home this weekend. May or may not have been on antibiotics. Discussed with gastroenterology, who sent her in. Will get ERCP tomorrow to evaluate for infection or retained stone. Will empirically start Zosyn. Will admit to internal medicine.  Final Clinical Impressions(s) / ED Diagnoses   Final diagnoses:  Transaminitis    New Prescriptions New Prescriptions   No medications on file     Davonna Belling, MD 09/12/16 [id number]

## 2016-09-11 NOTE — Telephone Encounter (Signed)
I conveyed the results of Chelsea Cannon's labs results to her.  Told her I am concerned she may somehow have another bile duct stone, since her liver labs looks like obstruction.  Her urine has turned dark and feeling quite fatigued.  I instructed her to go to the [facility] ED for evaluation, consult by our group, and admission to the medical service for further workup.  She will go this afternoon.  I will call the ED to let them know why she is coming, and I will call Dr. Henrene Pastor , who is covering the hospital for [place] this week.

## 2016-09-12 ENCOUNTER — Inpatient Hospital Stay (HOSPITAL_COMMUNITY): Payer: Medicaid Other

## 2016-09-12 ENCOUNTER — Inpatient Hospital Stay (HOSPITAL_COMMUNITY): Payer: Medicaid Other | Admitting: Anesthesiology

## 2016-09-12 ENCOUNTER — Encounter (HOSPITAL_COMMUNITY)
Admission: EM | Disposition: A | Discharge status: Home / Self Care | Payer: Self-pay | Source: Home / Self Care | Attending: Internal Medicine

## 2016-09-12 ENCOUNTER — Encounter (HOSPITAL_COMMUNITY): Payer: Self-pay | Admitting: Anesthesiology

## 2016-09-12 ENCOUNTER — Inpatient Hospital Stay: Admit: 2016-09-12 | Payer: Self-pay | Admitting: Internal Medicine

## 2016-09-12 DIAGNOSIS — R17 Unspecified jaundice: Secondary | ICD-10-CM

## 2016-09-12 DIAGNOSIS — R739 Hyperglycemia, unspecified: Secondary | ICD-10-CM | POA: Diagnosis present

## 2016-09-12 DIAGNOSIS — E876 Hypokalemia: Secondary | ICD-10-CM | POA: Diagnosis present

## 2016-09-12 HISTORY — PX: ERCP: SHX5425

## 2016-09-12 LAB — CBC
HCT: 29.1 % — ABNORMAL LOW (ref 36.0–46.0)
Hemoglobin: 9.6 g/dL — ABNORMAL LOW (ref 12.0–15.0)
MCH: 25.6 pg — AB (ref 26.0–34.0)
MCHC: 33 g/dL (ref 30.0–36.0)
MCV: 77.6 fL — AB (ref 78.0–100.0)
PLATELETS: 375 10*3/uL (ref 150–400)
RBC: 3.75 MIL/uL — ABNORMAL LOW (ref 3.87–5.11)
RDW: 14.6 % (ref 11.5–15.5)
WBC: 13.3 10*3/uL — ABNORMAL HIGH (ref 4.0–10.5)

## 2016-09-12 LAB — COMPREHENSIVE METABOLIC PANEL
ALT: 406 U/L — AB (ref 14–54)
ALT: 352 U/L — ABNORMAL HIGH (ref 14–54)
AST: 184 U/L — ABNORMAL HIGH (ref 15–41)
AST: 181 U/L — AB (ref 15–41)
Albumin: 3.1 g/dL — ABNORMAL LOW (ref 3.5–5.0)
Albumin: 3 g/dL — ABNORMAL LOW (ref 3.5–5.0)
Alkaline Phosphatase: 466 U/L — ABNORMAL HIGH (ref 38–126)
Alkaline Phosphatase: 386 U/L — ABNORMAL HIGH (ref 38–126)
Anion gap: 8 (ref 5–15)
Anion gap: 7 (ref 5–15)
BUN: 11 mg/dL (ref 6–20)
BUN: 7 mg/dL (ref 6–20)
CALCIUM: 8.6 mg/dL — AB (ref 8.9–10.3)
CHLORIDE: 106 mmol/L (ref 101–111)
CHLORIDE: 106 mmol/L (ref 101–111)
CO2: 24 mmol/L (ref 22–32)
CO2: 24 mmol/L (ref 22–32)
CREATININE: 0.73 mg/dL (ref 0.44–1.00)
CREATININE: 0.6 mg/dL (ref 0.44–1.00)
Calcium: 9.1 mg/dL (ref 8.9–10.3)
GFR calc Af Amer: >60 mL/min (ref >60)
GFR calc non Af Amer: >60 mL/min (ref >60)
GLUCOSE: 95 mg/dL (ref 65–99)
Glucose, Bld: 109 mg/dL — ABNORMAL HIGH (ref 65–99)
POTASSIUM: 3.7 mmol/L (ref 3.5–5.1)
Potassium: 3.4 mmol/L — ABNORMAL LOW (ref 3.5–5.1)
SODIUM: 137 mmol/L (ref 135–145)
Sodium: 138 mmol/L (ref 135–145)
TOTAL PROTEIN: 7.6 g/dL (ref 6.5–8.1)
Total Bilirubin: 2.2 mg/dL — ABNORMAL HIGH (ref 0.3–1.2)
Total Bilirubin: 2 mg/dL — ABNORMAL HIGH (ref 0.3–1.2)
Total Protein: 7.1 g/dL (ref 6.5–8.1)

## 2016-09-12 LAB — PROTIME-INR
INR: 1.06
PROTHROMBIN TIME: 13.9 s (ref 11.4–15.2)

## 2016-09-12 SURGERY — ERCP, WITH INTERVENTION IF INDICATED
Anesthesia: General

## 2016-09-12 SURGERY — ERCP, WITH INTERVENTION IF INDICATED
Anesthesia: Choice

## 2016-09-12 MED ORDER — FENTANYL CITRATE (PF) 100 MCG/2ML IJ SOLN
INTRAMUSCULAR | Status: AC
Start: 2016-09-12 — End: 2016-09-12
  Filled 2016-09-12: qty 2

## 2016-09-12 MED ORDER — SODIUM CHLORIDE 0.9 % IV SOLN: Freq: Once | INTRAVENOUS | Status: DC

## 2016-09-12 MED ORDER — ENSURE ENLIVE PO LIQD
237.0000 mL | Freq: Two times a day (BID) | ORAL | Status: DC
  Filled 2016-09-12 (×3): qty 237

## 2016-09-12 MED ORDER — MIDAZOLAM HCL 2 MG/2ML IJ SOLN
INTRAMUSCULAR | Status: AC
Start: 2016-09-12 — End: 2016-09-12
  Filled 2016-09-12: qty 2

## 2016-09-12 MED ORDER — LIDOCAINE 2% (20 MG/ML) 5 ML SYRINGE
INTRAMUSCULAR | Status: AC
  Filled 2016-09-12: qty 5

## 2016-09-12 MED ORDER — SUGAMMADEX SODIUM 200 MG/2ML IV SOLN
INTRAVENOUS | Status: DC | PRN
  Administered 2016-09-12: 140 mg via INTRAVENOUS

## 2016-09-12 MED ORDER — POTASSIUM CHLORIDE 10 MEQ/100ML IV SOLN
10.0000 meq | INTRAVENOUS | Status: AC
  Administered 2016-09-12 (×4): 10 meq via INTRAVENOUS
  Filled 2016-09-12 (×6): qty 100

## 2016-09-12 MED ORDER — SUCCINYLCHOLINE CHLORIDE 200 MG/10ML IV SOSY
PREFILLED_SYRINGE | INTRAVENOUS | Status: AC
  Filled 2016-09-12: qty 10

## 2016-09-12 MED ORDER — BOOST / RESOURCE BREEZE PO LIQD
1.0000 | Freq: Three times a day (TID) | ORAL | Status: DC
  Administered 2016-09-12: 1 via ORAL
  Filled 2016-09-12 (×5): qty 1

## 2016-09-12 MED ORDER — FENTANYL CITRATE (PF) 100 MCG/2ML IJ SOLN
INTRAMUSCULAR | Status: DC | PRN
  Administered 2016-09-12: 50 ug via INTRAVENOUS
  Administered 2016-09-12: 100 ug via INTRAVENOUS

## 2016-09-12 MED ORDER — ONDANSETRON HCL 4 MG/2ML IJ SOLN
INTRAMUSCULAR | Status: DC | PRN
  Administered 2016-09-12: 4 mg via INTRAVENOUS

## 2016-09-12 MED ORDER — ONDANSETRON HCL 4 MG/2ML IJ SOLN
INTRAMUSCULAR | Status: AC
  Filled 2016-09-12: qty 2

## 2016-09-12 MED ORDER — FENTANYL CITRATE (PF) 100 MCG/2ML IJ SOLN
INTRAMUSCULAR | Status: AC
  Filled 2016-09-12: qty 2

## 2016-09-12 MED ORDER — ROCURONIUM BROMIDE 50 MG/5ML IV SOSY
PREFILLED_SYRINGE | INTRAVENOUS | Status: AC
  Filled 2016-09-12: qty 5

## 2016-09-12 MED ORDER — MORPHINE SULFATE (PF) 4 MG/ML IV SOLN: 2.0000 mg | INTRAVENOUS | Status: DC | PRN

## 2016-09-12 MED ORDER — POTASSIUM CHLORIDE 10 MEQ/100ML IV SOLN
10.0000 meq | INTRAVENOUS | Status: DC
  Administered 2016-09-12: 10 meq via INTRAVENOUS
  Filled 2016-09-12 (×2): qty 100

## 2016-09-12 MED ORDER — MIDAZOLAM HCL 5 MG/5ML IJ SOLN
INTRAMUSCULAR | Status: DC | PRN
  Administered 2016-09-12: 2 mg via INTRAVENOUS

## 2016-09-12 MED ORDER — PHENOL 1.4 % MT LIQD
1.0000 | OROMUCOSAL | Status: DC | PRN
  Administered 2016-09-12: 1 via OROMUCOSAL
  Filled 2016-09-12: qty 177

## 2016-09-12 MED ORDER — ROCURONIUM BROMIDE 10 MG/ML (PF) SYRINGE
PREFILLED_SYRINGE | INTRAVENOUS | Status: DC | PRN
  Administered 2016-09-12: 5 mg via INTRAVENOUS
  Administered 2016-09-12: 25 mg via INTRAVENOUS

## 2016-09-12 MED ORDER — PROPOFOL 10 MG/ML IV BOLUS
INTRAVENOUS | Status: DC | PRN
  Administered 2016-09-12: 160 mg via INTRAVENOUS

## 2016-09-12 MED ORDER — PROPOFOL 10 MG/ML IV BOLUS
INTRAVENOUS | Status: AC
  Filled 2016-09-12: qty 20

## 2016-09-12 MED ORDER — CIPROFLOXACIN IN D5W 400 MG/200ML IV SOLN
400.0000 mg | Freq: Two times a day (BID) | INTRAVENOUS | Status: DC
  Administered 2016-09-12: 400 mg via INTRAVENOUS
  Filled 2016-09-12: qty 200

## 2016-09-12 MED ORDER — SUGAMMADEX SODIUM 200 MG/2ML IV SOLN
INTRAVENOUS | Status: AC
  Filled 2016-09-12: qty 2

## 2016-09-12 MED ORDER — LIDOCAINE 2% (20 MG/ML) 5 ML SYRINGE
INTRAMUSCULAR | Status: DC | PRN
  Administered 2016-09-12: 60 mg via INTRAVENOUS

## 2016-09-12 MED ORDER — SUCCINYLCHOLINE CHLORIDE 200 MG/10ML IV SOSY
PREFILLED_SYRINGE | INTRAVENOUS | Status: DC | PRN
  Administered 2016-09-12: 140 mg via INTRAVENOUS

## 2016-09-12 MED ORDER — GLUCAGON HCL RDNA (DIAGNOSTIC) 1 MG IJ SOLR
INTRAMUSCULAR | Status: DC | PRN
  Administered 2016-09-12 (×2): .5 mg via INTRAVENOUS

## 2016-09-12 MED ORDER — PHENYLEPHRINE 40 MCG/ML (10ML) SYRINGE FOR IV PUSH (FOR BLOOD PRESSURE SUPPORT)
PREFILLED_SYRINGE | INTRAVENOUS | Status: DC | PRN
  Administered 2016-09-12: 80 ug via INTRAVENOUS

## 2016-09-12 MED ORDER — ENOXAPARIN SODIUM 40 MG/0.4ML ~~LOC~~ SOLN
40.0000 mg | SUBCUTANEOUS | Status: DC
  Filled 2016-09-12: qty 0.4

## 2016-09-12 MED ORDER — IOPAMIDOL (ISOVUE-300) INJECTION 61%
INTRAVENOUS | Status: AC
  Administered 2016-09-12: 100 mL
  Filled 2016-09-12: qty 100

## 2016-09-12 NOTE — H&P (View-Only) (Signed)
[facility] Consult Note   History Chelsea Cannon MRN # [id number]  Date of Admission: 09/11/2016 Date of Consultation: 09/12/2016 Referring physician: Dr. Debbe Odea, MD  Reason for Consultation/Chief Complaint: Jaundice, recent choledocholithiasis  Subjective  HPI:  This is a 51 year old woman well known to me from 2 recent ERCPs for CBD stone. She was recently seen in mid February at the time of admission to [facility] with abdominal pain and elevated LFTs. She had undergone cholecystectomy several months prior, at which point LFTs were normal. Laparoscopic cholecystectomy was performed, IOC was attempted but could not be completed. MRCP at that time did not see CBD stone. She was well until mid February when she was admitted with abdominal pain and elevated LFTs. MRCP suggested at least 1 common bile duct stone. She underwent ERCP, where I saw a 10-12 mm stone best seen on image 4 of the cholangiogram series. I was unable to remove the stone with a balloon, and a biliary stent was placed. I had also placed a plastic pancreatic stent in order to facilitate cannulation of the bile duct. Patient went home from that admission, and was brought back last week for an outpatient ERCP. The stents were removed, no stone was evident on cholangiogram, and it seemed it may have been mechanically broken down by the placement of the stent. Spyglass cholangioscopy had excellent visualization of the entire common hepatic and common bile duct and no stone was seen. The patient had some upper respiratory distress afterwards due to difficult intubation, she was kept overnight and discharged following day. She said that 5 days ago she called our on call physician (? Dr. Benson Norway) because she had a fever 102.5. I was unaware until interviewing her today that she had had that fever episode. She reports being prescribed ciprofloxacin. She felt better over the next few days, and two days ago was the  first time she had felt well in over a week. She had no abdominal pain no jaundice no fever and was eating well with no abdominal pain. Repeat LFTs done later that day and resulted Thursday morning revealed a significant rise in her LFTs compared to what they were at the time of discharge the week prior. She had been feeling fatigued said her urine had turned dark and I recommended she go to the emergency room for admission.  She had some brief epigastric pain yesterday, but none overnight or today. She has had no fever since admission. She was started on Zosyn initially, but I changed it to ciprofloxacin last evening. She has not taken any other medicines besides the ciprofloxacin and antibiotic she received from her ERCP last week. She is not known to have any personal or family history of underlying liver disease.  ROS:  No recent weight loss Denies chest pain or dyspnea She had anxiety for several days after the episode of post-ERCP respiratory distress, but that has subsided.  All other systems are negative except as noted above in the HPI  Past Medical History Past Medical History:  Diagnosis Date  . Anemia   . Gallstones   . Jaundice   . Menorrhagia   . Retained gallstones following laparoscopic cholecystectomy     Past Surgical History Past Surgical History:  Procedure Laterality Date  . CHOLECYSTECTOMY N/A 03/22/2016   Procedure: LAPAROSCOPIC CHOLECYSTECTOMY WITH INTRAOPERATIVE CHOLANGIOGRAM;  Surgeon: Georganna Skeans, MD;  Location: [facility];  Service: General;  Laterality: N/A;  . ERCP N/A 08/08/2016   Procedure: ENDOSCOPIC RETROGRADE  CHOLANGIOPANCREATOGRAPHY (ERCP);  Surgeon: Doran Stabler, MD;  Location[facility] ENDOSCOPY;  Service: Endoscopy;  Laterality: N/A;  . ERCP N/A 09/03/2016   Procedure: ENDOSCOPIC RETROGRADE CHOLANGIOPANCREATOGRAPHY (ERCP);  Surgeon: Doran Stabler, MD;  Location: [facility] ENDOS[facility];  Service: Gastroenterology;  Laterality: N/A;  . SPYGLASS CHOLANGIOSCOPY  N/A 09/03/2016   Procedure: PJASNKNL CHOLANGIOSCOPY;  Surgeon: Doran Stabler, MD;  Location: [facility] ENDOSCOPY;  Service: Gastroenterology;  Laterality: N/A;    Family History Family History  Problem Relation Age of Onset  . Colon cancer Mother   . Diabetes Father   . Diabetes Sister     Social History Social History   Social History  . Marital status: Married    Spouse name: N/A  . Number of children: N/A  . Years of education: N/A   Occupational History  . unemployed    Social History Main Topics  . Smoking status: Never Smoker  . Smokeless tobacco: Never Used  . Alcohol use No  . Drug use: No  . Sexual activity: Not Asked   Other Topics Concern  . None   Social History Narrative  . None    Allergies No Known Allergies  Outpatient Meds Home medications from the H+P and/or nursing med reconciliation reviewed.  Inpatient med list reviewed  _____________________________________________________________________ Objective   Exam:  Current vital signs  Patient Vitals for the past 8 hrs:  BP Temp Temp src Pulse Resp SpO2  09/12/16 0535 108/65 98.4 F (36.9 C) Oral 79 18 100 %    Intake/Output Summary (Last 24 hours) at 09/12/16 9767 Last data filed at 09/12/16 0600  Gross per 24 hour  Intake          1226.67 ml  Output                0 ml  Net          1226.67 ml    Physical Exam:  Her husband is present at the bedside  General: this is a well-appearing female patient in no acute distress  Eyes: sclera mildly icteric, no redness  ENT: oral mucosa moist without lesions, no cervical or supraclavicular lymphadenopathy, fair dentition  CV: RRR without murmur, S1/S2, no JVD,, no peripheral edema. SCDs in place  Resp: clear to auscultation bilaterally, normal RR and effort noted  GI: soft, no tenderness, with active bowel sounds. No guarding or palpable organomegaly noted  Skin; warm and dry, no rash. She is slightly jaundiced  Neuro: awake,  alert and oriented x 3. Normal gross motor function and fluent speech.  Labs:   Recent Labs Lab 09/10/16 1240 09/11/16 1718 09/12/16 0519  WBC 12.3* 10.9* 13.3*  HGB 10.1* 9.5* 9.6*  HCT 31.1* 28.4* 29.1*  PLT 298.0 339 375    Recent Labs Lab 09/12/16 0519  NA 138  K 3.7  CL 106  CO2 24  BUN 11  ALBUMIN 3.1*  ALKPHOS 466*  ALT 406*  AST 184*  GLUCOSE 109*    Recent Labs Lab 09/12/16 0519  INR 1.06    Radiologic studies: Prior cholangiogram images were reviewed  @ASSESSMENTPLANBEGIN @ Impression:  Jaundice Elevated LFTs Recent choledocholithiasis.  I suspect there must be retained stone in the biliary tree and I'm concerned that it has caused some intermittent low-grade infection with recent fever. If there was a stone that had migrated into the intrahepatic biliary tree that it might not be visualized on cholangiogram nor on passage of the cholangioscope, a stone of that  size would not likely cause biliary obstruction if it then passed into the more distal bile duct.  A retained stone in the cystic duct stump is possible since an IOC was not technically feasible for some reason (see images), but one would expect to see those when the structure fills with contrast on ERCP images. At any rate, retained stone still seems the most likely explanation for this clinical picture.  She has anemia that has only recently become microcytic with low normal ferritin and normal B12. She has not had any overt GI bleeding.   Plan:  Continue ciprofloxacin 400 mg IV twice daily  ERCP today with Dr. [name]. He is familiar with the patient from our discussion yesterday and he is reviewed all of the imaging and labs. Timing of that procedure is to be determined based on the availability of anesthesia in either the endoscopy lab or the OR. She is agreeable to the ERCP after a thorough discussion of the procedure and risks. Naturally, she is familiar with that, having had it  done twice so far.  The benefits and risks of the planned procedure were described in detail with the patient or (when appropriate) their health care proxy.  Risks were outlined as including, but not limited to, bleeding, infection, perforation, adverse medication reaction leading to cardiac or pulmonary decompensation, or pancreatitis (if ERCP).  The limitation of incomplete mucosal visualization was also discussed.  No guarantees or warranties were given.   Further workup of anemia to follow after resolution of this biliary issue.    Thank you for the courtesy of this consult.  Please contact me with any questions or concerns.  Nelida Meuse III Pager: [phone] Mon-Fri 8a-5p [phone] after 5p, weekends, holidays

## 2016-09-12 NOTE — Anesthesia Preprocedure Evaluation (Signed)
Anesthesia Evaluation  Patient identified by MRN, date of birth, ID band Patient awake    Reviewed: Allergy & Precautions, NPO status , Patient's Chart, lab work & pertinent test results  Airway Mallampati: II  TM Distance: >3 FB Neck ROM: Full    Dental no notable dental hx. (+) Dental Advisory Given   Pulmonary neg pulmonary ROS,    Pulmonary exam normal breath sounds clear to auscultation       Cardiovascular negative cardio ROS Normal cardiovascular exam Rhythm:Regular Rate:Normal     Neuro/Psych negative neurological ROS  negative psych ROS   GI/Hepatic negative GI ROS, Neg liver ROS,   Endo/Other  negative endocrine ROS  Renal/GU negative Renal ROS  negative genitourinary   Musculoskeletal negative musculoskeletal ROS (+)   Abdominal   Peds negative pediatric ROS (+)  Hematology negative hematology ROS (+)   Anesthesia Other Findings   Reproductive/Obstetrics negative OB ROS                             Anesthesia Physical  Anesthesia Plan  ASA: II  Anesthesia Plan: General   Post-op Pain Management:    Induction: Intravenous  Airway Management Planned: Oral ETT and Video Laryngoscope Planned  Additional Equipment:   Intra-op Plan:   Post-operative Plan: Extubation in OR  Informed Consent: I have reviewed the patients History and Physical, chart, labs and discussed the procedure including the risks, benefits and alternatives for the proposed anesthesia with the patient or authorized representative who has indicated his/her understanding and acceptance.   Dental advisory given  Plan Discussed with: CRNA and Surgeon  Anesthesia Plan Comments:         Anesthesia Quick Evaluation

## 2016-09-12 NOTE — Progress Notes (Signed)
PROGRESS NOTE    Chelsea Cannon   YIR:[id number]  DOB: 1966-03-11  DOA: 09/11/2016 PCP: Lindie Spruce, MD   Brief Narrative:  Chelsea Cannon is a 51 y.o. female with medical history significant of retained gallstones s/p cholecystectomy who was sent by GI due to increasing LFTs.  Had gallbladder taken out in 10/17.  Had retained stones in CBD, had stent placed in February via ERCP.  March 14, repeat ERCP showed that the stone was disintegrated and the stent was removed.  Yesterday, she went for bloodwork and today they called and said that her LFTs were doubling and they encouraged her to come to the ER to further assessment.  She has had ongoing fatigue, weakness, mild epigastric TTP. She was actually feeling better finally last night and this AM prior to the call encouraging her to come in.  Subjective: She has not had nausea, vomiting, abdominal pain or diarrhea. Stool in normal in color. She began having solid food yesterday and was tolerating it.  Assessment & Plan:   Principal Problem:   Elevated  - sent to hospital for this- T bili is improving - ERCP done today shows patent CBD - GI has ordered a CT abd and further hepatic function panel  Active Problems:   Anemia - chronic- follow    Hyperglycemia - last A1c last month was 5.4    Hypokalemia - has been replaced   DVT prophylaxis: Lovenox Code Status: Full code Family Communication:  Disposition Plan: cont to follow  Consultants:   GI Procedures:   ERCP Antimicrobials:  Anti-infectives    Start     Dose/Rate Route Frequency Ordered Stop   09/12/16 0932  ciprofloxacin (CIPRO) IVPB 400 mg  Status:  Discontinued     400 mg 200 mL/hr over 60 Minutes Intravenous Every 12 hours 09/12/16 0932 09/12/16 1522   09/12/16 0400  piperacillin-tazobactam (ZOSYN) IVPB 3.375 g  Status:  Discontinued     3.375 g 12.5 mL/hr over 240 Minutes Intravenous Every 8 hours 09/11/16 1904 09/11/16 2115   09/11/16 1800   piperacillin-tazobactam (ZOSYN) IVPB 3.375 g     3.375 g 100 mL/hr over 30 Minutes Intravenous  Once 09/11/16 1755 09/11/16 2040       Objective: Vitals:   09/12/16 1243 09/12/16 1425 09/12/16 1428 09/12/16 1523  BP: 130/78 125/72  125/83  Pulse: 80   86  Resp:    18  Temp: 98.2 F (36.8 C)  97.7 F (36.5 C) 97.5 F (36.4 C)  TempSrc: Oral   Oral  SpO2: 99%   100%  Weight:      Height:        Intake/Output Summary (Last 24 hours) at 09/12/16 1639 Last data filed at 09/12/16 1425  Gross per 24 hour  Intake          1826.67 ml  Output                5 ml  Net          1821.67 ml   Filed Weights   09/11/16 1621  Weight: 66.8 kg (147 lb 3.2 oz)    Examination: General exam: Appears comfortable  HEENT: PERRLA, oral mucosa moist, no sclera icterus or thrush Respiratory system: Clear to auscultation. Respiratory effort normal. Cardiovascular system: S1 & S2 heard, RRR.  No murmurs  Gastrointestinal system: Abdomen soft, non-tender, nondistended. Normal bowel sound. No organomegaly Central nervous system: Alert and oriented. No focal neurological deficits. Extremities: No cyanosis,  clubbing or edema Skin: No rashes or ulcers Psychiatry:  Mood & affect appropriate.     Data Reviewed: I have personally reviewed following labs and imaging studies  CBC:  Recent Labs Lab 09/10/16 1240 09/11/16 1718 09/12/16 0519  WBC 12.3* 10.9* 13.3*  NEUTROABS 8.9* 8.1*  --   HGB 10.1* 9.5* 9.6*  HCT 31.1* 28.4* 29.1*  MCV 78.5 75.7* 77.6*  PLT 298.0 339 947   Basic Metabolic Panel:  Recent Labs Lab 09/10/16 1240 09/11/16 1718 09/12/16 0519  NA 133* 137 138  K 3.3* 2.9* 3.7  CL 98 103 106  CO2 26 24 24   GLUCOSE 143* 107* 109*  BUN 9 11 11   CREATININE 0.72 0.96 0.73  CALCIUM 9.4 9.1 9.1   GFR: Estimated Creatinine Clearance: 77.9 mL/min (by C-G formula based on SCr of 0.73 mg/dL). Liver Function Tests:  Recent Labs Lab 09/10/16 1240 09/11/16 1718  09/12/16 0519  AST 166* 132* 184*  ALT 558* 431* 406*  ALKPHOS 499* 514* 466*  BILITOT 5.7* 3.0* 2.2*  PROT 7.4 8.0 7.6  ALBUMIN 3.7 3.4* 3.1*   No results for input(s): LIPASE, AMYLASE in the last 168 hours. No results for input(s): AMMONIA in the last 168 hours. Coagulation Profile:  Recent Labs Lab 09/12/16 0519  INR 1.06   Cardiac Enzymes: No results for input(s): CKTOTAL, CKMB, CKMBINDEX, TROPONINI in the last 168 hours. BNP (last 3 results) No results for input(s): PROBNP in the last 8760 hours. HbA1C: No results for input(s): HGBA1C in the last 72 hours. CBG: No results for input(s): GLUCAP in the last 168 hours. Lipid Profile: No results for input(s): CHOL, HDL, LDLCALC, TRIG, CHOLHDL, LDLDIRECT in the last 72 hours. Thyroid Function Tests: No results for input(s): TSH, T4TOTAL, FREET4, T3FREE, THYROIDAB in the last 72 hours. Anemia Panel:  Recent Labs  09/10/16 1240  VITAMINB12 1,403*  FERRITIN 27.3   Urine analysis: No results found for: COLORURINE, APPEARANCEUR, LABSPEC, PHURINE, GLUCOSEU, HGBUR, BILIRUBINUR, KETONESUR, PROTEINUR, UROBILINOGEN, NITRITE, LEUKOCYTESUR Sepsis Labs: @LABRCNTIP (procalcitonin:4,lacticidven:4) )No results found for this or any previous visit (from the past 240 hour(s)).       Radiology Studies: Dg Ercp  Result Date: 09/12/2016 CLINICAL DATA:  Bile duct stone EXAM: ERCP TECHNIQUE: Multiple spot images obtained with the fluoroscopic device and submitted for interpretation post-procedure. FLUOROSCOPY TIME:  7 minutes 42 seconds COMPARISON:  09/03/2016 FINDINGS: A series of fluoroscopic spot images document endoscopic cannulation and opacification of the CBD. Subsequent images document endoscopic CBD intervention. There is progressive opacification of the intrahepatic biliary tree which appears mildly distended or dilated. No discrete lesion or persistent filling defect identified. No extravasation. Balloon passage through the  common duct documented. IMPRESSION: CBD endoscopic cannulation and intervention as above. These images were submitted for radiologic interpretation only. Please see the procedural report for the amount of contrast and the fluoroscopy time utilized. Electronically Signed   By: Lucrezia Europe M.D.   On: 09/12/2016 14:35      Scheduled Meds: . feeding supplement  1 Container Oral TID BM  . feeding supplement (ENSURE ENLIVE)  237 mL Oral BID BM   Continuous Infusions: . lactated ringers 100 mL/hr at 09/12/16 0652     LOS: 1 day    Time spent in minutes: 87    [place], MD [facility] Pager: [email or web address] Password [facility] 09/12/2016, 4:39 PM

## 2016-09-12 NOTE — Progress Notes (Signed)
Initial Nutrition Assessment  DOCUMENTATION CODES:   Not applicable  INTERVENTION:   Ensure Enlive po BID, each supplement provides 350 kcal and 20 grams of protein  Boost Breeze po TID, each supplement provides 250 kcal and 9 grams of protein  NUTRITION DIAGNOSIS:   Unintentional weight loss related to acute illness, gallstones as evidenced by 8 percent weight loss in one month.  GOAL:   Patient will meet greater than or equal to 90% of their needs  MONITOR:   PO intake, Supplement acceptance, Labs, Weight trends  REASON FOR ASSESSMENT:   Malnutrition Screening Tool    ASSESSMENT:   51 y.o. female with medical history significant of retained gallstones s/p cholecystectomy who was sent by GI due to increasing LFTs.  Had gallbladder taken out in 10/17.  Had retained stones in CBD, had stent placed in February via ERCP.  March 14, repeat ERCP showed that the stone was disintegrated and the stent was removed.  Yesterday, she went for bloodwork and today they called and said that her LFTs were doubling and they encouraged her to come to the ER to further assessment.  She has had ongoing fatigue, weakness, mild epigastric TTP. She was actually feeling better finally last night and this AM prior to the call encouraging her to come in.   Patient with h/o cholecystectomy for gallstones who previously had a retained stone is now presenting with an apparent obstructive jaundice picture concerning for another retained stone. RD unable to see pt today; pt in procedure at time of visit. Pt having ERCP today. Per chart, pt has lost 13lbs(8%) in the past month which is significant. Pt documented eating 0% last night prior to NPO. RD will order supplements that can be offered to pt once diet advanced. Pt with hypokalemia on admit; resolved now. RD will continue to follow for nutritional needs.   Medications reviewed and include: ciprofloxacin, LRS   Labs reviewed: AlkPhos 466(H), alb 3.1(L), AST  184(H), ALT 406(H), tbili 2.2(H) Wbc- 13.3(H), Hgb 9.6(L), Hct 29.1(L)  Unable to complete Nutrition-Focused physical exam at this time.   Diet Order:  Diet NPO time specified  Skin:  Reviewed, no issues  Last BM:  3/21  Height:   Ht Readings from Last 1 Encounters:  09/11/16 5\' 6"  (1.676 m)    Weight:   Wt Readings from Last 1 Encounters:  09/11/16 147 lb 3.2 oz (66.8 kg)    Ideal Body Weight:  59 kg  BMI:  Body mass index is 23.76 kg/m.  Estimated Nutritional Needs:   Kcal:  1700-200kcal/day   Protein:  74-87g/day   Fluid:  >1.7L/day   EDUCATION NEEDS:   No education needs identified at this time  Koleen Distance, RD, LDN Pager #[phone]

## 2016-09-12 NOTE — Anesthesia Postprocedure Evaluation (Addendum)
Anesthesia Post Note  Patient: Chelsea Cannon  Procedure(s) Performed: Procedure(s) (LRB): ENDOSCOPIC RETROGRADE CHOLANGIOPANCREATOGRAPHY (ERCP) (N/A)  Patient location during evaluation: PACU Anesthesia Type: General Level of consciousness: sedated Pain management: pain level controlled Vital Signs Assessment: post-procedure vital signs reviewed and stable Respiratory status: spontaneous breathing and respiratory function stable Cardiovascular status: stable Anesthetic complications: no       Last Vitals:  Vitals:   09/12/16 1425 09/12/16 1428  BP: 125/72   Pulse:    Resp:    Temp:  36.5 C    Last Pain:  Vitals:   09/12/16 1243  TempSrc: Oral  PainSc:                  Kashten Gowin DANIEL

## 2016-09-12 NOTE — Transfer of Care (Signed)
Immediate Anesthesia Transfer of Care Note  Patient: Chelsea Cannon  Procedure(s) Performed: Procedure(s): ENDOSCOPIC RETROGRADE CHOLANGIOPANCREATOGRAPHY (ERCP) (N/A)  Patient Location: PACU  Anesthesia Type:General  Level of Consciousness:  sedated, patient cooperative and responds to stimulation  Airway & Oxygen Therapy:Patient Spontanous Breathing and Patient connected to face mask oxgen  Post-op Assessment:  Report given to PACU RN and Post -op Vital signs reviewed and stable  Post vital signs:  Reviewed and stable  Last Vitals:  Vitals:   09/12/16 1152 09/12/16 1243  BP: 116/69 130/78  Pulse: 75 80  Resp: 18   Temp: 36.9 C 17.5 C    Complications: No apparent anesthesia complications '

## 2016-09-12 NOTE — Anesthesia Procedure Notes (Signed)
Procedure Name: Intubation Date/Time: 09/12/2016 1:15 PM Performed by: Anne Fu Pre-anesthesia Checklist: Patient identified, Emergency Drugs available, Suction available, Patient being monitored and Timeout performed Patient Re-evaluated:Patient Re-evaluated prior to inductionOxygen Delivery Method: Circle system utilized Preoxygenation: Pre-oxygenation with 100% oxygen Intubation Type: IV induction Ventilation: Mask ventilation without difficulty Laryngoscope Size: Mac and 4 Grade View: Grade I Tube type: Oral Tube size: 7.0 mm Number of attempts: 1 Airway Equipment and Method: Stylet and Video-laryngoscopy Placement Confirmation: ETT inserted through vocal cords under direct vision,  positive ETCO2,  CO2 detector and breath sounds checked- equal and bilateral Secured at: 19 cm Tube secured with: Tape Dental Injury: Teeth and Oropharynx as per pre-operative assessment  Difficulty Due To: Difficulty was anticipated and Difficult Airway- due to anterior larynx Future Recommendations: Recommend- induction with short-acting agent, and alternative techniques readily available Comments: Grade 1 view without difficulty using Video Glidescope X 1

## 2016-09-12 NOTE — Consult Note (Signed)
[facility] Consult Note   History Chelsea Cannon MRN # [id number]  Date of Admission: 09/11/2016 Date of Consultation: 09/12/2016 Referring physician: Dr. Debbe Odea, MD  Reason for Consultation/Chief Complaint: Jaundice, recent choledocholithiasis  Subjective  HPI:  This is a 51 year old woman well known to me from 2 recent ERCPs for CBD stone. She was recently seen in mid February at the time of admission to [facility] with abdominal pain and elevated LFTs. She had undergone cholecystectomy several months prior, at which point LFTs were normal. Laparoscopic cholecystectomy was performed, IOC was attempted but could not be completed. MRCP at that time did not see CBD stone. She was well until mid February when she was admitted with abdominal pain and elevated LFTs. MRCP suggested at least 1 common bile duct stone. She underwent ERCP, where I saw a 10-12 mm stone best seen on image 4 of the cholangiogram series. I was unable to remove the stone with a balloon, and a biliary stent was placed. I had also placed a plastic pancreatic stent in order to facilitate cannulation of the bile duct. Patient went home from that admission, and was brought back last week for an outpatient ERCP. The stents were removed, no stone was evident on cholangiogram, and it seemed it may have been mechanically broken down by the placement of the stent. Spyglass cholangioscopy had excellent visualization of the entire common hepatic and common bile duct and no stone was seen. The patient had some upper respiratory distress afterwards due to difficult intubation, she was kept overnight and discharged following day. She said that 5 days ago she called our on call physician (? Dr. Benson Norway) because she had a fever 102.5. I was unaware until interviewing her today that she had had that fever episode. She reports being prescribed ciprofloxacin. She felt better over the next few days, and two days ago was the  first time she had felt well in over a week. She had no abdominal pain no jaundice no fever and was eating well with no abdominal pain. Repeat LFTs done later that day and resulted Thursday morning revealed a significant rise in her LFTs compared to what they were at the time of discharge the week prior. She had been feeling fatigued said her urine had turned dark and I recommended she go to the emergency room for admission.  She had some brief epigastric pain yesterday, but none overnight or today. She has had no fever since admission. She was started on Zosyn initially, but I changed it to ciprofloxacin last evening. She has not taken any other medicines besides the ciprofloxacin and antibiotic she received from her ERCP last week. She is not known to have any personal or family history of underlying liver disease.  ROS:  No recent weight loss Denies chest pain or dyspnea She had anxiety for several days after the episode of post-ERCP respiratory distress, but that has subsided.  All other systems are negative except as noted above in the HPI  Past Medical History Past Medical History:  Diagnosis Date  . Anemia   . Gallstones   . Jaundice   . Menorrhagia   . Retained gallstones following laparoscopic cholecystectomy     Past Surgical History Past Surgical History:  Procedure Laterality Date  . CHOLECYSTECTOMY N/A 03/22/2016   Procedure: LAPAROSCOPIC CHOLECYSTECTOMY WITH INTRAOPERATIVE CHOLANGIOGRAM;  Surgeon: Georganna Skeans, MD;  Location: [facility];  Service: General;  Laterality: N/A;  . ERCP N/A 08/08/2016   Procedure: ENDOSCOPIC RETROGRADE  CHOLANGIOPANCREATOGRAPHY (ERCP);  Surgeon: Doran Stabler, MD;  Location: [facility] ENDOSCOPY;  Service: Endoscopy;  Laterality: N/A;  . ERCP N/A 09/03/2016   Procedure: ENDOSCOPIC RETROGRADE CHOLANGIOPANCREATOGRAPHY (ERCP);  Surgeon: Doran Stabler, MD;  Location: [facility]OSCOPY;  Service: Gastroenterology;  Laterality: N/A;  . SPYGLASS CHOLANGIOSCOPY  N/A 09/03/2016   Procedure: [id number] CHOLANGIOSCOPY;  Surgeon: Doran Stabler, MD;  Location: [facility] ENDOSCOPY;  Service: Gastroenterology;  Laterality: N/A;    Family History Family History  Problem Relation Age of Onset  . Colon cancer Mother   . Diabetes Father   . Diabetes Sister     Social History Social History   Social History  . Marital status: Married    Spouse name: N/A  . Number of children: N/A  . Years of education: N/A   Occupational History  . unemployed    Social History Main Topics  . Smoking status: Never Smoker  . Smokeless tobacco: Never Used  . Alcohol use No  . Drug use: No  . Sexual activity: Not Asked   Other Topics Concern  . None   Social History Narrative  . None    Allergies No Known Allergies  Outpatient Meds Home medications from the H+P and/or nursing med reconciliation reviewed.  Inpatient med list reviewed  _____________________________________________________________________ Objective   Exam:  Current vital signs  Patient Vitals for the past 8 hrs:  BP Temp Temp src Pulse Resp SpO2  09/12/16 0535 108/65 98.4 F (36.9 C) Oral 79 18 100 %    Intake/Output Summary (Last 24 hours) at 09/12/16 3532 Last data filed at 09/12/16 0600  Gross per 24 hour  Intake          1226.67 ml  Output                0 ml  Net          1226.67 ml    Physical Exam:  Her husband is present at the bedside  General: this is a well-appearing female patient in no acute distress  Eyes: sclera mildly icteric, no redness  ENT: oral mucosa moist without lesions, no cervical or supraclavicular lymphadenopathy, fair dentition  CV: RRR without murmur, S1/S2, no JVD,, no peripheral edema. SCDs in place  Resp: clear to auscultation bilaterally, normal RR and effort noted  GI: soft, no tenderness, with active bowel sounds. No guarding or palpable organomegaly noted  Skin; warm and dry, no rash. She is slightly jaundiced  Neuro: awake,  alert and oriented x 3. Normal gross motor function and fluent speech.  Labs:   Recent Labs Lab 09/10/16 1240 09/11/16 1718 09/12/16 0519  WBC 12.3* 10.9* 13.3*  HGB 10.1* 9.5* 9.6*  HCT 31.1* 28.4* 29.1*  PLT 298.0 339 375    Recent Labs Lab 09/12/16 0519  NA 138  K 3.7  CL 106  CO2 24  BUN 11  ALBUMIN 3.1*  ALKPHOS 466*  ALT 406*  AST 184*  GLUCOSE 109*    Recent Labs Lab 09/12/16 0519  INR 1.06    Radiologic studies: Prior cholangiogram images were reviewed  @ASSESSMENTPLANBEGIN @ Impression:  Jaundice Elevated LFTs Recent choledocholithiasis.  I suspect there must be retained stone in the biliary tree and I'm concerned that it has caused some intermittent low-grade infection with recent fever. If there was a stone that had migrated into the intrahepatic biliary tree that it might not be visualized on cholangiogram nor on passage of the cholangioscope, a stone of that  size would not likely cause biliary obstruction if it then passed into the more distal bile duct.  A retained stone in the cystic duct stump is possible since an IOC was not technically feasible for some reason (see images), but one would expect to see those when the structure fills with contrast on ERCP images. At any rate, retained stone still seems the most likely explanation for this clinical picture.  She has anemia that has only recently become microcytic with low normal ferritin and normal B12. She has not had any overt GI bleeding.   Plan:  Continue ciprofloxacin 400 mg IV twice daily  ERCP today with Dr. [name]. He is familiar with the patient from our discussion yesterday and he is reviewed all of the imaging and labs. Timing of that procedure is to be determined based on the availability of anesthesia in either the endoscopy lab or the OR. She is agreeable to the ERCP after a thorough discussion of the procedure and risks. Naturally, she is familiar with that, having had it  done twice so far.  The benefits and risks of the planned procedure were described in detail with the patient or (when appropriate) their health care proxy.  Risks were outlined as including, but not limited to, bleeding, infection, perforation, adverse medication reaction leading to cardiac or pulmonary decompensation, or pancreatitis (if ERCP).  The limitation of incomplete mucosal visualization was also discussed.  No guarantees or warranties were given.   Further workup of anemia to follow after resolution of this biliary issue.    Thank you for the courtesy of this consult.  Please contact me with any questions or concerns.  Nelida Meuse III Pager: [phone] Mon-Fri 8a-5p [phone] after 5p, weekends, holidays

## 2016-09-12 NOTE — Op Note (Signed)
[facility] Patient Name: Chelsea Cannon Procedure Date: 09/12/2016 MRN: [id number] Attending MD: Docia Chuck. Henrene Pastor , MD Date of Birth: 11-07-1965 CSN: [id number] Age: 51 Admit Type: Inpatient Procedure:                ERCP Indications:              Elevated liver enzymes Providers:                Docia Chuck. Henrene Pastor, MD, Hilma Favors, RN, Cherylynn Ridges, Technician, Anne Fu CRNA, CRNA Referring MD:              Medicines:                General Anesthesia Complications:            No immediate complications. Estimated Blood Loss:     Estimated blood loss: none. Procedure:                Pre-Anesthesia Assessment:                           - Prior to the procedure, a History and Physical                            was performed, and patient medications and                            allergies were reviewed. The patient's tolerance of                            previous anesthesia was also reviewed. The risks                            and benefits of the procedure and the sedation                            options and risks were discussed with the patient.                            All questions were answered, and informed consent                            was obtained. Prior Anticoagulants: The patient has                            taken no previous anticoagulant or antiplatelet                            agents. ASA Grade Assessment: II - A patient with                            mild systemic disease. After reviewing the risks  and benefits, the patient was deemed in                            satisfactory condition to undergo the procedure.                           After obtaining informed consent, the scope was                            passed under direct vision. Throughout the                            procedure, the patient's blood pressure, pulse, and                            oxygen saturations  were monitored continuously. The                            [id number] ([id number]) scope was introduced through                            the mouth, and used to inject contrast into and                            used to inject contrast into the bile duct. The                            ERCP was accomplished without difficulty. The                            patient tolerated the procedure well. Scope In: Scope Out: Findings:      A scout film of the abdomen was obtained. Surgical clips, consistent       with a previous cholecystectomy, were seen in the area of the right       upper quadrant of the abdomen. The esophagus was successfully intubated       under direct vision. The scope was advanced to the major papilla in the       descending duodenum without detailed examination of the pharynx, larynx       and associated structures, and upper GI tract. The upper GI tract was       grossly normal. There was evidence of prior sphincterotomy of moderate       size. The bile duct was deeply cannulated with the traction (standard)       sphincterotome. Contrast was injected. Opacification of the main bile       duct and entire biliary tree was successful. A cholecystectomy had been       performed previously. The pain duct was of normal caliber without       filling defects. There was slight dilation in the region of the common       hepatic, left and right hepatic. There was excellent filling of the       entire biliary system. The C-arm was moved to obtain multiple angles.       There was no evidence for stricture or filling defects. A  sequential       balloon was pulled through the biliary system multiple occasions.       Maximal diameter 15 mm. Excellent drainage present. Impression:               1. Normal biliary tree post cholecystectomy                           2. Prior sphincterotomy                           3. Status post cholecystectomy                           4. Elevated liver  tests of uncertain etiology. He                            had fever 5 days ago and was given antibiotic.                            Question drug reaction. Rule out intrahepatic                            abscess. Rule out viral hepatitis (unlikely). Moderate Sedation:      none Recommendation:           1. Continue antibiotics.                           2. Contrast-enhanced CT scan of the abdomen. Rule                            out hepatic abscess                           3. Acute hepatitis serologies Procedure Code(s):        --- Professional ---                           [phone], Endoscopic retrograde                            cholangiopancreatography (ERCP); diagnostic,                            including collection of specimen(s) by brushing or                            washing, when performed (separate procedure) Diagnosis Code(s):        --- Professional ---                           Z90.49, Acquired absence of other specified parts                            of digestive tract  R74.8, Abnormal levels of other serum enzymes CPT copyright 2016 [employer]. All rights reserved. The codes documented in this report are preliminary and upon coder review may  be revised to meet current compliance requirements. Docia Chuck. Henrene Pastor, MD 09/12/2016 2:19:04 PM This report has been signed electronically. Number of Addenda: 0

## 2016-09-12 NOTE — Interval H&P Note (Signed)
History and Physical Interval Note:  09/12/2016 12:46 PM  Chelsea Cannon  has presented today for surgery, with the diagnosis of jaundice  The various methods of treatment have been discussed with the patient and family. After consideration of risks, benefits and other options for treatment, the patient has consented to  Procedure(s): ENDOSCOPIC RETROGRADE CHOLANGIOPANCREATOGRAPHY (ERCP) (N/A) as a surgical intervention .  The patient's history has been reviewed, patient examined, no change in status, stable for surgery.  I have reviewed the patient's chart and labs.  Questions were answered to the patient's satisfaction.     [name]

## 2016-09-13 LAB — HEPATIC FUNCTION PANEL
ALT: 358 U/L — ABNORMAL HIGH (ref 14–54)
AST: 199 U/L — ABNORMAL HIGH (ref 15–41)
Albumin: 2.9 g/dL — ABNORMAL LOW (ref 3.5–5.0)
Alkaline Phosphatase: 364 U/L — ABNORMAL HIGH (ref 38–126)
BILIRUBIN DIRECT: 0.7 mg/dL — AB (ref 0.1–0.5)
BILIRUBIN INDIRECT: 1 mg/dL — AB (ref 0.3–0.9)
BILIRUBIN TOTAL: 1.7 mg/dL — AB (ref 0.3–1.2)
Total Protein: 6.8 g/dL (ref 6.5–8.1)

## 2016-09-13 LAB — HEPATITIS PANEL, ACUTE
HCV Ab: <0.1 s/co ratio (ref 0.0–0.9)
HEP B C IGM: NEGATIVE
HEP B S AG: NEGATIVE
Hep A IgM: NEGATIVE

## 2016-09-13 LAB — HEMOGLOBIN A1C
HEMOGLOBIN A1C: 5.9 % — AB (ref 4.8–5.6)
MEAN PLASMA GLUCOSE: 123 mg/dL

## 2016-09-13 LAB — HIV ANTIBODY (ROUTINE TESTING W REFLEX): HIV Screen 4th Generation wRfx: NONREACTIVE

## 2016-09-13 MED ORDER — POTASSIUM CHLORIDE CRYS ER 20 MEQ PO TBCR: 40.0000 meq | EXTENDED_RELEASE_TABLET | Freq: Once | ORAL | Status: DC

## 2016-09-13 NOTE — Progress Notes (Signed)
[facility] Progress Note  Subjective:  Feels well.  No abdominal pain.  Tolerating diet.  Objective:  Vital signs in last 24 hours: Temp:  [97.5 F (36.4 C)-98.5 F (36.9 C)] 98.4 F (36.9 C) (03/24 0544) Pulse Rate:  [69-86] 73 (03/24 0544) Resp:  [18] 18 (03/24 0544) BP: (96-130)/(50-83) 98/56 (03/24 0544) SpO2:  [99 %-100 %] 99 % (03/24 0544) Last BM Date: 09/10/16 General:  Alert, Well-developed, in NAD Heart:  Regular rate and rhythm; no murmurs Pulm:  CTAB.  No increased WOB. Abdomen:  Soft, non-distended.  BS present.  Non-tender. Extremities:  Without edema. Neurologic:  Alert and oriented x 4;  grossly normal neurologically. Psych:  Alert and cooperative. Normal mood and affect.  Intake/Output from previous day: 03/23 0701 - 03/24 0700 In: 720 [P.O.:120; I.V.:600] Out: 5 [Blood:5]  Lab Results:  Recent Labs  09/10/16 1240 09/11/16 1718 09/12/16 0519  WBC 12.3* 10.9* 13.3*  HGB 10.1* 9.5* 9.6*  HCT 31.1* 28.4* 29.1*  PLT 298.0 339 375   BMET  Recent Labs  09/11/16 1718 09/12/16 0519 09/12/16 1718  NA 137 138 137  K 2.9* 3.7 3.4*  CL 103 106 106  CO2 24 24 24   GLUCOSE 107* 109* 95  BUN 11 11 7   CREATININE 0.96 0.73 0.60  CALCIUM 9.1 9.1 8.6*   LFT  Recent Labs  09/13/16 0526  PROT 6.8  ALBUMIN 2.9*  AST 199*  ALT 358*  ALKPHOS 364*  BILITOT 1.7*  BILIDIR 0.7*  IBILI 1.0*   PT/INR  Recent Labs  09/12/16 0519  LABPROT 13.9  INR 1.06   Hepatitis Panel  Recent Labs  09/12/16 1526  HEPBSAG Negative  HCVAB <0.1  HEPAIGM Negative  HEPBIGM Negative    Ct Abdomen W Contrast  Result Date: 09/12/2016 CLINICAL DATA:  Fever and elevated liver function studies. Evaluate for liver abscess. Endoscopy done earlier today. EXAM: CT ABDOMEN WITH CONTRAST TECHNIQUE: Multidetector CT imaging of the abdomen was performed using the standard protocol following bolus administration of intravenous contrast. CONTRAST:  1  ISOVUE-300 IOPAMIDOL (ISOVUE-300) INJECTION 61% COMPARISON:  MRI 08/07/2016 FINDINGS: Lower chest: The lung bases are clear of acute process. No worrisome pulmonary lesions. The heart is normal in size. No pericardial effusion. The distal esophagus is grossly normal. Hepatobiliary: Mild intrahepatic biliary dilatation but much improved since the prior MRI. Is also a small amount of pneumobilia which is expected after recent ERCP and probable prior sphincterotomy. No focal hepatic lesions to suggest abscess. Probable surgical changes in the gallbladder also with multiple surgical clips. There also may be some type of packing material in the porta hepatis/gallbladder fossa. No common bile duct stent is identified. Mild common bile duct dilatation measuring approximately 10 mm. There appears to be enhancement of the wall of the common bile duct which is also slightly thickened and irregular. This may be due to recent manipulation but could not exclude cholangitis. Pancreas: No pancreatic ductal dilatation. Small amount of air in the duct is not unexpected. Spleen: Normal size.  No focal lesions. Adrenals/Urinary Tract: The adrenal glands and kidneys are unremarkable. Small renal cysts. Stomach/Bowel: The stomach, duodenum, small bowel and colon are unremarkable. Vascular/Lymphatic: Small scattered periportal, celiac axis and gastrohepatic ligament nodes but no mass or adenopathy. The aorta and branch vessels are normal. The major venous structures are patent. Other: No ascites or abdominal wall hernia. Musculoskeletal: No significant bony findings. IMPRESSION: 1. Surgical changes from a prior cholecystectomy. I suspect there  is some type of radiopaque packing material in the gallbladder fossa/porta hepatis region. This also could be contrast in the cystic duct remnant from the recent cholangiogram. 2. Common bile duct dilatation with a slightly thickened irregular enhancing wall. This may be due to recent manipulation  or irritation from prior stent. Cholangitis would be another possibility. 3. No findings for hepatic abscess or other abscess. Electronically Signed   By: Marijo Sanes M.D.   On: 09/12/2016 17:01   Dg Ercp  Result Date: 09/12/2016 CLINICAL DATA:  Bile duct stone EXAM: ERCP TECHNIQUE: Multiple spot images obtained with the fluoroscopic device and submitted for interpretation post-procedure. FLUOROSCOPY TIME:  7 minutes 42 seconds COMPARISON:  09/03/2016 FINDINGS: A series of fluoroscopic spot images document endoscopic cannulation and opacification of the CBD. Subsequent images document endoscopic CBD intervention. There is progressive opacification of the intrahepatic biliary tree which appears mildly distended or dilated. No discrete lesion or persistent filling defect identified. No extravasation. Balloon passage through the common duct documented. IMPRESSION: CBD endoscopic cannulation and intervention as above. These images were submitted for radiologic interpretation only. Please see the procedural report for the amount of contrast and the fluoroscopy time utilized. Electronically Signed   By: Lucrezia Europe M.D.   On: 09/12/2016 14:35   Assessment / Plan: -51 year old female with elevated LFT's who has had recent CBD stones requiring ERCP with extraction and stents.  Underwent ERCP again on 3/23 with a normal biliary tree found.  Viral hepatitis studies ordered and are negative.  CT scan of the abdomen shows changes c/w recent ERCP/manipulation, but no hepatic abscess, etc.  Autoimmune hepatitis markers still pending.  LFT's down very slightly from yesterday.  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 -Leukocytosis:  ? Reactive.  Is no longer on abx, but I do not think that we need them with no source of infection identified.  She is afebrile.  *She can be discharged home today.  Our office will contact her to arrange follow-up.  Will have repeat LFT's in about one week.     LOS: 2  days   ZEHR, JESSICA D.  09/13/2016, 9:01 AM Pager number [phone]  GI ATTENDING  Interval history data reviewed. Patient seen and examined as outlined above. Agree with above progress note as outlined. CT negative for abscess or other significant abnormality. Serologies and square laboratory testing for abnormal liver test pending. No biliary obstruction by ERCP. She does not need antibiotics. Okay for discharge home. We will arrange follow-up liver test in about one week and follow-up with Dr. Loletha Carrow or his associate. They have been instructed to contact the office if there are interval questions or problems.  Docia Chuck. Geri Seminole., M.D. [facility] Division of Gastroenterology

## 2016-09-13 NOTE — Discharge Summary (Signed)
Physician Discharge Summary  Chelsea Cannon EOF:[id number] DOB: 05-Jul-1965 DOA: 09/11/2016  PCP: Lindie Spruce, MD  Admit date: 09/11/2016 Discharge date: 09/13/2016  Admitted From: home Disposition:  home   Recommendations for Outpatient Follow-up:  1. GI to f/u LFTs  Discharge Condition:  stable   CODE STATUS:  Full code   Diet recommendation:  Heart heatlhy, low sodium Consultations:  GI-     Discharge Diagnoses:  Principal Problem:   Elevated LFTs Active Problems:   Anemia   Hyperglycemia   Hypokalemia    Subjective: Still asymptomatic.   HPI: Chelsea Masella Christleyis a 51 y.o.femalewith medical history significant of retained gallstones s/p cholecystectomy who was sent by GI due to increasing LFTs. Had cholecystectomy in 10/17. Had retained stones in CBD - ERCP and stent placed in February. March 14, repeat ERCP showed that the stone was disintegrated and the stent was removed. Yesterday, she went for bloodwork and today they called and said that her LFTs were doubling and they encouraged her to come to the ER to further assessment. She has had ongoing fatigue, weakness, mild epigastric TTP. She was actually feeling better finally last night and this AM prior to the call encouraging her to come in.  Hospital Course:  Principal Problem:   Elevated LFTs - s/p ERCP 3/14 finding a retained stone- sent to hospital for recurrently elevated LFTs by GI - T bili is improving since- see labs below - ERCP done 3/23 shows patent CBD - GI has ordered a CT abd and hepatic function panel which are unrevealing - ok to d/c home- GI will follow as oupt  Active Problems:   Anemia - chronic     Hyperglycemia - last A1c last month was 5.4    Hypokalemia -  replaced  Discharge Instructions  Discharge Instructions    Diet - low sodium heart healthy    Complete by:  As directed    Increase activity slowly    Complete by:  As directed      Allergies as of  09/13/2016   No Known Allergies     Medication List    STOP taking these medications   ciprofloxacin 500 MG tablet Commonly known as:  CIPRO     TAKE these medications   ondansetron 4 MG tablet Commonly known as:  ZOFRAN Take 4 mg by mouth every 6 (six) hours as needed for nausea or vomiting.   PEPCID AC 10 MG tablet Generic drug:  famotidine Take 10 mg by mouth daily as needed for heartburn or indigestion.   traMADol 50 MG tablet Commonly known as:  ULTRAM Take 1 tablet (50 mg total) by mouth every 6 (six) hours as needed for severe pain.       No Known Allergies   Procedures/Studies: ERCP:  1. Normal biliary tree post cholecystectomy                           2. Prior sphincterotomy                           3. Status post cholecystectomy                           4. Elevated liver tests of uncertain etiology. He  had fever 5 days ago and was given antibiotic.                            Question drug reaction. Rule out intrahepatic                            abscess. Rule out viral hepatitis (unlikely).  Dg Chest 2 View  Result Date: 09/03/2016 CLINICAL DATA:  ERCP today with difficulty intubation. Shortness of breath and wheezing upon awakening. Nonsmoker. EXAM: CHEST  2 VIEW COMPARISON:  03/20/2016 FINDINGS: Normal heart size and pulmonary vascularity. No focal airspace disease or consolidation in the lungs. No blunting of costophrenic angles. No pneumothorax. Mediastinal contours appear intact. Surgical clips in the right upper quadrant. IMPRESSION: No active cardiopulmonary disease. Electronically Signed   By: Lucienne Capers M.D.   On: 09/03/2016 21:41   Ct Abdomen W Contrast  Result Date: 09/12/2016 CLINICAL DATA:  Fever and elevated liver function studies. Evaluate for liver abscess. Endoscopy done earlier today. EXAM: CT ABDOMEN WITH CONTRAST TECHNIQUE: Multidetector CT imaging of the abdomen was performed using the standard protocol  following bolus administration of intravenous contrast. CONTRAST:  1 ISOVUE-300 IOPAMIDOL (ISOVUE-300) INJECTION 61% COMPARISON:  MRI 08/07/2016 FINDINGS: Lower chest: The lung bases are clear of acute process. No worrisome pulmonary lesions. The heart is normal in size. No pericardial effusion. The distal esophagus is grossly normal. Hepatobiliary: Mild intrahepatic biliary dilatation but much improved since the prior MRI. Is also a small amount of pneumobilia which is expected after recent ERCP and probable prior sphincterotomy. No focal hepatic lesions to suggest abscess. Probable surgical changes in the gallbladder also with multiple surgical clips. There also may be some type of packing material in the porta hepatis/gallbladder fossa. No common bile duct stent is identified. Mild common bile duct dilatation measuring approximately 10 mm. There appears to be enhancement of the wall of the common bile duct which is also slightly thickened and irregular. This may be due to recent manipulation but could not exclude cholangitis. Pancreas: No pancreatic ductal dilatation. Small amount of air in the duct is not unexpected. Spleen: Normal size.  No focal lesions. Adrenals/Urinary Tract: The adrenal glands and kidneys are unremarkable. Small renal cysts. Stomach/Bowel: The stomach, duodenum, small bowel and colon are unremarkable. Vascular/Lymphatic: Small scattered periportal, celiac axis and gastrohepatic ligament nodes but no mass or adenopathy. The aorta and branch vessels are normal. The major venous structures are patent. Other: No ascites or abdominal wall hernia. Musculoskeletal: No significant bony findings. IMPRESSION: 1. Surgical changes from a prior cholecystectomy. I suspect there is some type of radiopaque packing material in the gallbladder fossa/porta hepatis region. This also could be contrast in the cystic duct remnant from the recent cholangiogram. 2. Common bile duct dilatation with a slightly  thickened irregular enhancing wall. This may be due to recent manipulation or irritation from prior stent. Cholangitis would be another possibility. 3. No findings for hepatic abscess or other abscess. Electronically Signed   By: Marijo Sanes M.D.   On: 09/12/2016 17:01   Dg Ercp  Result Date: 09/12/2016 CLINICAL DATA:  Bile duct stone EXAM: ERCP TECHNIQUE: Multiple spot images obtained with the fluoroscopic device and submitted for interpretation post-procedure. FLUOROSCOPY TIME:  7 minutes 42 seconds COMPARISON:  09/03/2016 FINDINGS: A series of fluoroscopic spot images document endoscopic cannulation and opacification of the CBD. Subsequent images document endoscopic CBD intervention.  There is progressive opacification of the intrahepatic biliary tree which appears mildly distended or dilated. No discrete lesion or persistent filling defect identified. No extravasation. Balloon passage through the common duct documented. IMPRESSION: CBD endoscopic cannulation and intervention as above. These images were submitted for radiologic interpretation only. Please see the procedural report for the amount of contrast and the fluoroscopy time utilized. Electronically Signed   By: Lucrezia Europe M.D.   On: 09/12/2016 14:35   Dg Ercp Biliary & Pancreatic Ducts  Result Date: 09/04/2016 CLINICAL DATA:  Common bile duct stone EXAM: ERCP TECHNIQUE: Multiple spot images obtained with the fluoroscopic device and submitted for interpretation post-procedure. FLUOROSCOPY TIME:  3 minutes 29 seconds 62 mGy COMPARISON:  08/08/2016 and previous FINDINGS: A single fluoroscopic spot image documents endoscopic cannulation and opacification of the CBD with guidewire placement. Cholecystectomy clips. Incomplete opacification of the intrahepatic biliary tree which appears decompressed centrally. IMPRESSION: 1. Endoscopic CBD opacification and intervention. These images were submitted for radiologic interpretation only. Please see the  procedural report for the amount of contrast and the fluoroscopy time utilized. Electronically Signed   By: Lucrezia Europe M.D.   On: 09/04/2016 09:26        Discharge Exam: Vitals:   09/12/16 2331 09/13/16 0544  BP: (!) 96/50 (!) 98/56  Pulse: 69 73  Resp: 18 18  Temp: 98.3 F (36.8 C) 98.4 F (36.9 C)   Vitals:   09/12/16 1428 09/12/16 1523 09/12/16 2331 09/13/16 0544  BP:  125/83 (!) 96/50 (!) 98/56  Pulse:  86 69 73  Resp:  18 18 18   Temp: 97.7 F (36.5 C) 97.5 F (36.4 C) 98.3 F (36.8 C) 98.4 F (36.9 C)  TempSrc:  Oral Oral Oral  SpO2:  100% 99% 99%  Weight:      Height:        General: Pt is alert, awake, not in acute distress Cardiovascular: RRR, S1/S2 +, no rubs, no gallops Respiratory: CTA bilaterally, no wheezing, no rhonchi Abdominal: Soft, NT, ND, bowel sounds + Extremities: no edema, no cyanosis    The results of significant diagnostics from this hospitalization (including imaging, microbiology, ancillary and laboratory) are listed below for reference.     Microbiology: No results found for this or any previous visit (from the past 240 hour(s)).   Labs: BNP (last 3 results) No results for input(s): BNP in the last 8760 hours. Basic Metabolic Panel:  Recent Labs Lab 09/10/16 1240 09/11/16 1718 09/12/16 0519 09/12/16 1718  NA 133* 137 138 137  K 3.3* 2.9* 3.7 3.4*  CL 98 103 106 106  CO2 26 24 24 24   GLUCOSE 143* 107* 109* 95  BUN 9 11 11 7   CREATININE 0.72 0.96 0.73 0.60  CALCIUM 9.4 9.1 9.1 8.6*   Liver Function Tests:  Recent Labs Lab 09/10/16 1240 09/11/16 1718 09/12/16 0519 09/12/16 1718 09/13/16 0526  AST 166* 132* 184* 181* 199*  ALT 558* 431* 406* 352* 358*  ALKPHOS 499* 514* 466* 386* 364*  BILITOT 5.7* 3.0* 2.2* 2.0* 1.7*  PROT 7.4 8.0 7.6 7.1 6.8  ALBUMIN 3.7 3.4* 3.1* 3.0* 2.9*   No results for input(s): LIPASE, AMYLASE in the last 168 hours. No results for input(s): AMMONIA in the last 168 hours. CBC:  Recent  Labs Lab 09/10/16 1240 09/11/16 1718 09/12/16 0519  WBC 12.3* 10.9* 13.3*  NEUTROABS 8.9* 8.1*  --   HGB 10.1* 9.5* 9.6*  HCT 31.1* 28.4* 29.1*  MCV 78.5 75.7* 77.6*  PLT 298.0 339 375   Cardiac Enzymes: No results for input(s): CKTOTAL, CKMB, CKMBINDEX, TROPONINI in the last 168 hours. BNP: Invalid input(s): POCBNP CBG: No results for input(s): GLUCAP in the last 168 hours. D-Dimer No results for input(s): DDIMER in the last 72 hours. Hgb A1c  Recent Labs  09/12/16 0248  HGBA1C 5.9*   Lipid Profile No results for input(s): CHOL, HDL, LDLCALC, TRIG, CHOLHDL, LDLDIRECT in the last 72 hours. Thyroid function studies No results for input(s): TSH, T4TOTAL, T3FREE, THYROIDAB in the last 72 hours.  Invalid input(s): FREET3 Anemia work up  Recent Labs  09/10/16 1240  VITAMINB12 1,403*  FERRITIN 27.3   Urinalysis No results found for: COLORURINE, APPEARANCEUR, LABSPEC, [place], GLUCOSEU, HGBUR, BILIRUBINUR, KETONESUR, PROTEINUR, UROBILINOGEN, NITRITE, LEUKOCYTESUR Sepsis Labs Invalid input(s): PROCALCITONIN,  WBC,  LACTICIDVEN Microbiology No results found for this or any previous visit (from the past 240 hour(s)).   Time coordinating discharge: Over 30 minutes  SIGNED:   Debbe Odea, MD  [facility] 09/13/2016, 11:30 AM Pager   If 7PM-7AM, please contact night-coverage [email or web address] Password [email or web address]

## 2016-09-13 NOTE — Progress Notes (Signed)
Pt discharged to home. DC instructions given. No concerns voiced. Pt encouraged to make f/u appt. Voiced understanding. Left unit in wheel chair pushed by nurse tech. Left in good condition. VWilliams,rn.

## 2016-09-14 ENCOUNTER — Encounter (HOSPITAL_COMMUNITY): Payer: Self-pay | Admitting: Internal Medicine

## 2016-09-14 LAB — MITOCHONDRIAL ANTIBODIES: MITOCHONDRIAL M2 AB, IGG: 7.3 U (ref 0.0–20.0)

## 2016-09-14 LAB — ANTI-SMOOTH MUSCLE ANTIBODY, IGG: F-Actin IgG: 21 Units — ABNORMAL HIGH (ref 0–19)

## 2016-09-15 ENCOUNTER — Other Ambulatory Visit: Payer: Self-pay

## 2016-09-15 DIAGNOSIS — R7989 Other specified abnormal findings of blood chemistry: Secondary | ICD-10-CM

## 2016-09-15 DIAGNOSIS — R945 Abnormal results of liver function studies: Principal | ICD-10-CM

## 2016-09-15 LAB — ANTINUCLEAR ANTIBODIES, IFA: ANTINUCLEAR ANTIBODIES, IFA: NEGATIVE

## 2016-09-16 ENCOUNTER — Other Ambulatory Visit: Payer: Self-pay

## 2016-09-16 ENCOUNTER — Telehealth: Payer: Self-pay | Admitting: Gastroenterology

## 2016-09-16 NOTE — Telephone Encounter (Signed)
Patient advised to come back in for further lab work. I told patient as soon as I know when a follow up visit needs to be scheduled, I would call her back to schedule office visit.

## 2016-09-16 NOTE — Telephone Encounter (Signed)
This is a Dr Chelsea Cannon pt.

## 2016-09-17 ENCOUNTER — Telehealth: Payer: Self-pay

## 2016-09-17 ENCOUNTER — Other Ambulatory Visit (INDEPENDENT_AMBULATORY_CARE_PROVIDER_SITE_OTHER): Payer: Self-pay

## 2016-09-17 DIAGNOSIS — R7989 Other specified abnormal findings of blood chemistry: Secondary | ICD-10-CM

## 2016-09-17 DIAGNOSIS — R945 Abnormal results of liver function studies: Principal | ICD-10-CM

## 2016-09-17 LAB — HEPATIC FUNCTION PANEL
ALBUMIN: 4.2 g/dL (ref 3.5–5.2)
ALT: 201 U/L — ABNORMAL HIGH (ref 0–35)
AST: 67 U/L — ABNORMAL HIGH (ref 0–37)
Alkaline Phosphatase: 310 U/L — ABNORMAL HIGH (ref 39–117)
Bilirubin, Direct: 0.7 mg/dL — ABNORMAL HIGH (ref 0.0–0.3)
Total Bilirubin: 1.4 mg/dL — ABNORMAL HIGH (ref 0.2–1.2)
Total Protein: 8.2 g/dL (ref 6.0–8.3)

## 2016-09-17 NOTE — Telephone Encounter (Signed)
Patient will get labs today and is aware of follow up appointment with Dr. Loletha Carrow on 3/29 at 9:45.

## 2016-09-17 NOTE — Telephone Encounter (Signed)
-----   Message from Loralie Champagne, PA-C sent at 09/13/2016  9:43 AM EDT ----- Patient is being discharged from hospital 3/24.  Autoimmune hepatitis studies are pending.  Those will come back to me and I will forward to Dr. Loletha Carrow.  Should probably have repeat LFT's sometime later this week as well.  Please arrange for follow-up for her.  Thank you,  Jess

## 2016-09-18 ENCOUNTER — Ambulatory Visit (INDEPENDENT_AMBULATORY_CARE_PROVIDER_SITE_OTHER): Payer: Self-pay | Admitting: Gastroenterology

## 2016-09-18 ENCOUNTER — Encounter: Payer: Self-pay | Admitting: Gastroenterology

## 2016-09-18 VITALS — BP 110/80 | HR 120 | Ht 64.27 in | Wt 146.2 lb

## 2016-09-18 DIAGNOSIS — K8050 Calculus of bile duct without cholangitis or cholecystitis without obstruction: Secondary | ICD-10-CM

## 2016-09-18 DIAGNOSIS — R945 Abnormal results of liver function studies: Principal | ICD-10-CM

## 2016-09-18 DIAGNOSIS — R7989 Other specified abnormal findings of blood chemistry: Secondary | ICD-10-CM

## 2016-09-18 DIAGNOSIS — R5383 Other fatigue: Secondary | ICD-10-CM

## 2016-09-18 NOTE — Patient Instructions (Signed)
If you are age 51 or older, your body mass index should be between 23-30. Your Body mass index is 24.89 kg/m. If this is out of the aforementioned range listed, please consider follow up with your Primary Care Provider.  If you are age 41 or younger, your body mass index should be between 19-25. Your Body mass index is 24.89 kg/m. If this is out of the aformentioned range listed, please consider follow up with your Primary Care Provider.   Your physician has requested that you go to the basement for lab work one day next week.  Thank you for choosing [facility]  Dr Wilfrid Lund III

## 2016-09-18 NOTE — Progress Notes (Signed)
[facility] Progress Note  Chief Complaint: Abnormal LFTs  Subjective  History:  Chelsea Cannon was seen after hospitalization last week. She had a bile duct stone in February 2018, could not be removed and a stent was placed. She then had outpatient ERCP with me 2 weeks ago, stent was removed and a cholangioscopy performed that confirmed no stone in the bile duct. Her LFTs were still elevated after that, she was discharged home after antibiotics. Several days later she apparently had a fever and call Dr. Benson Norway Sunday. It seems that he prescribed ciprofloxacin. A few days later when we rechecked her outpatient LFTs they were going up and she was feeling quite fatigued with some right upper quadrant pain. She was readmitted: Dr. Henrene Pastor performed a third ERCP late last week. Good quality images were obtained, and confirmed no stone in the biliary tree. All antibiotics were stopped, she was discharged home later that weekend. She is here with 2 family members today. [name] reports feeling fatigued and some intermittent brief right upper quadrant pain. She has had no fevers, she is no longer jaundice.  ROS: Cardiovascular:  no chest pain Respiratory: no dyspnea  The patient's Past Medical, Family and Social History were reviewed and are on file in the EMR.  Objective:  Med list reviewed  Vital signs in last 24 hrs: Vitals:   09/18/16 0928  BP: 110/80  Pulse: (!) 120    Physical Exam    HEENT: sclera anicteric, oral mucosa moist without lesions  Neck: supple, no thyromegaly, JVD or lymphadenopathy  Cardiac: RRR without murmurs, S1S2 heard, no peripheral edema  Pulm: clear to auscultation bilaterally, normal RR and effort noted  Abdomen: soft, No tenderness, with active bowel sounds. No guarding or palpable hepatosplenomegaly.  Skin; warm and dry, no jaundice or rash  Recent Labs: CMP Latest Ref Rng & Units 09/17/2016 09/13/2016 09/12/2016  Glucose 65 - 99 mg/dL - - 95  BUN 6  - 20 mg/dL - - 7  Creatinine 0.44 - 1.00 mg/dL - - 0.60  Sodium 135 - 145 mmol/L - - 137  Potassium 3.5 - 5.1 mmol/L - - 3.4(L)  Chloride 101 - 111 mmol/L - - 106  CO2 22 - 32 mmol/L - - 24  Calcium 8.9 - 10.3 mg/dL - - 8.6(L)  Total Protein 6.0 - 8.3 g/dL 8.2 6.8 7.1  Total Bilirubin 0.2 - 1.2 mg/dL 1.4(H) 1.7(H) 2.0(H)  Alkaline Phos 39 - 117 U/L 310(H) 364(H) 386(H)  AST 0 - 37 U/L 67(H) 199(H) 181(H)  ALT 0 - 35 U/L 201(H) 358(H) 352(H)    ANA negative (and it had been a month prior as well) Smooth muscle antibody 21 (slightly over normal), it had been 11 one month prior.  AMA normal at 7.3 Radiologic studies:  See ERCP images. They were personally reviewed  She also had a follow-up CT scan abdomen and pelvis to make sure there was no hepatic abscess or space-occupying lesion. All that was found was some increased radiodensity material in the porta hepatis, which was felt to be retained ERCP contrast in the biliary tree.  @ASSESSMENTPLANBEGIN @ Assessment: Encounter Diagnoses  Name Primary?  . Elevated LFTs Yes  . Common bile duct stone   . Fatigue, unspecified type   I believe she had a common bile duct stone on my first ERCP, that he truly was gone by the second ERCP, but that she probably got drug-induced liver injury from ciprofloxacin. That medicine was given during her  first 2 ERCPs as well as the weekend after her discharge from the second ERCP. She is feeling steadily better at this point, and I do not believe she has autoimmune hepatitis.  Plan:  We have listed ciprofloxacin as an allergy in her chart Check LFTs weekly for the next 3 or 4 weeks  Total time  25 minutes, over half spent in counseling and coordination of care.   Nelida Meuse III

## 2016-09-24 ENCOUNTER — Other Ambulatory Visit (INDEPENDENT_AMBULATORY_CARE_PROVIDER_SITE_OTHER): Payer: Self-pay

## 2016-09-24 DIAGNOSIS — R7989 Other specified abnormal findings of blood chemistry: Secondary | ICD-10-CM

## 2016-09-24 DIAGNOSIS — R945 Abnormal results of liver function studies: Principal | ICD-10-CM

## 2016-09-24 DIAGNOSIS — K8050 Calculus of bile duct without cholangitis or cholecystitis without obstruction: Secondary | ICD-10-CM

## 2016-09-24 DIAGNOSIS — R5383 Other fatigue: Secondary | ICD-10-CM

## 2016-09-24 LAB — HEPATIC FUNCTION PANEL
ALT: 58 U/L — ABNORMAL HIGH (ref 0–35)
AST: 28 U/L (ref 0–37)
Albumin: 4 g/dL (ref 3.5–5.2)
Alkaline Phosphatase: 198 U/L — ABNORMAL HIGH (ref 39–117)
BILIRUBIN DIRECT: 0.4 mg/dL — AB (ref 0.0–0.3)
BILIRUBIN TOTAL: 1 mg/dL (ref 0.2–1.2)
Total Protein: 7.5 g/dL (ref 6.0–8.3)

## 2016-09-29 ENCOUNTER — Other Ambulatory Visit: Payer: Self-pay

## 2016-09-29 DIAGNOSIS — R945 Abnormal results of liver function studies: Secondary | ICD-10-CM

## 2016-09-29 DIAGNOSIS — R7989 Other specified abnormal findings of blood chemistry: Secondary | ICD-10-CM

## 2016-10-01 ENCOUNTER — Other Ambulatory Visit: Payer: Self-pay

## 2016-10-01 ENCOUNTER — Other Ambulatory Visit (INDEPENDENT_AMBULATORY_CARE_PROVIDER_SITE_OTHER): Payer: Self-pay

## 2016-10-01 DIAGNOSIS — R7989 Other specified abnormal findings of blood chemistry: Secondary | ICD-10-CM

## 2016-10-01 DIAGNOSIS — R945 Abnormal results of liver function studies: Secondary | ICD-10-CM

## 2016-10-01 LAB — HEPATIC FUNCTION PANEL
ALK PHOS: 166 U/L — AB (ref 39–117)
ALT: 35 U/L (ref 0–35)
AST: 23 U/L (ref 0–37)
Albumin: 4.1 g/dL (ref 3.5–5.2)
Bilirubin, Direct: 0.3 mg/dL (ref 0.0–0.3)
TOTAL PROTEIN: 7.6 g/dL (ref 6.0–8.3)
Total Bilirubin: 0.8 mg/dL (ref 0.2–1.2)

## 2016-10-15 ENCOUNTER — Other Ambulatory Visit (INDEPENDENT_AMBULATORY_CARE_PROVIDER_SITE_OTHER): Payer: Self-pay

## 2016-10-15 DIAGNOSIS — R7989 Other specified abnormal findings of blood chemistry: Secondary | ICD-10-CM

## 2016-10-15 DIAGNOSIS — R945 Abnormal results of liver function studies: Principal | ICD-10-CM

## 2016-10-15 LAB — HEPATIC FUNCTION PANEL
ALBUMIN: 4.3 g/dL (ref 3.5–5.2)
ALT: 25 U/L (ref 0–35)
AST: 20 U/L (ref 0–37)
Alkaline Phosphatase: 134 U/L — ABNORMAL HIGH (ref 39–117)
Bilirubin, Direct: 0.2 mg/dL (ref 0.0–0.3)
TOTAL PROTEIN: 7.9 g/dL (ref 6.0–8.3)
Total Bilirubin: 0.6 mg/dL (ref 0.2–1.2)

## 2016-11-22 NOTE — Addendum Note (Signed)
Addendum  created 11/22/16 1027 by Steven Basso, MD   Sign clinical note    

## 2016-11-24 NOTE — Addendum Note (Signed)
Addendum  created 11/24/16 1222 by Myrtie Soman, MD   Sign clinical note

## 2017-08-03 ENCOUNTER — Ambulatory Visit: Payer: Self-pay | Admitting: Obstetrics and Gynecology

## 2017-08-12 ENCOUNTER — Ambulatory Visit (INDEPENDENT_AMBULATORY_CARE_PROVIDER_SITE_OTHER): Payer: Self-pay | Admitting: Obstetrics and Gynecology

## 2017-08-12 ENCOUNTER — Encounter: Payer: Self-pay | Admitting: Obstetrics and Gynecology

## 2017-08-12 ENCOUNTER — Encounter (HOSPITAL_COMMUNITY): Payer: Self-pay

## 2017-08-12 DIAGNOSIS — N841 Polyp of cervix uteri: Secondary | ICD-10-CM | POA: Insufficient documentation

## 2017-08-12 DIAGNOSIS — N840 Polyp of corpus uteri: Secondary | ICD-10-CM

## 2017-08-12 NOTE — Progress Notes (Signed)
Chelsea Cannon presents with concerns of possible prolapse. Pt reports tissue protruding from her vaginal for the last year. Was become more pronounced over the last few months. Has noted some bleeding at times. Cycles have been heavy and irregular as well. Some urinary urgent Sx. Denies any frank stress incont Sx. Some mild constipation at times. Sexual active without problems Last pap smear over 15 yrs a go. Never had a mammogram.  ALLs: Cipro  PMH: Denies  PSH: GB and ERCP  PGYN: no STD's or abnormal pap smears  POB: TSVD x 4 , SAB x 1           Last child DS  SH: Denies habits  PE AF VSS Lungs clear Heart RRR Breast sym supple no masses or adenopathy Abd soft + BS GU  Nl EGBUS prolapsing endometrial polyp approx 8 cm x 1 cm, cervix no visible lesions  A/P Prolapsing endometrial polyp  Pt provided information for mammogram scholarship d/t no insurance. Financial aid application provided as well. Referred to [facility] program for pap smear as well. Pap smear after surgery, however. Endometrial polyp reviewed with pt and family ( mother and sister). Recommended removal vis hysteroscopy. D & C as well. R/B/Post op care reviewed with pt. Will schedule. F/U with post op appt.

## 2017-08-12 NOTE — Patient Instructions (Signed)
Hysteroscopy  Hysteroscopy is a procedure used for looking inside the womb (uterus). It may be done for various reasons, including:  · To evaluate abnormal bleeding, fibroid (benign, noncancerous) tumors, polyps, scar tissue (adhesions), and possibly cancer of the uterus.  · To look for lumps (tumors) and other uterine growths.  · To look for causes of why a woman cannot get pregnant (infertility), causes of recurrent loss of pregnancy (miscarriages), or a lost intrauterine device (IUD).  · To perform a sterilization by blocking the fallopian tubes from inside the uterus.    In this procedure, a thin, flexible tube with a tiny light and camera on the end of it (hysteroscope) is used to look inside the uterus. A hysteroscopy should be done right after a menstrual period to be sure you are not pregnant.  LET YOUR HEALTH CARE PROVIDER KNOW ABOUT:  · Any allergies you have.  · All medicines you are taking, including vitamins, herbs, eye drops, creams, and over-the-counter medicines.  · Previous problems you or members of your family have had with the use of anesthetics.  · Any blood disorders you have.  · Previous surgeries you have had.  · Medical conditions you have.  RISKS AND COMPLICATIONS  Generally, this is a safe procedure. However, as with any procedure, complications can occur. Possible complications include:  · Putting a hole in the uterus.  · Excessive bleeding.  · Infection.  · Damage to the cervix.  · Injury to other organs.  · Allergic reaction to medicines.  · Too much fluid used in the uterus for the procedure.    BEFORE THE PROCEDURE  · Ask your health care provider about changing or stopping any regular medicines.  · Do not take aspirin or blood thinners for 1 week before the procedure, or as directed by your health care provider. These can cause bleeding.  · If you smoke, do not smoke for 2 weeks before the procedure.  · In some cases, a medicine is placed in the cervix the day before the procedure.  This medicine makes the cervix have a larger opening (dilate). This makes it easier for the instrument to be inserted into the uterus during the procedure.  · Do not eat or drink anything for at least 8 hours before the surgery.  · Arrange for someone to take you home after the procedure.  PROCEDURE  · You may be given a medicine to relax you (sedative). You may also be given one of the following:  ? A medicine that numbs the area around the cervix (local anesthetic).  ? A medicine that makes you sleep through the procedure (general anesthetic).  · The hysteroscope is inserted through the vagina into the uterus. The camera on the hysteroscope sends a picture to a TV screen. This gives the surgeon a good view inside the uterus.  · During the procedure, air or a liquid is put into the uterus, which allows the surgeon to see better.  · Sometimes, tissue is gently scraped from inside the uterus. These tissue samples are sent to a lab for testing.  What to expect after the procedure  · If you had a general anesthetic, you may be groggy for a couple hours after the procedure.  · If you had a local anesthetic, you will be able to go home as soon as you are stable and feel ready.  · You may have some cramping. This normally lasts for a couple days.  · You may   have bleeding, which varies from light spotting for a few days to menstrual-like bleeding for 3-7 days. This is normal.  · If your test results are not back during the visit, make an appointment with your health care provider to find out the results.  This information is not intended to replace advice given to you by your health care provider. Make sure you discuss any questions you have with your health care provider.  Document Released: 09/15/2000 Document Revised: 11/15/2015 Document Reviewed: 01/06/2013  Elsevier Interactive Patient Education © 2017 Elsevier Inc.

## 2017-08-26 ENCOUNTER — Other Ambulatory Visit: Payer: Self-pay

## 2017-08-26 ENCOUNTER — Encounter (HOSPITAL_COMMUNITY): Payer: Self-pay | Admitting: *Deleted

## 2017-09-04 ENCOUNTER — Encounter (HOSPITAL_COMMUNITY): Payer: Self-pay

## 2017-09-30 NOTE — Anesthesia Preprocedure Evaluation (Addendum)
Anesthesia Evaluation  Patient identified by MRN, date of birth, ID band Patient awake    Reviewed: Allergy & Precautions, NPO status , Patient's Chart, lab work & pertinent test results  Airway Mallampati: II  TM Distance: >3 FB Neck ROM: Full    Dental  (+) Chipped, Teeth Intact,    Pulmonary neg pulmonary ROS,    Pulmonary exam normal breath sounds clear to auscultation       Cardiovascular Exercise Tolerance: Good negative cardio ROS Normal cardiovascular exam Rhythm:Regular Rate:Normal     Neuro/Psych negative neurological ROS  negative psych ROS   GI/Hepatic negative GI ROS, Neg liver ROS, GERD  ,  Endo/Other  negative endocrine ROS  Renal/GU negative Renal ROS  negative genitourinary   Musculoskeletal negative musculoskeletal ROS (+)   Abdominal   Peds negative pediatric ROS (+)  Hematology  (+) anemia ,   Anesthesia Other Findings   Reproductive/Obstetrics negative OB ROS                            Lab Results  Component Value Date   WBC 13.3 (H) 09/12/2016   HGB 9.6 (L) 09/12/2016   HCT 29.1 (L) 09/12/2016   MCV 77.6 (L) 09/12/2016   PLT 375 09/12/2016    Anesthesia Physical Anesthesia Plan  ASA: II  Anesthesia Plan: General   Post-op Pain Management:    Induction: Intravenous  PONV Risk Score and Plan: 3 and Treatment may vary due to age or medical condition, Ondansetron, Dexamethasone and Scopolamine patch - Pre-op  Airway Management Planned: LMA  Additional Equipment:   Intra-op Plan:   Post-operative Plan:   Informed Consent: I have reviewed the patients History and Physical, chart, labs and discussed the procedure including the risks, benefits and alternatives for the proposed anesthesia with the patient or authorized representative who has indicated his/her understanding and acceptance.     Plan Discussed with:   Anesthesia Plan Comments:          Anesthesia Quick Evaluation

## 2017-10-01 ENCOUNTER — Encounter (HOSPITAL_COMMUNITY)
Admission: AD | Disposition: A | Discharge status: Home / Self Care | Payer: Self-pay | Source: Ambulatory Visit | Attending: Obstetrics and Gynecology

## 2017-10-01 ENCOUNTER — Ambulatory Visit (HOSPITAL_COMMUNITY): Payer: Medicaid Other | Admitting: Anesthesiology

## 2017-10-01 ENCOUNTER — Ambulatory Visit (HOSPITAL_COMMUNITY)
Admission: AD | Admit: 2017-10-01 | Discharge: 2017-10-01 | Disposition: A | Discharge status: Home / Self Care | Payer: Medicaid Other | Source: Ambulatory Visit | Attending: Obstetrics and Gynecology | Admitting: Obstetrics and Gynecology

## 2017-10-01 ENCOUNTER — Encounter (HOSPITAL_COMMUNITY): Payer: Self-pay | Admitting: *Deleted

## 2017-10-01 ENCOUNTER — Other Ambulatory Visit: Payer: Self-pay

## 2017-10-01 DIAGNOSIS — K219 Gastro-esophageal reflux disease without esophagitis: Secondary | ICD-10-CM | POA: Diagnosis not present

## 2017-10-01 DIAGNOSIS — N841 Polyp of cervix uteri: Secondary | ICD-10-CM | POA: Diagnosis not present

## 2017-10-01 DIAGNOSIS — Z881 Allergy status to other antibiotic agents status: Secondary | ICD-10-CM | POA: Insufficient documentation

## 2017-10-01 DIAGNOSIS — N840 Polyp of corpus uteri: Secondary | ICD-10-CM

## 2017-10-01 DIAGNOSIS — N814 Uterovaginal prolapse, unspecified: Secondary | ICD-10-CM | POA: Diagnosis not present

## 2017-10-01 HISTORY — DX: Other specified personal risk factors, not elsewhere classified: Z91.89

## 2017-10-01 HISTORY — DX: Presence of spectacles and contact lenses: Z97.3

## 2017-10-01 HISTORY — PX: DILATATION & CURETTAGE/HYSTEROSCOPY WITH MYOSURE: SHX6511

## 2017-10-01 HISTORY — DX: Gastro-esophageal reflux disease without esophagitis: K21.9

## 2017-10-01 LAB — CBC
HEMATOCRIT: 36.1 % (ref 36.0–46.0)
HEMOGLOBIN: 11.6 g/dL — AB (ref 12.0–15.0)
MCH: 24.6 pg — AB (ref 26.0–34.0)
MCHC: 32.1 g/dL (ref 30.0–36.0)
MCV: 76.5 fL — AB (ref 78.0–100.0)
PLATELETS: 546 10*3/uL — AB (ref 150–400)
RBC: 4.72 MIL/uL (ref 3.87–5.11)
RDW: 14.8 % (ref 11.5–15.5)
WBC: 11.5 10*3/uL — AB (ref 4.0–10.5)

## 2017-10-01 LAB — PREGNANCY, URINE: Preg Test, Ur: NEGATIVE

## 2017-10-01 SURGERY — DILATATION & CURETTAGE/HYSTEROSCOPY WITH MYOSURE
Anesthesia: General | Site: Vagina

## 2017-10-01 MED ORDER — PROPOFOL 10 MG/ML IV BOLUS
INTRAVENOUS | Status: AC
  Filled 2017-10-01: qty 20

## 2017-10-01 MED ORDER — MIDAZOLAM HCL 2 MG/2ML IJ SOLN
INTRAMUSCULAR | Status: AC
  Filled 2017-10-01: qty 2

## 2017-10-01 MED ORDER — MEPERIDINE HCL 25 MG/ML IJ SOLN: 6.2500 mg | INTRAMUSCULAR | Status: DC | PRN

## 2017-10-01 MED ORDER — ONDANSETRON HCL 4 MG/2ML IJ SOLN
INTRAMUSCULAR | Status: AC
  Filled 2017-10-01: qty 2

## 2017-10-01 MED ORDER — FENTANYL CITRATE (PF) 100 MCG/2ML IJ SOLN
INTRAMUSCULAR | Status: DC | PRN
  Administered 2017-10-01: 100 ug via INTRAVENOUS

## 2017-10-01 MED ORDER — KETOROLAC TROMETHAMINE 30 MG/ML IJ SOLN
INTRAMUSCULAR | Status: AC
Start: 2017-10-01 — End: ?
  Filled 2017-10-01: qty 1

## 2017-10-01 MED ORDER — FENTANYL CITRATE (PF) 100 MCG/2ML IJ SOLN
INTRAMUSCULAR | Status: AC
  Filled 2017-10-01: qty 2

## 2017-10-01 MED ORDER — SCOPOLAMINE 1 MG/3DAYS TD PT72
MEDICATED_PATCH | TRANSDERMAL | Status: AC
  Filled 2017-10-01: qty 1

## 2017-10-01 MED ORDER — LIDOCAINE HCL (CARDIAC) 20 MG/ML IV SOLN
INTRAVENOUS | Status: AC
  Filled 2017-10-01: qty 5

## 2017-10-01 MED ORDER — DEXAMETHASONE SODIUM PHOSPHATE 4 MG/ML IJ SOLN
INTRAMUSCULAR | Status: DC | PRN
  Administered 2017-10-01: 4 mg via INTRAVENOUS

## 2017-10-01 MED ORDER — SCOPOLAMINE 1 MG/3DAYS TD PT72
1.0000 | MEDICATED_PATCH | Freq: Once | TRANSDERMAL | Status: DC
  Administered 2017-10-01: 1.5 mg via TRANSDERMAL

## 2017-10-01 MED ORDER — SOD CITRATE-CITRIC ACID 500-334 MG/5ML PO SOLN
ORAL | Status: AC
  Administered 2017-10-01: 30 mL
  Filled 2017-10-01: qty 15

## 2017-10-01 MED ORDER — ONDANSETRON HCL 4 MG/2ML IJ SOLN
INTRAMUSCULAR | Status: DC | PRN
  Administered 2017-10-01: 4 mg via INTRAVENOUS

## 2017-10-01 MED ORDER — SOD CITRATE-CITRIC ACID 500-334 MG/5ML PO SOLN
30.0000 mL | ORAL | Status: AC
  Administered 2017-10-01: 30 mL via ORAL

## 2017-10-01 MED ORDER — LACTATED RINGERS IV SOLN
INTRAVENOUS | Status: DC
  Administered 2017-10-01: 14:00:00 via INTRAVENOUS

## 2017-10-01 MED ORDER — HYDROMORPHONE HCL 1 MG/ML IJ SOLN
INTRAMUSCULAR | Status: AC
  Filled 2017-10-01: qty 1

## 2017-10-01 MED ORDER — DEXAMETHASONE SODIUM PHOSPHATE 4 MG/ML IJ SOLN
INTRAMUSCULAR | Status: AC
  Filled 2017-10-01: qty 1

## 2017-10-01 MED ORDER — HYDROCODONE-ACETAMINOPHEN 7.5-325 MG PO TABS: 1.0000 | ORAL_TABLET | Freq: Once | ORAL | Status: DC | PRN

## 2017-10-01 MED ORDER — LIDOCAINE HCL (CARDIAC) 20 MG/ML IV SOLN
INTRAVENOUS | Status: DC | PRN
  Administered 2017-10-01: 80 mg via INTRATRACHEAL

## 2017-10-01 MED ORDER — SODIUM CHLORIDE 0.9 % IR SOLN
Status: DC | PRN
  Administered 2017-10-01: 3000 mL

## 2017-10-01 MED ORDER — HYDROCODONE-ACETAMINOPHEN 5-325 MG PO TABS: 1.0000 | ORAL_TABLET | Freq: Four times a day (QID) | ORAL | 0 refills | Status: DC | PRN

## 2017-10-01 MED ORDER — PROPOFOL 10 MG/ML IV BOLUS
INTRAVENOUS | Status: DC | PRN
  Administered 2017-10-01: 180 mg via INTRAVENOUS

## 2017-10-01 MED ORDER — MIDAZOLAM HCL 5 MG/5ML IJ SOLN
INTRAMUSCULAR | Status: DC | PRN
  Administered 2017-10-01: 1 mg via INTRAVENOUS

## 2017-10-01 MED ORDER — KETOROLAC TROMETHAMINE 30 MG/ML IJ SOLN
INTRAMUSCULAR | Status: DC | PRN
  Administered 2017-10-01: 30 mg via INTRAVENOUS

## 2017-10-01 MED ORDER — ACETAMINOPHEN 10 MG/ML IV SOLN: 1000.0000 mg | Freq: Once | INTRAVENOUS | Status: DC | PRN

## 2017-10-01 MED ORDER — PROMETHAZINE HCL 25 MG/ML IJ SOLN: 6.2500 mg | INTRAMUSCULAR | Status: DC | PRN

## 2017-10-01 MED ORDER — HYDROMORPHONE HCL 1 MG/ML IJ SOLN
0.2500 mg | INTRAMUSCULAR | Status: DC | PRN
  Administered 2017-10-01 (×2): 0.5 mg via INTRAVENOUS

## 2017-10-01 MED ORDER — IBUPROFEN 800 MG PO TABS: 800.0000 mg | ORAL_TABLET | Freq: Three times a day (TID) | ORAL | 0 refills | Status: DC | PRN

## 2017-10-01 SURGICAL SUPPLY — 17 items
CATH ROBINSON RED A/P 16FR (CATHETERS) ×3 IMPLANT
DEVICE MYOSURE LITE (MISCELLANEOUS) IMPLANT
DEVICE MYOSURE REACH (MISCELLANEOUS) IMPLANT
FILTER ARTHROSCOPY CONVERTOR (FILTER) ×3 IMPLANT
GLOVE BIO SURGEON STRL SZ7.5 (GLOVE) ×3 IMPLANT
GLOVE BIOGEL PI IND STRL 7.0 (GLOVE) ×1 IMPLANT
GLOVE BIOGEL PI INDICATOR 7.0 (GLOVE) ×2
GOWN STRL REUS W/ TWL XL LVL3 (GOWN DISPOSABLE) ×1 IMPLANT
GOWN STRL REUS W/TWL LRG LVL3 (GOWN DISPOSABLE) ×3 IMPLANT
GOWN STRL REUS W/TWL XL LVL3 (GOWN DISPOSABLE) ×3
PACK VAGINAL MINOR WOMEN LF (CUSTOM PROCEDURE TRAY) ×3 IMPLANT
PAD OB MATERNITY 4.3X12.25 (PERSONAL CARE ITEMS) ×3 IMPLANT
PENCIL BUTTON BLDE SNGL 10FT (ELECTRODE) ×2 IMPLANT
SEAL ROD LENS SCOPE MYOSURE (ABLATOR) ×3 IMPLANT
TOWEL OR 17X24 6PK STRL BLUE (TOWEL DISPOSABLE) ×6 IMPLANT
TUBING AQUILEX INFLOW (TUBING) ×3 IMPLANT
TUBING AQUILEX OUTFLOW (TUBING) ×3 IMPLANT

## 2017-10-01 NOTE — Anesthesia Postprocedure Evaluation (Signed)
Anesthesia Post Note  Patient: Chelsea Cannon  Procedure(s) Performed: DILATATION & CURETTAGE/HYSTEROSCOPY with ENDOCERVICAL POLYPECTOMY (N/A Vagina )     Patient location during evaluation: PACU Anesthesia Type: General Level of consciousness: awake and alert Pain management: pain level controlled Vital Signs Assessment: post-procedure vital signs reviewed and stable Respiratory status: spontaneous breathing, nonlabored ventilation, respiratory function stable and patient connected to nasal cannula oxygen Cardiovascular status: blood pressure returned to baseline and stable Postop Assessment: no apparent nausea or vomiting Anesthetic complications: no    Last Vitals:  Vitals:   10/01/17 1600 10/01/17 1615  BP: 113/68 105/70  Pulse: 91 86  Resp: 18 15  Temp:    SpO2: 99% 99%    Last Pain:  Vitals:   10/01/17 1615  TempSrc:   PainSc: 3    Pain Goal: Patients Stated Pain Goal: 3 (10/01/17 1615)               Barnet Glasgow

## 2017-10-01 NOTE — Transfer of Care (Signed)
Immediate Anesthesia Transfer of Care Note  Patient: Chelsea Cannon  Procedure(s) Performed: DILATATION & CURETTAGE/HYSTEROSCOPY with ENDOCERVICAL POLYPECTOMY (N/A Vagina )  Patient Location: PACU  Anesthesia Type:General  Level of Consciousness: awake, alert  and oriented  Airway & Oxygen Therapy: Patient Spontanous Breathing and Patient connected to nasal cannula oxygen  Post-op Assessment: Report given to RN and Post -op Vital signs reviewed and stable  Post vital signs: Reviewed and stable HR 90, RR SaO2 100%, BP 119/73  Last Vitals:  Vitals Value Taken Time  BP 119/73 10/01/2017  3:45 PM  Temp    Pulse 88 10/01/2017  3:50 PM  Resp 18 10/01/2017  3:50 PM  SpO2 99 % 10/01/2017  3:50 PM  Vitals shown include unvalidated device data.  Last Pain:  Vitals:   10/01/17 1320  TempSrc: Oral  PainSc: 2       Patients Stated Pain Goal: 3 (81/59/47 0761)  Complications: No apparent anesthesia complications

## 2017-10-01 NOTE — Anesthesia Procedure Notes (Signed)
Procedure Name: LMA Insertion Date/Time: 10/01/2017 2:59 PM Performed by: Elenore Paddy, CRNA Pre-anesthesia Checklist: Patient identified, Emergency Drugs available, Suction available, Patient being monitored and Timeout performed Patient Re-evaluated:Patient Re-evaluated prior to induction Oxygen Delivery Method: Circle system utilized Preoxygenation: Pre-oxygenation with 100% oxygen Induction Type: IV induction LMA: LMA with gastric port inserted LMA Size: 4.0 Number of attempts: 1 Placement Confirmation: positive ETCO2 and breath sounds checked- equal and bilateral Dental Injury: Teeth and Oropharynx as per pre-operative assessment

## 2017-10-01 NOTE — Discharge Instructions (Addendum)
Hysteroscopy, Care After Refer to this sheet in the next few weeks. These instructions provide you with information on caring for yourself after your procedure. Your health care provider may also give you more specific instructions. Your treatment has been planned according to current medical practices, but problems sometimes occur. Call your health care provider if you have any problems or questions after your procedure. What can I expect after the procedure? After your procedure, it is typical to have the following:  You may have some cramping. This normally lasts for a couple days.  You may have bleeding. This can vary from light spotting for a few days to menstrual-like bleeding for 3-7 days.  Follow these instructions at home:  Rest for the first 1-2 days after the procedure.  Only take over-the-counter or prescription medicines as directed by your health care provider. Do not take aspirin. It can increase the chances of bleeding.  Take showers instead of baths for 2 weeks or as directed by your health care provider.  Do not drive for 24 hours or as directed.  Do not drink alcohol while taking pain medicine.  Do not use tampons, douche, or have sexual intercourse for 2 weeks or until your health care provider says it is okay.  Take your temperature twice a day for 4-5 days. Write it down each time.  Follow your health care provider's advice about diet, exercise, and lifting.  If you develop constipation, you may: ? Take a mild laxative if your health care provider approves. ? Add bran foods to your diet. ? Drink enough fluids to keep your urine clear or pale yellow.  Try to have someone with you or available to you for the first 24-48 hours, especially if you were given a general anesthetic.  Follow up with your health care provider as directed. Contact a health care provider if:  You feel dizzy or lightheaded.  You feel sick to your stomach (nauseous).  You have  abnormal vaginal discharge.  You have a rash.  You have pain that is not controlled with medicine. Get help right away if:  You have bleeding that is heavier than a normal menstrual period.  You have a fever.  You have increasing cramps or pain, not controlled with medicine.  You have new belly (abdominal) pain.  You pass out.  You have pain in the tops of your shoulders (shoulder strap areas).  You have shortness of breath. This information is not intended to replace advice given to you by your health care provider. Make sure you discuss any questions you have with your health care provider. Document Released: 03/30/2013 Document Revised: 11/15/2015 Document Reviewed: 01/06/2013 Elsevier Interactive Patient Education  2017 Elsevier Inc  DISCHARGE INSTRUCTIONS: HYSTEROSCOPY / ENDOMETRIAL ABLATION The following instructions have been prepared to help you care for yourself upon your return home.  May Remove Scop patch on or before: 4/14  May take Ibuprofen after: 9:30 pm  tonight  May take stool softner while taking narcotic pain medication to prevent constipation.  Drink plenty of water.  Personal hygiene:  Use sanitary pads for vaginal drainage, not tampons.  Shower the day after your procedure.  NO tub baths, pools or Jacuzzis for 2-3 weeks.  Wipe front to back after using the bathroom.  Activity and limitations:  Do NOT drive or operate any equipment for 24 hours. The effects of anesthesia are still present and drowsiness may result.  Do NOT rest in bed all day.  Walking is encouraged.  Walk up and down stairs slowly.  You may resume your normal activity in one to two days or as indicated by your physician. Sexual activity: NO intercourse for at least 2 weeks after the procedure, or as indicated by your Doctor.  Diet: Eat a light meal as desired this evening. You may resume your usual diet tomorrow.  Return to Work: You may resume your work activities in  one to two days or as indicated by Marine scientist.  What to expect after your surgery: Expect to have vaginal bleeding/discharge for 2-3 days and spotting for up to 10 days. It is not unusual to have soreness for up to 1-2 weeks. You may have a slight burning sensation when you urinate for the first day. Mild cramps may continue for a couple of days. You may have a regular period in 2-6 weeks.  Call your doctor for any of the following:  Excessive vaginal bleeding or clotting, saturating and changing one pad every hour.  Inability to urinate 6 hours after discharge from hospital.  Pain not relieved by pain medication.  Fever of 100.4 F or greater.  Unusual vaginal discharge or odor.  Return to office _________________Call for an appointment ___________________ Patients signature: ______________________ Nurses signature ________________________  Post Anesthesia Care Unit [phone]   Post Anesthesia Home Care Instructions  Activity: Get plenty of rest for the remainder of the day. A responsible individual must stay with you for 24 hours following the procedure.  For the next 24 hours, DO NOT: -Drive a car -Paediatric nurse -Drink alcoholic beverages -Take any medication unless instructed by your physician -Make any legal decisions or sign important papers.  Meals: Start with liquid foods such as gelatin or soup. Progress to regular foods as tolerated. Avoid greasy, spicy, heavy foods. If nausea and/or vomiting occur, drink only clear liquids until the nausea and/or vomiting subsides. Call your physician if vomiting continues.  Special Instructions/Symptoms: Your throat may feel dry or sore from the anesthesia or the breathing tube placed in your throat during surgery. If this causes discomfort, gargle with warm salt water. The discomfort should disappear within 24 hours.  If you had a scopolamine patch placed behind your ear for the management of post- operative nausea  and/or vomiting:  1. The medication in the patch is effective for 72 hours, after which it should be removed.  Wrap patch in a tissue and discard in the trash. Wash hands thoroughly with soap and water. 2. You may remove the patch earlier than 72 hours if you experience unpleasant side effects which may include dry mouth, dizziness or visual disturbances. 3. Avoid touching the patch. Wash your hands with soap and water after contact with the patch.

## 2017-10-01 NOTE — H&P (Signed)
Chelsea Cannon is an 52 y.o. female with probable prolapsing endometrial polyp through her cervix. She presented to the office with concern of possible vaginal prolapse. Exam revealed above findings.    Menstrual History: Menarche age: 41 Patient's last menstrual period was 08/24/2017 (exact date).    Past Medical History:  Diagnosis Date  . Anemia   . Gallstones   . GERD (gastroesophageal reflux disease)    occasional - tx w/ pepcid prn  . Jaundice   . Menorrhagia   . Poor dental hygiene    upper broken teeth  . Retained gallstones following laparoscopic cholecystectomy   . SVD (spontaneous vaginal delivery)    x 4  . Wears contact lenses     Past Surgical History:  Procedure Laterality Date  . CHOLECYSTECTOMY N/A 03/22/2016   Procedure: LAPAROSCOPIC CHOLECYSTECTOMY WITH INTRAOPERATIVE CHOLANGIOGRAM;  Surgeon: Georganna Skeans, MD;  Location: [place];  Service: General;  Laterality: N/A;  . ERCP N/A 08/08/2016   Procedure: ENDOSCOPIC RETROGRADE CHOLANGIOPANCREATOGRAPHY (ERCP);  Surgeon: Doran Stabler, MD;  Location: [facility] ENDOSCOPY;  Service: Endoscopy;  Laterality: N/A;  . ERCP N/A 09/03/2016   Procedure: ENDOSCOPIC RETROGRADE CHOLANGIOPANCREATOGRAPHY (ERCP);  Surgeon: Doran Stabler, MD;  Location: [facility] ENDOSCOPY;  Service: Gastroenterology;  Laterality: N/A;  . ERCP N/A 09/12/2016   Procedure: ENDOSCOPIC RETROGRADE CHOLANGIOPANCREATOGRAPHY (ERCP);  Surgeon: Irene Shipper, MD;  Location: [facility] ENDOSCOPY;  Service: Endoscopy;  Laterality: N/A;  . SPYGLASS CHOLANGIOSCOPY N/A 09/03/2016   Procedure: WHQPRFFM CHOLANGIOSCOPY;  Surgeon: Doran Stabler, MD;  Location: [facility] ENDOSCOPY;  Service: Gastroenterology;  Laterality: N/A;    Family History  Problem Relation Age of Onset  . Colon cancer Mother   . Diabetes Father   . Hypertension Father   . Cancer Father 54       Lung  . Heart disease Father   . Diabetes Sister   . Breast cancer Sister   . Breast cancer Paternal Aunt      Social History:  reports that she has never smoked. She has never used smokeless tobacco. She reports that she does not drink alcohol or use drugs.  Allergies:  Allergies  Allergen Reactions  . Ciprofloxacin Other (See Comments)    Hepatitis/ elevated LFTs    No medications prior to admission.    Review of Systems  Constitutional: Negative.   Respiratory: Negative.   Cardiovascular: Negative.   Gastrointestinal: Negative.   Genitourinary: Negative.     Height 5\' 4"  (1.626 m), weight 77.6 kg (171 lb), last menstrual period 08/24/2017. Physical Exam  Constitutional: She is oriented to person, place, and time. She appears well-developed and well-nourished.  Cardiovascular: Normal rate and regular rhythm.  Respiratory: Effort normal and breath sounds normal.  GI: Soft. Bowel sounds are normal.  Genitourinary:  Genitourinary Comments: Nl EGBUS prolapsing endometrial polyp through cervix, uterus small, non tender, no masses  Musculoskeletal: Normal range of motion.  Neurological: She is alert and oriented to person, place, and time.    No results found for this or any previous visit (from the past 24 hour(s)).  No results found.  Assessment/Plan: Probable prolapsing endometrial polyp.  Pt is being admitted today for hysteroscopy D & C. R/B/post op care have been reviewed with pt. She has verbalized understanding and agrees to Sun.   Chancy Milroy 10/01/2017, 10:53 AM

## 2017-10-01 NOTE — Op Note (Signed)
PREOPERATIVE DIAGNOSIS:  Prolapsing endometrial polyp POSTOPERATIVE DIAGNOSIS: Prolapsing endocervical polyp PROCEDURE: Hysteroscopy, D & C, polypectomy SURGEON:  Dr. Arlina Robes   INDICATIONS: 52 y.o. [id number]  here for scheduled surgery for the aforementioned diagnoses.   Risks of surgery were discussed with the patient including but not limited to: bleeding which may require transfusion; infection which may require antibiotics; injury to uterus or surrounding organs; intrauterine scarring which may impair future fertility; need for additional procedures including laparotomy or laparoscopy; and other postoperative/anesthesia complications. Written informed consent was obtained.    FINDINGS:  A 10 week size uterus with 8 x 2 cm prolapsing endocervical polyp. 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  Scant atrophic endometrium.    ANESTHESIA:   General INTRAVENOUS FLUIDS:  1000 ml of LR FLUID DEFICITS:  300 cc Crystalloid ESTIMATED BLOOD LOSS:  Less than 20 ml SPECIMENS: Endometrial curettings and endocervical polyp to pathology  COMPLICATIONS:  None immediate.  PROCEDURE DETAILS:  The pt was then taken to the operating room where general anesthesia was administered and was found to be adequate.  After an adequate timeout was performed, she was placed in the dorsal lithotomy position and examined; then prepped and draped in the sterile manner.   Her bladder was catheterized for an unmeasured amount of clear, yellow urine. A speculum was then placed in the patient's vagina and a single tooth tenaculum was applied to the anterior lip of the cervix.  The prolapsing polyp was grasped and a # 1 Vicryl endoloop was used to secure the base of the polyp. It was noted at this time to be endocervical. The polyp was removed. The uterus was sounded to 10 cm. Hysteroscope was easily introduced into the endometrial cavity and the above findings were noted. and dilated manually with metal dilators to  accommodate the 5 mm diagnostic hysteroscope.  A sharp curettage was then performed. The polyp base was the cauterized with the Bovine and Monsel's solution was applied. Hemostasis was observed.   The tenaculum was removed from the anterior lip of the cervix and the vaginal speculum was removed after noting good hemostasis.  The patient tolerated the procedure well and was taken to the recovery area awake, extubated and in stable condition.  The patient will be discharged to home as per PACU criteria.  Routine postoperative instructions given.  She was prescribed Percocet, Ibuprofen and Colace.  She will follow up in the clinic on 4  for postoperative evaluation.

## 2017-10-02 ENCOUNTER — Encounter (HOSPITAL_COMMUNITY): Payer: Self-pay | Admitting: Obstetrics and Gynecology

## 2017-10-06 ENCOUNTER — Telehealth: Payer: Self-pay

## 2017-10-06 ENCOUNTER — Encounter (HOSPITAL_COMMUNITY): Payer: Self-pay | Admitting: Emergency Medicine

## 2017-10-06 ENCOUNTER — Emergency Department (HOSPITAL_COMMUNITY)
Admission: EM | Admit: 2017-10-06 | Discharge: 2017-10-06 | Disposition: A | Discharge status: Home / Self Care | Payer: Medicaid Other | Attending: Emergency Medicine | Admitting: Emergency Medicine

## 2017-10-06 ENCOUNTER — Emergency Department (HOSPITAL_COMMUNITY): Payer: Medicaid Other

## 2017-10-06 DIAGNOSIS — J45909 Unspecified asthma, uncomplicated: Secondary | ICD-10-CM | POA: Diagnosis not present

## 2017-10-06 DIAGNOSIS — R0602 Shortness of breath: Secondary | ICD-10-CM | POA: Diagnosis not present

## 2017-10-06 DIAGNOSIS — Z79899 Other long term (current) drug therapy: Secondary | ICD-10-CM | POA: Diagnosis not present

## 2017-10-06 LAB — URINALYSIS, ROUTINE W REFLEX MICROSCOPIC
BILIRUBIN URINE: NEGATIVE
Glucose, UA: NEGATIVE mg/dL
Ketones, ur: NEGATIVE mg/dL
Nitrite: NEGATIVE
Protein, ur: NEGATIVE mg/dL
SPECIFIC GRAVITY, URINE: 1.018 (ref 1.005–1.030)
pH: 5 (ref 5.0–8.0)

## 2017-10-06 LAB — BASIC METABOLIC PANEL
Anion gap: 12 (ref 5–15)
BUN: 10 mg/dL (ref 6–20)
CHLORIDE: 106 mmol/L (ref 101–111)
CO2: 18 mmol/L — AB (ref 22–32)
CREATININE: 0.75 mg/dL (ref 0.44–1.00)
Calcium: 9.6 mg/dL (ref 8.9–10.3)
GFR calc Af Amer: >60 mL/min (ref >60)
GFR calc non Af Amer: >60 mL/min (ref >60)
Glucose, Bld: 144 mg/dL — ABNORMAL HIGH (ref 65–99)
Potassium: 3.6 mmol/L (ref 3.5–5.1)
Sodium: 136 mmol/L (ref 135–145)

## 2017-10-06 LAB — I-STAT TROPONIN, ED: Troponin i, poc: 0 ng/mL (ref 0.00–0.08)

## 2017-10-06 LAB — I-STAT BETA HCG BLOOD, ED (MC, WL, AP ONLY): I-stat hCG, quantitative: <5 m[IU]/mL (ref <5)

## 2017-10-06 LAB — CBC
HCT: 37.8 % (ref 36.0–46.0)
Hemoglobin: 12.1 g/dL (ref 12.0–15.0)
MCH: 24.4 pg — AB (ref 26.0–34.0)
MCHC: 32 g/dL (ref 30.0–36.0)
MCV: 76.4 fL — AB (ref 78.0–100.0)
PLATELETS: 535 10*3/uL — AB (ref 150–400)
RBC: 4.95 MIL/uL (ref 3.87–5.11)
RDW: 14.9 % (ref 11.5–15.5)
WBC: 13.9 10*3/uL — ABNORMAL HIGH (ref 4.0–10.5)

## 2017-10-06 LAB — D-DIMER, QUANTITATIVE: D-Dimer, Quant: 0.44 ug/mL-FEU (ref 0.00–0.50)

## 2017-10-06 MED ORDER — ALBUTEROL SULFATE (2.5 MG/3ML) 0.083% IN NEBU
5.0000 mg | INHALATION_SOLUTION | Freq: Once | RESPIRATORY_TRACT | Status: AC
  Administered 2017-10-06: 5 mg via RESPIRATORY_TRACT
  Filled 2017-10-06: qty 6

## 2017-10-06 NOTE — ED Notes (Signed)
EDP at bedside  

## 2017-10-06 NOTE — Telephone Encounter (Signed)
Pt called stating that she is having shortness of breath, tightness in her chest, dizziness, and elevated heart rate. Sx has been present since her procedure 4/11. Pt advised to go ER to be evaluated

## 2017-10-06 NOTE — ED Triage Notes (Signed)
Pt to ER for evaluation of chest tightness and shortness of breath x4 days. States had a procedure done on 4/11 under anesthesia. Pt tachycardic at 110. In NAD otherwise.

## 2017-10-06 NOTE — ED Provider Notes (Signed)
[facility] EMERGENCY DEPARTMENT Provider Note   CSN: [id number] Arrival date & time: 10/06/17  1007     History   Chief Complaint Chief Complaint  Patient presents with  . Shortness of Breath    HPI Chelsea Cannon is a 52 y.o. female.  Patient notes recent gyn surgical procedure 4/11, and that in past few days felt mildly sob/tight in chest. Symptoms constant, persistent, at rest. No relation to activity or exertion. Denies cough or uri symptoms. No fever or chills. Denies any pleuritic symptoms or pleuritic pain. No leg pain or swelling. No hx dvt or pe. Non smoker. Denies hx cad or fam hx premature cad. No hx asthma or lung disease.   The history is provided by the patient.  Chest Pain   Associated symptoms include shortness of breath. Pertinent negatives include no abdominal pain, no fever, no headaches, no palpitations and no vomiting.    Past Medical History:  Diagnosis Date  . Anemia   . Gallstones   . GERD (gastroesophageal reflux disease)    occasional - tx w/ pepcid prn  . Jaundice   . Menorrhagia   . Poor dental hygiene    upper broken teeth  . Retained gallstones following laparoscopic cholecystectomy   . SVD (spontaneous vaginal delivery)    x 4  . Wears contact lenses     Patient Active Problem List   Diagnosis Date Noted  . Cervical polyp 08/12/2017  . Endometrial polyp 08/12/2017  . Hyperglycemia 09/12/2016  . Elevated LFTs 08/07/2016  . Jaundice 08/07/2016  . Asthma 08/07/2016  . Anemia 08/07/2016  . Menorrhagia 08/07/2016  . Acute epigastric pain 03/20/2016  . Fatty liver disease, nonalcoholic 78/29/5621    Past Surgical History:  Procedure Laterality Date  . CHOLECYSTECTOMY N/A 03/22/2016   Procedure: LAPAROSCOPIC CHOLECYSTECTOMY WITH INTRAOPERATIVE CHOLANGIOGRAM;  Surgeon: Georganna Skeans, MD;  Location: [facility];  Service: General;  Laterality: N/A;  . DILATATION & CURETTAGE/HYSTEROSCOPY WITH MYOSURE N/A 10/01/2017   Procedure: DILATATION & CURETTAGE/HYSTEROSCOPY with ENDOCERVICAL POLYPECTOMY;  Surgeon: Chancy Milroy, MD;  Location: [facility];  Service: Gynecology;  Laterality: N/A;  myosure rep will be here Arielle confirmed on 09/08/17  . ERCP N/A 08/08/2016   Procedure: ENDOSCOPIC RETROGRADE CHOLANGIOPANCREATOGRAPHY (ERCP);  Surgeon: Doran Stabler, MD;  Location: [facility] END[facility]COPY;  Service: Endoscopy;  Laterality: N/A;  . ERCP N/A 09/03/2016   Procedure: ENDOSCOPIC RETROGRADE CHOLANGIOPANCREATOGRAPHY (ERCP);  Surgeon: Doran Stabler, MD;  Location: [facility];  Service: Gastroenterology;  Laterality: N/A;  . ERCP N/A 09/12/2016   Procedure: ENDOSCOPIC RETROGRADE CHOLANGIOPANCREATOGRAPHY (ERCP);  Surgeon: Irene Shipper, MD;  Location: [facility];  Service: Endoscopy;  Laterality: N/A;  . SPYGLASS CHOLANGIOSCOPY N/A 09/03/2016   Procedure: HYQMVHQI CHOLANGIOSCOPY;  Surgeon: Doran Stabler, MD;  Location: [facility];  Service: Gastroenterology;  Laterality: N/A;     OB History    Gravida  5   Para  4   Term  3   Preterm  1   AB  1   Living  1     SAB  1   TAB      Ectopic      Multiple      Live Births  1            Home Medications    Prior to Admission medications   Medication Sig Start Date End Date Taking? Authorizing Provider  famotidine (PEPCID) 10 MG tablet Take 10 mg by mouth  as needed for heartburn or indigestion.   Yes [provider]  HYDROcodone-acetaminophen (NORCO/VICODIN) 5-325 MG tablet Take 1 tablet by mouth every 6 (six) hours as needed for moderate pain. 10/01/17  Yes Chancy Milroy, MD  naproxen sodium (ALEVE) 220 MG tablet Take 660 mg by mouth as needed (pain).   Yes [provider]  ibuprofen (ADVIL,MOTRIN) 800 MG tablet Take 1 tablet (800 mg total) by mouth every 8 (eight) hours as needed. Patient not taking: Reported on 10/06/2017 10/01/17   Chancy Milroy, MD    Family History Family History  Problem Relation Age of Onset  .  Colon cancer Mother   . Diabetes Father   . Hypertension Father   . Cancer Father 33       Lung  . Heart disease Father   . Diabetes Sister   . Breast cancer Sister   . Breast cancer Paternal Aunt     Social History Social History   Tobacco Use  . Smoking status: Never Smoker  . Smokeless tobacco: Never Used  Substance Use Topics  . Alcohol use: No  . Drug use: No     Allergies   Ciprofloxacin   Review of Systems Review of Systems  Constitutional: Negative for fever.  HENT: Negative for sore throat.   Eyes: Negative for redness.  Respiratory: Positive for shortness of breath.   Cardiovascular: Negative for palpitations and leg swelling.  Gastrointestinal: Negative for abdominal pain and vomiting.  Genitourinary: Negative for dysuria and flank pain.  Musculoskeletal: Negative for neck pain.  Skin: Negative for rash.  Neurological: Negative for headaches.  Hematological: Does not bruise/bleed easily.  Psychiatric/Behavioral: Negative for confusion.     Physical Exam Updated Vital Signs BP (!) 147/89   Pulse 93   Temp 98.9 F (37.2 C) (Oral)   Resp 19   LMP 08/23/2017   SpO2 100%   Physical Exam  Constitutional: She appears well-developed and well-nourished. No distress.  HENT:  Mouth/Throat: Oropharynx is clear and moist.  Eyes: Conjunctivae are normal. No scleral icterus.  Neck: Neck supple. No tracheal deviation present. No thyromegaly present.  Cardiovascular: Normal rate, regular rhythm, normal heart sounds and intact distal pulses. Exam reveals no gallop and no friction rub.  No murmur heard. Pulmonary/Chest: Effort normal and breath sounds normal. No respiratory distress.  Abdominal: Soft. Normal appearance and bowel sounds are normal. She exhibits no distension. There is no tenderness.  Genitourinary:  Genitourinary Comments: No cva tenderness  Musculoskeletal: She exhibits no edema or tenderness.  Neurological: She is alert.  Skin: Skin is  warm and dry. No rash noted. She is not diaphoretic.  Psychiatric: She has a normal mood and affect.  Nursing note and vitals reviewed.    ED Treatments / Results  Labs (all labs ordered are listed, but only abnormal results are displayed) Results for orders placed or performed during the hospital encounter of 02/72/53  Basic metabolic panel  Result Value Ref Range   Sodium 136 135 - 145 mmol/L   Potassium 3.6 3.5 - 5.1 mmol/L   Chloride 106 101 - 111 mmol/L   CO2 18 (L) 22 - 32 mmol/L   Glucose, Bld 144 (H) 65 - 99 mg/dL   BUN 10 6 - 20 mg/dL   Creatinine, Ser 0.75 0.44 - 1.00 mg/dL   Calcium 9.6 8.9 - 10.3 mg/dL   GFR calc non Af Amer >60 >60 mL/min   GFR calc Af Amer >60 >60 mL/min   Anion  gap 12 5 - 15  CBC  Result Value Ref Range   WBC 13.9 (H) 4.0 - 10.5 K/uL   RBC 4.95 3.87 - 5.11 MIL/uL   Hemoglobin 12.1 12.0 - 15.0 g/dL   HCT 37.8 36.0 - 46.0 %   MCV 76.4 (L) 78.0 - 100.0 fL   MCH 24.4 (L) 26.0 - 34.0 pg   MCHC 32.0 30.0 - 36.0 g/dL   RDW 14.9 11.5 - 15.5 %   Platelets 535 (H) 150 - 400 K/uL  D-dimer, quantitative (not at [facility])  Result Value Ref Range   D-Dimer, Quant 0.44 0.00 - 0.50 ug/mL-FEU  Urinalysis, Routine w reflex microscopic  Result Value Ref Range   Color, Urine YELLOW YELLOW   APPearance CLEAR CLEAR   Specific Gravity, Urine 1.018 1.005 - 1.030   pH 5.0 5.0 - 8.0   Glucose, UA NEGATIVE NEGATIVE mg/dL   Hgb urine dipstick SMALL (A) NEGATIVE   Bilirubin Urine NEGATIVE NEGATIVE   Ketones, ur NEGATIVE NEGATIVE mg/dL   Protein, ur NEGATIVE NEGATIVE mg/dL   Nitrite NEGATIVE NEGATIVE   Leukocytes, UA SMALL (A) NEGATIVE   RBC / HPF 0-5 0 - 5 RBC/hpf   WBC, UA 0-5 0 - 5 WBC/hpf   Bacteria, UA RARE (A) NONE SEEN   Squamous Epithelial / LPF 0-5 (A) NONE SEEN   Mucus PRESENT   I-stat troponin, ED  Result Value Ref Range   Troponin i, poc 0.00 0.00 - 0.08 ng/mL   Comment 3          I-Stat beta hCG blood, ED  Result Value Ref Range   I-stat hCG,  quantitative <5.0 <5 mIU/mL   Comment 3           Dg Chest 2 View  Result Date: 10/06/2017 CLINICAL DATA:  Chest pain. EXAM: CHEST - 2 VIEW COMPARISON:  Chest x-ray dated September 03, 2016. FINDINGS: The heart size and mediastinal contours are within normal limits. Both lungs are clear. The visualized skeletal structures are unremarkable. IMPRESSION: No active cardiopulmonary disease. Electronically Signed   By: Titus Dubin M.D.   On: 10/06/2017 10:45    EKG EKG Interpretation  Date/Time:  Tuesday October 06 2017 10:12:05 EDT Ventricular Rate:  110 PR Interval:  132 QRS Duration: 84 QT Interval:  320 QTC Calculation: 433 R Axis:   32 Text Interpretation:  Sinus tachycardia Nonspecific ST abnormality Confirmed by Lajean Saver [phone]) on 10/06/2017 12:35:57 PM   Radiology Dg Chest 2 View  Result Date: 10/06/2017 CLINICAL DATA:  Chest pain. EXAM: CHEST - 2 VIEW COMPARISON:  Chest x-ray dated September 03, 2016. FINDINGS: The heart size and mediastinal contours are within normal limits. Both lungs are clear. The visualized skeletal structures are unremarkable. IMPRESSION: No active cardiopulmonary disease. Electronically Signed   By: Titus Dubin M.D.   On: 10/06/2017 10:45    Procedures Procedures (including critical care time)  Medications Ordered in ED Medications - No data to display   Initial Impression / Assessment and Plan / ED Course  I have reviewed the triage vital signs and the nursing notes.  Pertinent labs & imaging results that were available during my care of the patient were reviewed by me and considered in my medical decision making (see chart for details).  Iv ns. Labs. Cxr. Ecg.  After symptoms present for past 3-4 days, trop is 0 - does not appear c/w acs.   ddimer is normal.  Recheck hr is 84, rr 15, pulse ox  100% room air.   Given recent procedure, anesthesia, ?possible mild bronchospasm, ? Possible gerd.  Alb neb.  Po fluids.     Final Clinical  Impressions(s) / ED Diagnoses   Final diagnoses:  None    ED Discharge Orders    None       Lajean Saver, MD 10/07/17 1303

## 2017-10-06 NOTE — Discharge Instructions (Addendum)
It was our pleasure to provide your ER care today - we hope that you feel better.  Your tests in the ER today look good, your vital signs normal, and oxygen saturation 100%.  Follow up with your doctor in the next few days for recheck if symptoms fail to improve/resolve.  Given recent symptoms (and family history heart disease), follow up with cardiologist in the next 1-2 weeks - see referral - call office to arrange appointment.   Return to ER if worse, new symptoms, fevers, increased trouble breathing, new or severe pain, recurrent or persistent chest pain, or other concern.

## 2017-11-10 ENCOUNTER — Ambulatory Visit: Payer: Self-pay | Admitting: Obstetrics and Gynecology

## 2017-11-19 ENCOUNTER — Encounter: Payer: Self-pay | Admitting: Obstetrics and Gynecology

## 2017-11-19 ENCOUNTER — Ambulatory Visit (INDEPENDENT_AMBULATORY_CARE_PROVIDER_SITE_OTHER): Payer: Self-pay | Admitting: Obstetrics and Gynecology

## 2017-11-19 VITALS — BP 117/76 | HR 86 | Wt 168.3 lb

## 2017-11-19 DIAGNOSIS — Z9889 Other specified postprocedural states: Secondary | ICD-10-CM

## 2017-11-19 DIAGNOSIS — N921 Excessive and frequent menstruation with irregular cycle: Secondary | ICD-10-CM

## 2017-11-19 NOTE — Progress Notes (Signed)
Patient ID: Chelsea Cannon, female   DOB: 08-30-1965, 52 y.o.   MRN: [id number] [name] presents for post op visit. S/P D & C and removal of endocervical polyp on 10/01/17.  Pt reports cycles are still heavy particularly days 2-4. LMP 11/07/17  Pathology reviewed, endocervical polyp and proliferative endometrium  Denies any bowel or bladder dysfunction. Sexual active without problems  TSVD x 4, largest @ 9 #  PE AF VSS Lungs clear Heart RRR Abd soft + BS GU nl EGBUS cervix without lesions, uterus small < 8 weeks size mobile non tender no masses  A/P Post OP        Menorraghia  Latest CBC no evidence of anemia. Will check GYN U/S. Pt referred to [facility] program for pap smear and screening mammogram. F/U per GYN U/S results

## 2017-11-19 NOTE — Progress Notes (Signed)
Presents for PO FU.  C/o of heavy bleeding, changing overnight pads every 2-3 hrs.

## 2017-11-26 ENCOUNTER — Ambulatory Visit (HOSPITAL_COMMUNITY)
Admission: RE | Admit: 2017-11-26 | Discharge: 2017-11-26 | Disposition: A | Discharge status: Home / Self Care | Payer: Self-pay | Source: Ambulatory Visit | Attending: Obstetrics and Gynecology | Admitting: Obstetrics and Gynecology

## 2017-11-26 DIAGNOSIS — N921 Excessive and frequent menstruation with irregular cycle: Secondary | ICD-10-CM | POA: Insufficient documentation

## 2017-11-26 DIAGNOSIS — D259 Leiomyoma of uterus, unspecified: Secondary | ICD-10-CM | POA: Insufficient documentation

## 2017-12-09 ENCOUNTER — Ambulatory Visit (INDEPENDENT_AMBULATORY_CARE_PROVIDER_SITE_OTHER): Payer: Self-pay | Admitting: Obstetrics and Gynecology

## 2017-12-09 ENCOUNTER — Encounter: Payer: Self-pay | Admitting: Obstetrics and Gynecology

## 2017-12-09 VITALS — BP 133/81 | HR 75 | Ht 64.0 in | Wt 168.7 lb

## 2017-12-09 DIAGNOSIS — N920 Excessive and frequent menstruation with regular cycle: Secondary | ICD-10-CM

## 2017-12-09 DIAGNOSIS — D219 Benign neoplasm of connective and other soft tissue, unspecified: Secondary | ICD-10-CM | POA: Insufficient documentation

## 2017-12-09 MED ORDER — TRANEXAMIC ACID 650 MG PO TABS: 1300.0000 mg | ORAL_TABLET | Freq: Three times a day (TID) | ORAL | 5 refills | Status: DC

## 2017-12-09 NOTE — Progress Notes (Signed)
Patient ID: GERARDINE PELTZ, female   DOB: 1966/05/25, 52 y.o.   MRN: [id number] Mrs Amiri presents for follow up U/S results. U/S demonstrated several uterine fibroids. She still reports lower abd pain with cycle and in between cycles. Cycles are monthly but not same time each month  U/S results reviewed with pt.  PE AF VSS Lungs clear Heart RRR Abd soft + BS  A/P Menorrhagia       Uterine fibroids  Tx options discussed with pt including observation, medical and surgerical. Pt is uncertain at this time. Would like to try Lysteda in the mean time. U/R/B reviewed with pt. Pt encouraged to follow up with [facility] program for pap and mammogram. Pt informed would need to complete and EMBX before proceeding with any additional treatment. Pt will call back to schedule f/u appt.

## 2017-12-09 NOTE — Progress Notes (Signed)
Presents for FU [place] Results.  C/o still having pain 4-5/10

## 2018-01-05 ENCOUNTER — Encounter: Payer: Self-pay | Admitting: Obstetrics and Gynecology

## 2018-01-05 ENCOUNTER — Ambulatory Visit (INDEPENDENT_AMBULATORY_CARE_PROVIDER_SITE_OTHER): Payer: Self-pay | Admitting: Obstetrics and Gynecology

## 2018-01-05 VITALS — BP 130/84 | HR 98 | Ht 64.0 in | Wt 171.3 lb

## 2018-01-05 DIAGNOSIS — D219 Benign neoplasm of connective and other soft tissue, unspecified: Secondary | ICD-10-CM

## 2018-01-05 DIAGNOSIS — Z124 Encounter for screening for malignant neoplasm of cervix: Secondary | ICD-10-CM

## 2018-01-05 DIAGNOSIS — Z1151 Encounter for screening for human papillomavirus (HPV): Secondary | ICD-10-CM

## 2018-01-05 DIAGNOSIS — Z01419 Encounter for gynecological examination (general) (routine) without abnormal findings: Secondary | ICD-10-CM

## 2018-01-05 NOTE — Patient Instructions (Signed)

## 2018-01-05 NOTE — Progress Notes (Signed)
Chelsea Cannon is a 52 y.o. [phone] female here for a routine annual gynecologic exam. She reports an occ spotting with wiping after using the restroom. No cycle since 11/06/17. H/O DUB and uterine fibroids. Had prolapsing fibroid removed this past April. Was placed on Lysteda at post op appt. But no cycle. Denies menopausal Sx. Still reports  intermittent lower left abd/pelvic pain. Not associated Sx.    Gynecologic History Patient's last menstrual period was 11/06/2017. Contraception: none Last Pap: Unknown. Results were: unknown Last mammogram: 7/19. Results were: pending  Obstetric History OB History  Gravida Para Term Preterm AB Living  5 4 3 1 1 4   SAB TAB Ectopic Multiple Live Births  1       4    # Outcome Date GA Lbr Len/2nd Weight Sex Delivery Anes PTL Lv  5 Preterm 2003        LIV  4 SAB 2001          3 Term 1995          2 Term 74          1 Term 33            Past Medical History:  Diagnosis Date  . Anemia   . Gallstones   . GERD (gastroesophageal reflux disease)    occasional - tx w/ pepcid prn  . Jaundice   . Menorrhagia   . Poor dental hygiene    upper broken teeth  . Retained gallstones following laparoscopic cholecystectomy   . SVD (spontaneous vaginal delivery)    x 4  . Wears contact lenses     Past Surgical History:  Procedure Laterality Date  . CHOLECYSTECTOMY N/A 03/22/2016   Procedure: LAPAROSCOPIC CHOLECYSTECTOMY WITH INTRAOPERATIVE CHOLANGIOGRAM;  Surgeon: Georganna Skeans, MD;  Location: [facility];  Service: General;  Laterality: N/A;  . DILATATION & CURETTAGE/HYSTEROSCOPY WITH MYOSURE N/A 10/01/2017   Procedure: DILATATION & CURETTAGE/HYSTEROSCOPY with ENDOCERVICAL POLYPECTOMY;  Surgeon: Chancy Milroy, MD;  Location: [facility];  Service: Gynecology;  Laterality: N/A;  myosure rep will be here Arielle confirmed on 09/08/17  . ERCP N/A 08/08/2016   Procedure: ENDOSCOPIC RETROGRADE CHOLANGIOPANCREATOGRAPHY (ERCP);  Surgeon: Doran Stabler, MD;   Location: [facility] ENDOSCOPY;  Service: Endoscopy;  Laterality: N/A;  . ERCP N/A 09/03/2016   Procedure: ENDOSCOPIC RETROGRADE CHOLANGIOPANCREATOGRAPHY (ERCP);  Surgeon: Doran Stabler, MD;  Location: [facility];  Service: Gastroenterology;  Laterality: N/A;  . ERCP N/A 09/12/2016   Procedure: ENDOSCOPIC RETROGRADE CHOLANGIOPANCREATOGRAPHY (ERCP);  Surgeon: Irene Shipper, MD;  Location: [facility];  Service: Endoscopy;  Laterality: N/A;  . SPYGLASS CHOLANGIOSCOPY N/A 09/03/2016   Procedure: [id number] CHOLANGIOSCOPY;  Surgeon: Doran Stabler, MD;  Location: [facility];  Service: Gastroenterology;  Laterality: N/A;    Current Outpatient Medications on File Prior to Visit  Medication Sig Dispense Refill  . famotidine (PEPCID) 10 MG tablet Take 10 mg by mouth as needed for heartburn or indigestion.    . naproxen sodium (ALEVE) 220 MG tablet Take 660 mg by mouth as needed (pain).    . tranexamic acid (LYSTEDA) 650 MG TABS tablet Take 2 tablets (1,300 mg total) by mouth 3 (three) times daily. Take during menses for a maximum of five days 30 tablet 5   No current facility-administered medications on file prior to visit.     Allergies  Allergen Reactions  . Ciprofloxacin Other (See Comments)    Hepatitis/ elevated LFTs    Social History  Socioeconomic History  . Marital status: Married    Spouse name: Not on file  . Number of children: 4  . Years of education: Not on file  . Highest education level: Not on file  Occupational History  . Occupation: unemployed  Social Needs  . Financial resource strain: Not on file  . Food insecurity:    Worry: Not on file    Inability: Not on file  . Transportation needs:    Medical: Not on file    Non-medical: Not on file  Tobacco Use  . Smoking status: Never Smoker  . Smokeless tobacco: Never Used  Substance and Sexual Activity  . Alcohol use: No  . Drug use: No  . Sexual activity: Yes    Partners: Male    Birth control/protection: None   Lifestyle  . Physical activity:    Days per week: Not on file    Minutes per session: Not on file  . Stress: Not on file  Relationships  . Social connections:    Talks on phone: Not on file    Gets together: Not on file    Attends religious service: Not on file    Active member of club or organization: Not on file    Attends meetings of clubs or organizations: Not on file    Relationship status: Not on file  . Intimate partner violence:    Fear of current or ex partner: Not on file    Emotionally abused: Not on file    Physically abused: Not on file    Forced sexual activity: Not on file  Other Topics Concern  . Not on file  Social History Narrative  . Not on file    Family History  Problem Relation Age of Onset  . Colon cancer Mother   . Diabetes Father   . Hypertension Father   . Cancer Father 27       Lung  . Heart disease Father   . Diabetes Sister   . Breast cancer Sister   . Breast cancer Paternal Aunt     The following portions of the patient's history were reviewed and updated as appropriate: allergies, current medications, past family history, past medical history, past social history, past surgical history and problem list.  Review of Systems Pertinent items noted in HPI and remainder of comprehensive ROS otherwise negative.   Objective:  BP 130/84   Pulse 98   Ht 5\' 4"  (1.626 m)   Wt 77.7 kg (171 lb 4.8 oz)   LMP 11/06/2017   BMI 29.40 kg/m  CONSTITUTIONAL: Well-developed, well-nourished female in no acute distress.  HENT:  Normocephalic, atraumatic, External right and left ear normal. Oropharynx is clear and moist EYES: Conjunctivae and EOM are normal. Pupils are equal, round, and reactive to light. No scleral icterus.  NECK: Normal range of motion, supple, no masses.  Normal thyroid.  SKIN: Skin is warm and dry. No rash noted. Not diaphoretic. No erythema. No pallor. [place]: Alert and oriented to person, place, and time. Normal reflexes, muscle  tone coordination. No cranial nerve deficit noted. PSYCHIATRIC: Normal mood and affect. Normal behavior. Normal judgment and thought content. CARDIOVASCULAR: Normal heart rate noted, regular rhythm RESPIRATORY: Clear to auscultation bilaterally. Effort and breath sounds normal, no problems with respiration noted. BREASTS: Symmetric in size. No masses, skin changes, nipple drainage, or lymphadenopathy. ABDOMEN: Soft, normal bowel sounds, no distention noted.  No tenderness, rebound or guarding.  PELVIC: Normal appearing external genitalia; normal appearing vaginal mucosa  and cervix.  No abnormal discharge noted.  Pap smear obtained.  8 week uterine size, no other palpable masses, no uterine or adnexal tenderness. MUSCULOSKELETAL: Normal range of motion. No tenderness.  No cyanosis, clubbing, or edema.  2+ distal pulses.   Assessment:  Annual gynecologic examination with pap smear Uterine Fibroids  Plan:  Will follow up results of pap smear and manage accordingly. Mammogram completed, results pending Discussed Tx options including, progesterone to start cycle, follow vs surgery Pt undecided at this point. Pt instructed she would need an EMBX before surgery. Pt also informed that could not guarantee that her pain would resolve with surgery.  Routine preventative health maintenance measures emphasized. Please refer to After Visit Summary for other counseling recommendations.    Chancy Milroy, MD, [place] Attending [facility] for [facility], [place]

## 2018-01-06 LAB — CYTOLOGY - PAP
ADEQUACY: ABSENT
DIAGNOSIS: NEGATIVE
HPV: NOT DETECTED

## 2018-01-19 ENCOUNTER — Ambulatory Visit (HOSPITAL_COMMUNITY): Payer: Self-pay

## 2018-03-26 ENCOUNTER — Telehealth: Payer: Self-pay

## 2018-03-26 NOTE — Telephone Encounter (Signed)
Returned call and pt stated that her cycle came on and she is scheduled for endo biopsy next week. Advised pt to call next week if cycle is not lighter for possibly re-scheduling.

## 2018-03-31 ENCOUNTER — Encounter: Payer: Self-pay | Admitting: Obstetrics and Gynecology

## 2018-03-31 ENCOUNTER — Other Ambulatory Visit (HOSPITAL_COMMUNITY)
Admission: RE | Admit: 2018-03-31 | Discharge: 2018-03-31 | Disposition: A | Discharge status: Home / Self Care | Payer: Self-pay | Source: Ambulatory Visit | Attending: Obstetrics and Gynecology | Admitting: Obstetrics and Gynecology

## 2018-03-31 ENCOUNTER — Ambulatory Visit (INDEPENDENT_AMBULATORY_CARE_PROVIDER_SITE_OTHER): Payer: Self-pay | Admitting: Obstetrics and Gynecology

## 2018-03-31 VITALS — BP 121/81 | HR 79 | Wt 168.2 lb

## 2018-03-31 DIAGNOSIS — N921 Excessive and frequent menstruation with irregular cycle: Secondary | ICD-10-CM | POA: Insufficient documentation

## 2018-03-31 DIAGNOSIS — Z3202 Encounter for pregnancy test, result negative: Secondary | ICD-10-CM

## 2018-03-31 LAB — POCT URINE PREGNANCY: Preg Test, Ur: NEGATIVE

## 2018-03-31 NOTE — Progress Notes (Signed)
ENDOMETRIAL BIOPSY     The indications for endometrial biopsy were reviewed.   Risks of the biopsy including cramping, bleeding, infection, uterine perforation, inadequate specimen and need for additional procedures  were discussed. The patient states she understands and agrees to undergo procedure today. Consent was signed. Time out was performed. Urine HCG was negative. During the pelvic exam, the cervix was prepped with Betadine. A single-toothed tenaculum was placed on the anterior lip of the cervix to stabilize it. The 3 mm pipelle was introduced into the endometrial cavity without difficulty to a depth of 8cm, and a moderate amount of tissue was obtained and sent to pathology. The instruments were removed from the patient's vagina. Minimal bleeding from the cervix was noted. The patient tolerated the procedure well. Routine post-procedure instructions were given to the patient.    

## 2018-03-31 NOTE — Patient Instructions (Addendum)
Endometrial Biopsy, Care After This sheet gives you information about how to care for yourself after your procedure. Your health care provider may also give you more specific instructions. If you have problems or questions, contact your health care provider. What can I expect after the procedure? After the procedure, it is common to have:  Mild cramping.  A small amount of vaginal bleeding for a few days. This is normal.  Follow these instructions at home:  Take over-the-counter and prescription medicines only as told by your health care provider.  Do not douche, use tampons, or have sexual intercourse until your health care provider approves.  Return to your normal activities as told by your health care provider. Ask your health care provider what activities are safe for you.  Follow instructions from your health care provider about any activity restrictions, such as restrictions on strenuous exercise or heavy lifting. Contact a health care provider if:  You have heavy bleeding, or bleed for longer than 2 days after the procedure.  You have bad smelling discharge from your vagina.  You have a fever or chills.  You have a burning sensation when urinating or you have difficulty urinating.  You have severe pain in your lower abdomen. Get help right away if:  You have severe cramps in your stomach or back.  You pass large blood clots.  Your bleeding increases.  You become weak or light-headed, or you pass out. Summary  After the procedure, it is common to have mild cramping and a small amount of vaginal bleeding for a few days.  Do not douche, use tampons, or have sexual intercourse until your health care provider approves.  Return to your normal activities as told by your health care provider. Ask your health care provider what activities are safe for you. This information is not intended to replace advice given to you by your health care provider. Make sure you discuss any  questions you have with your health care provider. Document Released: 03/30/2013 Document Revised: 06/25/2016 Document Reviewed: 06/25/2016 Elsevier Interactive Patient Education  2017 Elsevier [employer].  Vaginal Hysterectomy A vaginal hysterectomy is a procedure to remove all or part of the uterus through a small incision in the vagina. In this procedure, your health care provider may remove your entire uterus, including the lower end (cervix). You may need a vaginal hysterectomy to treat:  Uterine fibroids.  A condition that causes the lining of the uterus to grow in other areas (endometriosis).  Problems with pelvic support.  Cancer of the cervix, ovaries, uterus, or tissue that lines the uterus (endometrium).  Excessive (dysfunctional) uterine bleeding.  When removing your uterus, your health care provider may also remove the organs that produce eggs (ovaries) and the tubes that carry eggs to your uterus (fallopian tubes). After a vaginal hysterectomy, you will no longer be able to have a baby. You will also no longer get your menstrual period. Tell a health care provider about:  Any allergies you have.  All medicines you are taking, including vitamins, herbs, eye drops, creams, and over-the-counter medicines.  Any problems you or family members have had with anesthetic medicines.  Any blood disorders you have.  Any surgeries you have had.  Any medical conditions you have.  Whether you are pregnant or may be pregnant. What are the risks? Generally, this is a safe procedure. However, problems may occur, including:  Bleeding.  Infection.  A blood clot that forms in your leg and travels to your lungs (pulmonary  embolism).  Damage to surrounding organs.  Pain during sex.  What happens before the procedure?  Ask your health care provider what organs will be removed during surgery.  Ask your health care provider about: ? Changing or stopping your regular medicines. This  is especially important if you are taking diabetes medicines or blood thinners. ? Taking medicines such as aspirin and ibuprofen. These medicines can thin your blood. Do not take these medicines before your procedure if your health care provider instructs you not to.  Follow instructions from your health care provider about eating or drinking restrictions.  Do not use any tobacco products, such as cigarettes, chewing tobacco, and e-cigarettes. If you need help quitting, ask your health care provider.  Plan to have someone take you home after discharge from the hospital. What happens during the procedure?  To reduce your risk of infection: ? Your health care team will wash or sanitize their hands. ? Your skin will be washed with soap.  An IV tube will be inserted into one of your veins.  You may be given antibiotic medicine to help prevent infection.  You will be given one or more of the following: ? A medicine to help you relax (sedative). ? A medicine to numb the area (local anesthetic). ? A medicine to make you fall asleep (general anesthetic). ? A medicine that is injected into an area of your body to numb everything beyond the injection site (regional anesthetic).  Your surgeon will make an incision in your vagina.  Your surgeon will locate and remove all or part of your uterus.  Your ovaries and fallopian tubes may be removed at the same time.  The incision will be closed with stitches (sutures) that dissolve over time. The procedure may vary among health care providers and hospitals. What happens after the procedure?  Your blood pressure, heart rate, breathing rate, and blood oxygen level will be monitored often until the medicines you were given have worn off.  You will be encouraged to get up and walk around after a few hours to help prevent complications.  You may have IV tubes in place for a few days.  You will be given pain medicine as needed.  Do not drive for 24  hours if you were given a sedative. This information is not intended to replace advice given to you by your health care provider. Make sure you discuss any questions you have with your health care provider. Document Released: 10/01/2015 Document Revised: 11/15/2015 Document Reviewed: 06/24/2015 Elsevier Interactive Patient Education  [employer].

## 2018-04-05 ENCOUNTER — Encounter (HOSPITAL_COMMUNITY): Payer: Self-pay

## 2018-04-13 NOTE — Patient Instructions (Addendum)
Your procedure is scheduled on: Wednesday November 6 at 1:15 PM  Enter through the Main Entrance of [facility] at: 11:45 am  Pick up the phone at the desk and dial [phone].  Call this number if you have problems the morning of surgery: [phone].  Remember: Do NOT eat food: after Midnight on Tuesday November 5 Do NOT drink clear liquids after: 7:15 am day of surgery Take these medicines the morning of surgery with a SIP OF WATER:  STOP ALL VITAMINS, SUPPLEMENTS, HERBAL MEDICATIONS, NSAIDS NOW  BRUSH YOUR TEETH DAY OF SURGEY  DO NOT SMOKE DAY OF SURGERY  Do NOT wear jewelry (body piercing), metal hair clips/bobby pins, make-up, or nail polish. Do NOT wear lotions, powders, or perfumes.  You may wear deoderant. Do NOT shave for 48 hours prior to surgery. Do NOT bring valuables to the hospital. Contacts, dentures, or bridgework may not be worn into surgery.  Leave suitcase in car.  After surgery it may be brought to your room.    For patients admitted to the hospital, checkout time is 11:00 AM the day of discharge.

## 2018-04-14 ENCOUNTER — Other Ambulatory Visit: Payer: Self-pay

## 2018-04-14 ENCOUNTER — Encounter (HOSPITAL_COMMUNITY): Payer: Self-pay

## 2018-04-14 ENCOUNTER — Encounter (HOSPITAL_COMMUNITY)
Admission: RE | Admit: 2018-04-14 | Discharge: 2018-04-14 | Disposition: A | Discharge status: Home / Self Care | Payer: Self-pay | Source: Ambulatory Visit | Attending: Obstetrics and Gynecology | Admitting: Obstetrics and Gynecology

## 2018-04-14 DIAGNOSIS — Z01812 Encounter for preprocedural laboratory examination: Secondary | ICD-10-CM | POA: Insufficient documentation

## 2018-04-14 HISTORY — DX: Stridor: R06.1

## 2018-04-14 HISTORY — DX: Other complications of anesthesia, initial encounter: T88.59XA

## 2018-04-14 HISTORY — DX: Adverse effect of unspecified anesthetic, initial encounter: T41.45XA

## 2018-04-14 LAB — BASIC METABOLIC PANEL
Anion gap: 8 (ref 5–15)
BUN: 17 mg/dL (ref 6–20)
CHLORIDE: 101 mmol/L (ref 98–111)
CO2: 25 mmol/L (ref 22–32)
CREATININE: 0.72 mg/dL (ref 0.44–1.00)
Calcium: 9.4 mg/dL (ref 8.9–10.3)
GFR calc Af Amer: >60 mL/min (ref >60)
GFR calc non Af Amer: >60 mL/min (ref >60)
GLUCOSE: 102 mg/dL — AB (ref 70–99)
POTASSIUM: 4.6 mmol/L (ref 3.5–5.1)
SODIUM: 134 mmol/L — AB (ref 135–145)

## 2018-04-14 LAB — CBC
HEMATOCRIT: 40.1 % (ref 36.0–46.0)
HEMOGLOBIN: 12.8 g/dL (ref 12.0–15.0)
MCH: 25.7 pg — AB (ref 26.0–34.0)
MCHC: 31.9 g/dL (ref 30.0–36.0)
MCV: 80.5 fL (ref 80.0–100.0)
Platelets: 412 10*3/uL — ABNORMAL HIGH (ref 150–400)
RBC: 4.98 MIL/uL (ref 3.87–5.11)
RDW: 16.8 % — AB (ref 11.5–15.5)
WBC: 9.5 10*3/uL (ref 4.0–10.5)
nRBC: 0 % (ref 0.0–0.2)

## 2018-04-27 ENCOUNTER — Encounter (HOSPITAL_COMMUNITY): Payer: Self-pay | Admitting: Anesthesiology

## 2018-04-27 NOTE — H&P (Signed)
Chelsea Cannon is an 52 y.o. female G4P4 with a history of menorrhagia and uterine fibroids refractory to medical management. W/U included normal EMBX and U/S with multiple fibroids Pt desires definite therpay.   Menstrual History: Menarche age: 37 No LMP recorded.    Past Medical History:  Diagnosis Date  . Anemia   . Asthma    IN THE PAST  . Complication of anesthesia   . Gallstones   . GERD (gastroesophageal reflux disease)    occasional - tx w/ pepcid prn  . Jaundice   . Menorrhagia   . Poor dental hygiene    upper broken teeth  . Retained gallstones following laparoscopic cholecystectomy   . Stridor    AFTER WAKING UP FROM SURGERY  . SVD (spontaneous vaginal delivery)    x 4  . Wears contact lenses     Past Surgical History:  Procedure Laterality Date  . CHOLECYSTECTOMY N/A 03/22/2016   Procedure: LAPAROSCOPIC CHOLECYSTECTOMY WITH INTRAOPERATIVE CHOLANGIOGRAM;  Surgeon: Georganna Skeans, MD;  Location: [facility];  Service: General;  Laterality: N/A;  . DILATATION & CURETTAGE/HYSTEROSCOPY WITH MYOSURE N/A 10/01/2017   Procedure: DILATATION & CURETTAGE/HYSTEROSCOPY with ENDOCERVICAL POLYPECTOMY;  Surgeon: Chancy Milroy, MD;  Location: [facility];  Service: Gynecology;  Laterality: N/A;  myosure rep will be here Arielle confirmed on 09/08/17  . ERCP N/A 08/08/2016   Procedure: ENDOSCOPIC RETROGRADE CHOLANGIOPANCREATOGRAPHY (ERCP);  Surgeon: Doran Stabler, MD;  Location: [facility] ENDOSCOPY;  Service: Endoscopy;  Laterality: N/A;  . ERCP N/A 09/03/2016   Procedure: ENDOSCOPIC RETROGRADE CHOLANGIOPANCREATOGRAPHY (ERCP);  Surgeon: Doran Stabler, MD;  Location: [facility];  Service: Gastroenterology;  Laterality: N/A;  . ERCP N/A 09/12/2016   Procedure: ENDOSCOPIC RETROGRADE CHOLANGIOPANCREATOGRAPHY (ERCP);  Surgeon: Irene Shipper, MD;  Location: [facility];  Service: Endoscopy;  Laterality: N/A;  . SPYGLASS CHOLANGIOSCOPY N/A 09/03/2016   Procedure: KXFGHWEX CHOLANGIOSCOPY;   Surgeon: Doran Stabler, MD;  Location: [facility] ENDOSCOPY;  Service: Gastroenterology;  Laterality: N/A;    Family History  Problem Relation Age of Onset  . Colon cancer Mother   . Diabetes Father   . Hypertension Father   . Cancer Father 1       Lung  . Heart disease Father   . Diabetes Sister   . Breast cancer Sister   . Breast cancer Paternal Aunt     Social History:  reports that she has never smoked. She has never used smokeless tobacco. She reports that she does not drink alcohol or use drugs.  Allergies:  Allergies  Allergen Reactions  . Ciprofloxacin Other (See Comments)    Hepatitis/ elevated LFTs    No medications prior to admission.    Review of Systems  Constitutional: Negative.   Respiratory: Negative.   Cardiovascular: Negative.   Gastrointestinal: Negative.   Genitourinary: Negative.     There were no vitals taken for this visit. Physical Exam  Constitutional: She appears well-developed and well-nourished.  Cardiovascular: Normal rate, regular rhythm and normal heart sounds.  Respiratory: Effort normal and breath sounds normal.  GI: Soft. Bowel sounds are normal.  Genitourinary:  Genitourinary Comments: Nl EGBUS, uterus 10 weeks mobile non tender no adnexal masses or tenderness    No results found for this or any previous visit (from the past 24 hour(s)).  No results found.  Assessment/Plan: Menorrhagia Uterine Fibroids  TVH/BS has been recommended. R/B/post op care have been reviewed. Pt desires to proceed.   Chancy Milroy 04/27/2018, 8:54 AM

## 2018-04-28 ENCOUNTER — Observation Stay (HOSPITAL_COMMUNITY)
Admission: RE | Admit: 2018-04-28 | Discharge: 2018-04-29 | Disposition: A | Discharge status: Home / Self Care | Payer: Self-pay | Source: Ambulatory Visit | Attending: Obstetrics and Gynecology | Admitting: Obstetrics and Gynecology

## 2018-04-28 ENCOUNTER — Observation Stay (HOSPITAL_COMMUNITY): Payer: Self-pay | Admitting: Anesthesiology

## 2018-04-28 ENCOUNTER — Encounter (HOSPITAL_COMMUNITY): Payer: Self-pay

## 2018-04-28 ENCOUNTER — Encounter (HOSPITAL_COMMUNITY)
Admission: RE | Disposition: A | Discharge status: Home / Self Care | Payer: Self-pay | Source: Ambulatory Visit | Attending: Obstetrics and Gynecology

## 2018-04-28 ENCOUNTER — Other Ambulatory Visit: Payer: Self-pay

## 2018-04-28 DIAGNOSIS — D259 Leiomyoma of uterus, unspecified: Principal | ICD-10-CM | POA: Insufficient documentation

## 2018-04-28 DIAGNOSIS — N920 Excessive and frequent menstruation with regular cycle: Secondary | ICD-10-CM | POA: Insufficient documentation

## 2018-04-28 DIAGNOSIS — Z9889 Other specified postprocedural states: Secondary | ICD-10-CM

## 2018-04-28 DIAGNOSIS — Z23 Encounter for immunization: Secondary | ICD-10-CM | POA: Insufficient documentation

## 2018-04-28 HISTORY — PX: VAGINAL HYSTERECTOMY: SHX2639

## 2018-04-28 LAB — PREGNANCY, URINE: Preg Test, Ur: NEGATIVE

## 2018-04-28 SURGERY — HYSTERECTOMY, VAGINAL
Anesthesia: General | Site: Vagina | Laterality: Bilateral

## 2018-04-28 MED ORDER — ONDANSETRON HCL 4 MG/2ML IJ SOLN: 4.0000 mg | Freq: Four times a day (QID) | INTRAMUSCULAR | Status: DC | PRN

## 2018-04-28 MED ORDER — MIDAZOLAM HCL 2 MG/2ML IJ SOLN
INTRAMUSCULAR | Status: AC
  Filled 2018-04-28: qty 2

## 2018-04-28 MED ORDER — SUGAMMADEX SODIUM 200 MG/2ML IV SOLN
INTRAVENOUS | Status: DC | PRN
  Administered 2018-04-28: 155 mg via INTRAVENOUS

## 2018-04-28 MED ORDER — LACTATED RINGERS IV SOLN
INTRAVENOUS | Status: DC
  Administered 2018-04-28 – 2018-04-29 (×2): via INTRAVENOUS

## 2018-04-28 MED ORDER — LIDOCAINE HCL (CARDIAC) PF 100 MG/5ML IV SOSY
PREFILLED_SYRINGE | INTRAVENOUS | Status: AC
  Filled 2018-04-28: qty 5

## 2018-04-28 MED ORDER — KETOROLAC TROMETHAMINE 30 MG/ML IJ SOLN: 30.0000 mg | Freq: Once | INTRAMUSCULAR | Status: DC | PRN

## 2018-04-28 MED ORDER — SCOPOLAMINE 1 MG/3DAYS TD PT72
MEDICATED_PATCH | TRANSDERMAL | Status: AC
  Administered 2018-04-28: 1.5 mg via TRANSDERMAL
  Filled 2018-04-28: qty 1

## 2018-04-28 MED ORDER — LACTATED RINGERS IV SOLN
INTRAVENOUS | Status: DC
  Administered 2018-04-28 (×2): via INTRAVENOUS

## 2018-04-28 MED ORDER — HYDROMORPHONE HCL 1 MG/ML IJ SOLN
INTRAMUSCULAR | Status: AC
  Filled 2018-04-28: qty 0.5

## 2018-04-28 MED ORDER — HYDROMORPHONE HCL 1 MG/ML IJ SOLN
INTRAMUSCULAR | Status: AC
  Filled 2018-04-28: qty 1

## 2018-04-28 MED ORDER — HYDROMORPHONE HCL 1 MG/ML IJ SOLN
INTRAMUSCULAR | Status: AC
  Administered 2018-04-28: 0.5 mg via INTRAVENOUS
  Filled 2018-04-28: qty 0.5

## 2018-04-28 MED ORDER — LIDOCAINE HCL (CARDIAC) PF 100 MG/5ML IV SOSY
PREFILLED_SYRINGE | INTRAVENOUS | Status: DC | PRN
  Administered 2018-04-28: 100 mg via INTRAVENOUS

## 2018-04-28 MED ORDER — SCOPOLAMINE 1 MG/3DAYS TD PT72
1.0000 | MEDICATED_PATCH | Freq: Once | TRANSDERMAL | Status: DC
  Administered 2018-04-28: 1.5 mg via TRANSDERMAL

## 2018-04-28 MED ORDER — ONDANSETRON HCL 4 MG/2ML IJ SOLN
INTRAMUSCULAR | Status: DC | PRN
  Administered 2018-04-28: 4 mg via INTRAVENOUS

## 2018-04-28 MED ORDER — MIDAZOLAM HCL 2 MG/2ML IJ SOLN
INTRAMUSCULAR | Status: DC | PRN
  Administered 2018-04-28: 2 mg via INTRAVENOUS

## 2018-04-28 MED ORDER — HYDROMORPHONE HCL 1 MG/ML IJ SOLN
1.0000 mg | INTRAMUSCULAR | Status: DC | PRN
  Administered 2018-04-28: 1 mg via INTRAVENOUS
  Filled 2018-04-28: qty 1

## 2018-04-28 MED ORDER — LACTATED RINGERS IV SOLN: INTRAVENOUS | Status: DC

## 2018-04-28 MED ORDER — DEXAMETHASONE SODIUM PHOSPHATE 10 MG/ML IJ SOLN
INTRAMUSCULAR | Status: DC | PRN
  Administered 2018-04-28: 4 mg via INTRAVENOUS

## 2018-04-28 MED ORDER — SOD CITRATE-CITRIC ACID 500-334 MG/5ML PO SOLN
ORAL | Status: AC
  Administered 2018-04-28: 30 mL via ORAL
  Filled 2018-04-28: qty 15

## 2018-04-28 MED ORDER — PROPOFOL 10 MG/ML IV BOLUS
INTRAVENOUS | Status: AC
  Filled 2018-04-28: qty 20

## 2018-04-28 MED ORDER — SOD CITRATE-CITRIC ACID 500-334 MG/5ML PO SOLN
30.0000 mL | ORAL | Status: AC
  Administered 2018-04-28: 30 mL via ORAL

## 2018-04-28 MED ORDER — OXYCODONE-ACETAMINOPHEN 5-325 MG PO TABS
1.0000 | ORAL_TABLET | ORAL | Status: DC | PRN
  Administered 2018-04-28 – 2018-04-29 (×2): 1 via ORAL
  Filled 2018-04-28 (×2): qty 1

## 2018-04-28 MED ORDER — KETOROLAC TROMETHAMINE 30 MG/ML IJ SOLN
INTRAMUSCULAR | Status: AC
  Filled 2018-04-28: qty 1

## 2018-04-28 MED ORDER — PROMETHAZINE HCL 25 MG/ML IJ SOLN: 6.2500 mg | INTRAMUSCULAR | Status: DC | PRN

## 2018-04-28 MED ORDER — ROCURONIUM BROMIDE 100 MG/10ML IV SOLN
INTRAVENOUS | Status: AC
  Filled 2018-04-28: qty 1

## 2018-04-28 MED ORDER — HYDROMORPHONE HCL 1 MG/ML IJ SOLN
INTRAMUSCULAR | Status: DC | PRN
  Administered 2018-04-28: 1 mg via INTRAVENOUS

## 2018-04-28 MED ORDER — CEFAZOLIN SODIUM-DEXTROSE 2-4 GM/100ML-% IV SOLN
2.0000 g | INTRAVENOUS | Status: AC
  Administered 2018-04-28: 2 g via INTRAVENOUS

## 2018-04-28 MED ORDER — MEPERIDINE HCL 25 MG/ML IJ SOLN: 6.2500 mg | INTRAMUSCULAR | Status: DC | PRN

## 2018-04-28 MED ORDER — KETOROLAC TROMETHAMINE 30 MG/ML IJ SOLN: 30.0000 mg | Freq: Four times a day (QID) | INTRAMUSCULAR | Status: DC

## 2018-04-28 MED ORDER — PANTOPRAZOLE SODIUM 40 MG PO TBEC
40.0000 mg | DELAYED_RELEASE_TABLET | Freq: Every day | ORAL | Status: DC
  Filled 2018-04-28: qty 1

## 2018-04-28 MED ORDER — DEXAMETHASONE SODIUM PHOSPHATE 10 MG/ML IJ SOLN
INTRAMUSCULAR | Status: AC
  Filled 2018-04-28: qty 1

## 2018-04-28 MED ORDER — GLYCOPYRROLATE 0.2 MG/ML IJ SOLN
INTRAMUSCULAR | Status: DC | PRN
  Administered 2018-04-28: 0.1 mg via INTRAVENOUS

## 2018-04-28 MED ORDER — ONDANSETRON HCL 4 MG/2ML IJ SOLN
INTRAMUSCULAR | Status: AC
  Filled 2018-04-28: qty 2

## 2018-04-28 MED ORDER — SENNA 8.6 MG PO TABS
1.0000 | ORAL_TABLET | Freq: Two times a day (BID) | ORAL | Status: DC
  Administered 2018-04-28 – 2018-04-29 (×2): 8.6 mg via ORAL
  Filled 2018-04-28 (×4): qty 1

## 2018-04-28 MED ORDER — PROPOFOL 10 MG/ML IV BOLUS
INTRAVENOUS | Status: DC | PRN
  Administered 2018-04-28: 150 mg via INTRAVENOUS
  Administered 2018-04-28: 50 mg via INTRAVENOUS

## 2018-04-28 MED ORDER — IBUPROFEN 800 MG PO TABS: 800.0000 mg | ORAL_TABLET | Freq: Three times a day (TID) | ORAL | Status: DC

## 2018-04-28 MED ORDER — INFLUENZA VAC SPLIT QUAD 0.5 ML IM SUSY
0.5000 mL | PREFILLED_SYRINGE | INTRAMUSCULAR | Status: AC
  Administered 2018-04-29: 0.5 mL via INTRAMUSCULAR
  Filled 2018-04-28: qty 0.5

## 2018-04-28 MED ORDER — CEFAZOLIN SODIUM-DEXTROSE 2-4 GM/100ML-% IV SOLN
INTRAVENOUS | Status: AC
  Filled 2018-04-28: qty 100

## 2018-04-28 MED ORDER — ROCURONIUM BROMIDE 100 MG/10ML IV SOLN
INTRAVENOUS | Status: DC | PRN
  Administered 2018-04-28: 40 mg via INTRAVENOUS

## 2018-04-28 MED ORDER — KETOROLAC TROMETHAMINE 30 MG/ML IJ SOLN
30.0000 mg | Freq: Four times a day (QID) | INTRAMUSCULAR | Status: DC
  Administered 2018-04-28 – 2018-04-29 (×2): 30 mg via INTRAVENOUS
  Filled 2018-04-28 (×2): qty 1

## 2018-04-28 MED ORDER — HYDROMORPHONE HCL 1 MG/ML IJ SOLN
0.2500 mg | INTRAMUSCULAR | Status: DC | PRN
  Administered 2018-04-28 (×3): 0.5 mg via INTRAVENOUS

## 2018-04-28 MED ORDER — FENTANYL CITRATE (PF) 100 MCG/2ML IJ SOLN
INTRAMUSCULAR | Status: DC | PRN
  Administered 2018-04-28: 50 ug via INTRAVENOUS
  Administered 2018-04-28: 100 ug via INTRAVENOUS
  Administered 2018-04-28: 50 ug via INTRAVENOUS

## 2018-04-28 MED ORDER — SIMETHICONE 80 MG PO CHEW: 80.0000 mg | CHEWABLE_TABLET | Freq: Four times a day (QID) | ORAL | Status: DC | PRN

## 2018-04-28 MED ORDER — FENTANYL CITRATE (PF) 250 MCG/5ML IJ SOLN
INTRAMUSCULAR | Status: AC
  Filled 2018-04-28: qty 5

## 2018-04-28 MED ORDER — ONDANSETRON HCL 4 MG PO TABS: 4.0000 mg | ORAL_TABLET | Freq: Four times a day (QID) | ORAL | Status: DC | PRN

## 2018-04-28 MED ORDER — KETOROLAC TROMETHAMINE 30 MG/ML IJ SOLN
INTRAMUSCULAR | Status: DC | PRN
  Administered 2018-04-28: 30 mg via INTRAVENOUS

## 2018-04-28 SURGICAL SUPPLY — 22 items
BLADE SURG 10 STRL SS (BLADE) ×3 IMPLANT
CANISTER SUCT 3000ML PPV (MISCELLANEOUS) ×3 IMPLANT
CONT PATH 16OZ SNAP LID 3702 (MISCELLANEOUS) ×2 IMPLANT
DECANTER SPIKE VIAL GLASS SM (MISCELLANEOUS) IMPLANT
GLOVE BIO SURGEON STRL SZ7.5 (GLOVE) ×3 IMPLANT
GLOVE BIO SURGEON STRL SZ8 (GLOVE) ×3 IMPLANT
GLOVE BIOGEL PI IND STRL 6.5 (GLOVE) ×1 IMPLANT
GLOVE BIOGEL PI IND STRL 7.0 (GLOVE) ×2 IMPLANT
GLOVE BIOGEL PI INDICATOR 6.5 (GLOVE) ×2
GLOVE BIOGEL PI INDICATOR 7.0 (GLOVE) ×4
GOWN STRL REUS W/TWL LRG LVL3 (GOWN DISPOSABLE) ×9 IMPLANT
GOWN STRL REUS W/TWL XL LVL3 (GOWN DISPOSABLE) ×3 IMPLANT
NS IRRIG 1000ML POUR BTL (IV SOLUTION) ×3 IMPLANT
PACK VAGINAL WOMENS (CUSTOM PROCEDURE TRAY) ×3 IMPLANT
PAD OB MATERNITY 4.3X12.25 (PERSONAL CARE ITEMS) ×3 IMPLANT
SUT VIC AB 2-0 CT1 18 (SUTURE) ×3 IMPLANT
SUT VIC AB 2-0 CT1 27 (SUTURE)
SUT VIC AB 2-0 CT1 TAPERPNT 27 (SUTURE) IMPLANT
SUT VIC AB PLUS 45CM 1-MO-4 (SUTURE) ×8 IMPLANT
SUT VICRYL 1 TIES 12X18 (SUTURE) ×3 IMPLANT
TOWEL OR 17X24 6PK STRL BLUE (TOWEL DISPOSABLE) ×6 IMPLANT
TRAY FOLEY W/BAG SLVR 14FR (SET/KITS/TRAYS/PACK) ×3 IMPLANT

## 2018-04-28 NOTE — Transfer of Care (Signed)
Immediate Anesthesia Transfer of Care Note  Patient: Chelsea Cannon  Procedure(s) Performed: HYSTERECTOMY VAGINAL WITH SALPINGECTOMY (Bilateral Vagina )  Patient Location: PACU  Anesthesia Type:General  Level of Consciousness: awake, alert  and oriented  Airway & Oxygen Therapy: Patient Spontanous Breathing and Patient connected to nasal cannula oxygen  Post-op Assessment: Report given to RN and Post -op Vital signs reviewed and stable  Post vital signs: Reviewed and stable  Last Vitals:  Vitals Value Taken Time  BP 136/75 04/28/2018  3:02 PM  Temp    Pulse 88 04/28/2018  3:09 PM  Resp 12 04/28/2018  3:09 PM  SpO2 100 % 04/28/2018  3:09 PM  Vitals shown include unvalidated device data.  Last Pain:  Vitals:   04/28/18 1201  TempSrc: Oral  PainSc: 4       Patients Stated Pain Goal: 3 (85/50/15 8682)  Complications: No apparent anesthesia complications

## 2018-04-28 NOTE — Op Note (Signed)
Chelsea Cannon PROCEDURE DATE: 04/28/2018  PREOPERATIVE DIAGNOSIS:  Symptomatic fibroids, menorrhagia POSTOPERATIVE DIAGNOSIS:  Symptomatic fibroids, menorrhagia SURGEON:   Chancy Milroy M.D., FACOG ASSISTANT: Sloan Leiter , M.D. OPERATION:  Total Vaginal hysterectomy with bilateral salpingectomy ANESTHESIA:  General endotracheal.  INDICATIONS: The patient is a 52 y.o. [id number] with history of symptomatic uterine fibroids/menorrhagia. The patient made a decision to undergo definite surgical treatment. On the preoperative visit, the risks, benefits, indications, and alternatives of the procedure were reviewed with the patient.  On the day of surgery, the risks of surgery were again discussed with the patient including but not limited to: bleeding which may require transfusion or reoperation; infection which may require antibiotics; injury to bowel, bladder, ureters or other surrounding organs; need for additional procedures; thromboembolic phenomenon, incisional problems and other postoperative/anesthesia complications. Written informed consent was obtained.    OPERATIVE FINDINGS: A 10 week size uterus with uterine fibroids, normal tubes and ovaries bilaterally.  ESTIMATED BLOOD LOSS: 175 ml FLUIDS:  As recorded  URINE OUTPUT:  As recorded SPECIMENS:  Uterus and cervix and bilateral tubes sent to pathology COMPLICATIONS:  None immediate.  DESCRIPTION OF PROCEDURE:  The patient received intravenous antibiotics and had sequential compression devices applied to her lower extremities while in the preoperative area.  She was then taken to the operating room where general anesthesia was administered and was found to be adequate.  She was placed in the dorsal lithotomy position, and was prepped and draped in a sterile manner.  The patients bladder was drained with a  Catheter, which was left in place. After an adequate timeout was performed, attention was turned to her pelvis.  A weighted speculum  was then placed in the vagina, and the anterior and posterior lips of the cervix were grasped bilaterally with tenaculums.  The posterior cul de sac was entered sharply. A long bill weighted speculum was placed.  The cervix was then circumferentially incised, and the bladder was dissected off the pubocervical fascia without complication. Entry was gained anteriorly. Deaver was replaced. Zeplin clamps were then used to clamp the uterosacral ligaments on either side.  They were then cut and sutured ligated with 0 Vicryl. Of note, all sutures used in this case were 0 Vicryl unless otherwise noted.   The cardinal ligaments were then clamped, cut and ligated. The uterine vessels and broad ligaments were then serially clamped with Zeplin clamps, cut, and suture ligated on both sides.  Excellent hemostasis was noted at this point.  Due to the size of the uterus, it was morcellated using a bivalve  technique.  The uterus was then delivered via the posterior cul-de-sac, and the cornua were clamped with the Zeplin clamps, transected, and the uterus was delivered and sent to pathology. These pedicles were then suture ligated to ensure hemostasis.  After completion of the hysterectomy, The fallopian tubes on each side were grasped with a Kelly clamp, transected and suture ligated x 2 with a free tie.  All pedicles from the uterosacral ligament to the cornua were examined hemostasis was confirmed. The perineum was closed in purse string fashion with 2/0 Vicryl. The vaginal cuff was closed with 3/0 Vicryl    All instruments were then removed from the pelvis and a vaginal packing saturated with estrogen cream was placed.  The patient tolerated the procedure well.  All instruments, needles, and sponge counts were correct x 2. The patient was taken to the recovery room in stable condition.   An experienced assistant  was required given the standard of surgical care given the complexity of the case.  This assistant was needed for  exposure, dissection, suctioning, retraction,  and for overall help during the procedure.

## 2018-04-28 NOTE — Anesthesia Procedure Notes (Signed)
Procedure Name: Intubation Date/Time: 04/28/2018 1:29 PM Performed by: Flossie Dibble, CRNA Pre-anesthesia Checklist: Patient identified, Patient being monitored, Timeout performed, Emergency Drugs available and Suction available Patient Re-evaluated:Patient Re-evaluated prior to induction Oxygen Delivery Method: Circle System Utilized Preoxygenation: Pre-oxygenation with 100% oxygen Induction Type: IV induction Ventilation: Oral airway inserted - appropriate to patient size and Mask ventilation with difficulty Laryngoscope Size: 3 and Glidescope (LoPro3) Grade View: Grade I Tube type: Oral Tube size: 7.0 mm Number of attempts: 1 Airway Equipment and Method: stylet and Video-laryngoscopy Placement Confirmation: ETT inserted through vocal cords under direct vision,  positive ETCO2 and breath sounds checked- equal and bilateral Secured at: 22 cm Tube secured with: Tape Dental Injury: Teeth and Oropharynx as per pre-operative assessment  Difficulty Due To: Difficulty was anticipated, Difficult Airway- due to anterior larynx and Difficult Airway- due to limited oral opening

## 2018-04-28 NOTE — Anesthesia Preprocedure Evaluation (Addendum)
Anesthesia Evaluation  Patient identified by MRN, date of birth, ID band Patient awake    Reviewed: Allergy & Precautions, NPO status , Patient's Chart, lab work & pertinent test results  Airway Mallampati: I  TM Distance: >3 FB Neck ROM: Full    Dental  (+) Chipped, Teeth Intact,    Pulmonary    Pulmonary exam normal breath sounds clear to auscultation       Cardiovascular Exercise Tolerance: Good negative cardio ROS Normal cardiovascular exam Rhythm:Regular Rate:Normal     Neuro/Psych negative neurological ROS  negative psych ROS   GI/Hepatic Neg liver ROS, GERD  Medicated,  Endo/Other  negative endocrine ROS  Renal/GU negative Renal ROS  negative genitourinary   Musculoskeletal negative musculoskeletal ROS (+)   Abdominal Normal abdominal exam  (+)   Peds negative pediatric ROS (+)  Hematology  (+) anemia ,   Anesthesia Other Findings   Reproductive/Obstetrics negative OB ROS                             Lab Results  Component Value Date   WBC 9.5 04/14/2018   HGB 12.8 04/14/2018   HCT 40.1 04/14/2018   MCV 80.5 04/14/2018   PLT 412 (H) 04/14/2018    Anesthesia Physical  Anesthesia Plan  ASA: II  Anesthesia Plan: General   Post-op Pain Management:    Induction: Intravenous  PONV Risk Score and Plan: 4 or greater and Treatment may vary due to age or medical condition, Ondansetron, Dexamethasone, Scopolamine patch - Pre-op and Midazolam  Airway Management Planned: LMA, Oral ETT and Video Laryngoscope Planned  Additional Equipment:   Intra-op Plan:   Post-operative Plan: Extubation in OR  Informed Consent: I have reviewed the patients History and Physical, chart, labs and discussed the procedure including the risks, benefits and alternatives for the proposed anesthesia with the patient or authorized representative who has indicated his/her understanding and  acceptance.   Dental advisory given  Plan Discussed with: CRNA and Surgeon  Anesthesia Plan Comments:       Anesthesia Quick Evaluation

## 2018-04-28 NOTE — Interval H&P Note (Signed)
History and Physical Interval Note:  04/28/2018 12:54 PM  Chelsea Cannon  has presented today for surgery, with the diagnosis of Uterine Fibroids AUB  The various methods of treatment have been discussed with the patient and family. After consideration of risks, benefits and other options for treatment, the patient has consented to  Procedure(s): HYSTERECTOMY VAGINAL WITH SALPINGECTOMY (Bilateral) as a surgical intervention .  The patient's history has been reviewed, patient examined, no change in status, stable for surgery.  I have reviewed the patient's chart and labs.  Questions were answered to the patient's satisfaction.     Chancy Milroy

## 2018-04-29 LAB — BASIC METABOLIC PANEL
Anion gap: 10 (ref 5–15)
BUN: 9 mg/dL (ref 6–20)
CALCIUM: 8.6 mg/dL — AB (ref 8.9–10.3)
CO2: 24 mmol/L (ref 22–32)
Chloride: 104 mmol/L (ref 98–111)
Creatinine, Ser: 0.65 mg/dL (ref 0.44–1.00)
GFR calc Af Amer: >60 mL/min (ref >60)
GFR calc non Af Amer: >60 mL/min (ref >60)
Glucose, Bld: 121 mg/dL — ABNORMAL HIGH (ref 70–99)
POTASSIUM: 3.5 mmol/L (ref 3.5–5.1)
Sodium: 138 mmol/L (ref 135–145)

## 2018-04-29 LAB — CBC
HEMATOCRIT: 35.4 % — AB (ref 36.0–46.0)
Hemoglobin: 11.3 g/dL — ABNORMAL LOW (ref 12.0–15.0)
MCH: 26.2 pg (ref 26.0–34.0)
MCHC: 31.9 g/dL (ref 30.0–36.0)
MCV: 82.1 fL (ref 80.0–100.0)
NRBC: 0 % (ref 0.0–0.2)
Platelets: 351 10*3/uL (ref 150–400)
RBC: 4.31 MIL/uL (ref 3.87–5.11)
RDW: 16.4 % — AB (ref 11.5–15.5)
WBC: 17.2 10*3/uL — ABNORMAL HIGH (ref 4.0–10.5)

## 2018-04-29 MED ORDER — OXYCODONE-ACETAMINOPHEN 5-325 MG PO TABS: 1.0000 | ORAL_TABLET | ORAL | 0 refills | Status: DC | PRN

## 2018-04-29 MED ORDER — IBUPROFEN 800 MG PO TABS: 800.0000 mg | ORAL_TABLET | Freq: Three times a day (TID) | ORAL | 0 refills | Status: AC

## 2018-04-29 NOTE — Anesthesia Postprocedure Evaluation (Signed)
Anesthesia Post Note  Patient: Chelsea Cannon  Procedure(s) Performed: HYSTERECTOMY VAGINAL WITH SALPINGECTOMY (Bilateral Vagina )     Patient location during evaluation: [facility] Anesthesia Type: General Level of consciousness: awake, awake and alert and oriented Pain management: pain level controlled Vital Signs Assessment: post-procedure vital signs reviewed and stable Respiratory status: spontaneous breathing, nonlabored ventilation and respiratory function stable Cardiovascular status: stable Postop Assessment: no headache, no backache, patient able to bend at knees, no apparent nausea or vomiting, adequate PO intake and able to ambulate Anesthetic complications: no    Last Vitals:  Vitals:   04/29/18 0542 04/29/18 0747  BP: (!) 95/55 112/65  Pulse: 70 71  Resp: 17 18  Temp: 37.2 C 36.8 C  SpO2: 98% 100%    Last Pain:  Vitals:   04/29/18 0747  TempSrc: Oral  PainSc:    Pain Goal: Patients Stated Pain Goal: 2 (04/29/18 0730)               [name] Current

## 2018-04-29 NOTE — Discharge Summary (Signed)
Physician Discharge Summary  Patient ID: Chelsea Cannon MRN: [id number] DOB/AGE: 1966/04/04 52 y.o.  Admit date: 04/28/2018 Discharge date: 04/29/2018  Admission Diagnoses: Menorrhagia and uterine fibroids  Discharge Diagnoses:  Active Problems:   Post-operative state   Discharged Condition: good  Hospital Course: Chelsea Cannon was admitted with above Dx. Underwent TVH/BS on 04/29/18 without problems. See OP note for additional information. Post op course was unremarkable. Progressed to ambulating, voiding, tolerating diet, and good oral pain control. Felt amendable for discharge home. Discharge instructions, medications and follow up reviewed with pt. Pt verbalized understand.  Consults: None  Significant Diagnostic Studies: labs  Treatments: surgery: TVH/BS  Discharge Exam: Blood pressure 111/66, pulse 73, temperature 99.2 F (37.3 C), temperature source Oral, resp. rate 18, height 5\' 4"  (1.626 m), weight 76.4 kg, last menstrual period 03/26/2018, SpO2 97 %.  Lungs clear Heart RRR Abd soft + BS  GU no bleeding Ext non tender  Disposition: Discharge disposition: 01-Home or Self Care       Discharge Instructions    Call MD for:  difficulty breathing, headache or visual disturbances   Complete by:  As directed    Call MD for:  extreme fatigue   Complete by:  As directed    Call MD for:  persistant dizziness or light-headedness   Complete by:  As directed    Call MD for:  persistant nausea and vomiting   Complete by:  As directed    Call MD for:  redness, tenderness, or signs of infection (pain, swelling, redness, odor or green/yellow discharge around incision site)   Complete by:  As directed    Call MD for:  severe uncontrolled pain   Complete by:  As directed    Call MD for:  temperature >100.4   Complete by:  As directed    Diet - low sodium heart healthy   Complete by:  As directed    Increase activity slowly   Complete by:  As directed    Sexual Activity  Restrictions   Complete by:  As directed    Pelvic rest x 6 weeks.     Allergies as of 04/29/2018      Reactions   Ciprofloxacin Other (See Comments)   Hepatitis/ elevated LFTs      Medication List    STOP taking these medications   ALEVE PM 220-25 MG Tabs Generic drug:  Naproxen Sod-diphenhydrAMINE   naproxen sodium 220 MG tablet Commonly known as:  ALEVE   tranexamic acid 650 MG Tabs tablet Commonly known as:  LYSTEDA     TAKE these medications   famotidine 10 MG tablet Commonly known as:  PEPCID Take 10 mg by mouth as needed for heartburn or indigestion.   ibuprofen 800 MG tablet Commonly known as:  ADVIL,MOTRIN Take 1 tablet (800 mg total) by mouth every 8 (eight) hours.   oxyCODONE-acetaminophen 5-325 MG tablet Commonly known as:  PERCOCET/ROXICET Take 1 tablet by mouth every 4 (four) hours as needed for moderate pain ((when tolerating fluids)).      Follow-up Information    [facility]. Schedule an appointment as soon as possible for a visit in 4 week(s).   Why:  Post op appt with Dr. Gardiner Fanti Contact information: [address] [place] [address] [phone]          Signed: Chancy Milroy 04/29/2018, 12:46 PM

## 2018-04-29 NOTE — Discharge Instructions (Signed)
Vaginal Hysterectomy, Care After °Refer to this sheet in the next few weeks. These instructions provide you with information about caring for yourself after your procedure. Your health care provider may also give you more specific instructions. Your treatment has been planned according to current medical practices, but problems sometimes occur. Call your health care provider if you have any problems or questions after your procedure. °What can I expect after the procedure? °After the procedure, it is common to have: °· Pain. °· Soreness and numbness in your incision areas. °· Vaginal bleeding and discharge. °· Constipation. °· Temporary problems emptying the bladder. °· Feelings of sadness or other emotions. ° °Follow these instructions at home: °Medicines °· Take over-the-counter and prescription medicines only as told by your health care provider. °· If you were prescribed an antibiotic medicine, take it as told by your health care provider. Do not stop taking the antibiotic even if you start to feel better. °· Do not drive or operate heavy machinery while taking prescription pain medicine. °Activity °· Return to your normal activities as told by your health care provider. Ask your health care provider what activities are safe for you. °· Get regular exercise as told by your health care provider. You may be told to take short walks every day and go farther each time. °· Do not lift anything that is heavier than 10 lb (4.5 kg). °General instructions ° °· Do not put anything in your vagina for 6 weeks after your surgery or as told by your health care provider. This includes tampons and douches. °· Do not have sex until your health care provider says you can. °· Do not take baths, swim, or use a hot tub until your health care provider approves. °· Drink enough fluid to keep your urine clear or pale yellow. °· Do not drive for 24 hours if you were given a sedative. °· Keep all follow-up visits as told by your health  care provider. This is important. °Contact a health care provider if: °· Your pain medicine is not helping. °· You have a fever. °· You have redness, swelling, or pain at your incision site. °· You have blood, pus, or a bad-smelling discharge from your vagina. °· You continue to have difficulty urinating. °Get help right away if: °· You have severe abdominal or back pain. °· You have heavy bleeding from your vagina. °· You have chest pain or shortness of breath. °This information is not intended to replace advice given to you by your health care provider. Make sure you discuss any questions you have with your health care provider. °Document Released: 10/01/2015 Document Revised: 11/15/2015 Document Reviewed: 06/24/2015 °Elsevier Interactive Patient Education © 2018 [employer] ° °

## 2018-04-29 NOTE — Progress Notes (Signed)
Discharge teaching complete with pt. Pt understood all information and did not have any questions. Pt discharged home to family.

## 2018-04-30 ENCOUNTER — Encounter (HOSPITAL_COMMUNITY): Payer: Self-pay | Admitting: Obstetrics and Gynecology

## 2018-05-05 ENCOUNTER — Encounter (HOSPITAL_COMMUNITY): Payer: Self-pay

## 2018-05-19 ENCOUNTER — Encounter: Payer: Self-pay | Admitting: Obstetrics and Gynecology

## 2018-05-19 ENCOUNTER — Ambulatory Visit (INDEPENDENT_AMBULATORY_CARE_PROVIDER_SITE_OTHER): Payer: Self-pay | Admitting: Obstetrics and Gynecology

## 2018-05-19 VITALS — BP 148/81 | HR 101 | Wt 168.0 lb

## 2018-05-19 DIAGNOSIS — Z9889 Other specified postprocedural states: Secondary | ICD-10-CM

## 2018-05-19 NOTE — Progress Notes (Signed)
Patient ID: Chelsea Cannon, female   DOB: 04-14-1966, 52 y.o.   MRN: [id number] Here for post op visit S/P TVH/BS on 04/28/18 Pathology reviewed Overall doing well No bowel or bladder dysfunction Occ pelvic/vaginal pain. Motrin helps. No vaginal discharge or bleeding Feels tried and some dizziness  PE AF VSS Lungs clear Heart RRR Abd soft + BS GU cuff healing well no masses or tenderness  A/P  Post op TVH   Doing well. Return to ADL's as tolerates. Continue with pelvic rest. Will check CBC. Recommend OTC vitamin. See PCP for BP check and follow up F/U with me in 4 weeks

## 2018-05-19 NOTE — Progress Notes (Signed)
Pt states she is still having some pain, lower pelvis, vaginal. Pt states some dizziness- eating/drinking well.   BP slightly elevated today.

## 2018-05-20 LAB — CBC
HEMATOCRIT: 39.9 % (ref 34.0–46.6)
Hemoglobin: 13.6 g/dL (ref 11.1–15.9)
MCH: 27.2 pg (ref 26.6–33.0)
MCHC: 34.1 g/dL (ref 31.5–35.7)
MCV: 80 fL (ref 79–97)
Platelets: 488 10*3/uL — ABNORMAL HIGH (ref 150–450)
RBC: 5 x10E6/uL (ref 3.77–5.28)
RDW: 15.8 % — AB (ref 12.3–15.4)
WBC: 11.6 10*3/uL — AB (ref 3.4–10.8)

## 2018-06-04 ENCOUNTER — Telehealth: Payer: Self-pay

## 2018-06-04 NOTE — Telephone Encounter (Signed)
Spoke with pt and advised of results.

## 2018-06-14 ENCOUNTER — Encounter: Payer: Self-pay | Admitting: Obstetrics and Gynecology

## 2018-06-14 ENCOUNTER — Ambulatory Visit: Payer: Self-pay | Admitting: Obstetrics and Gynecology

## 2018-06-14 VITALS — BP 134/82 | HR 79 | Wt 170.0 lb

## 2018-06-14 DIAGNOSIS — Z9889 Other specified postprocedural states: Secondary | ICD-10-CM

## 2018-06-14 NOTE — Patient Instructions (Signed)

## 2018-06-14 NOTE — Progress Notes (Signed)
[name] is here for her second post op appt. Doing well. No bowels or bladder dysfunction. Tolerating diet and nl ADL's Occ pain and occ fatigue.  PE AF VSS Lungs clear Heart RRR Abd soft + BS  Ext non tender  A/P Post OP appt Doing well. Continue with ADL's as tolerated. Resume sexual activity as tolerates F/U 1 yr or PRN

## 2019-02-15 IMAGING — DX DG CHEST 2V
2 series · 2 of 2 positions shown · non-contrast
Comparison: 03/20/2016

CLINICAL DATA: ERCP today with difficulty intubation. Shortness of
breath and wheezing upon awakening. Nonsmoker.

EXAM:
CHEST  2 VIEW

[chest pa]
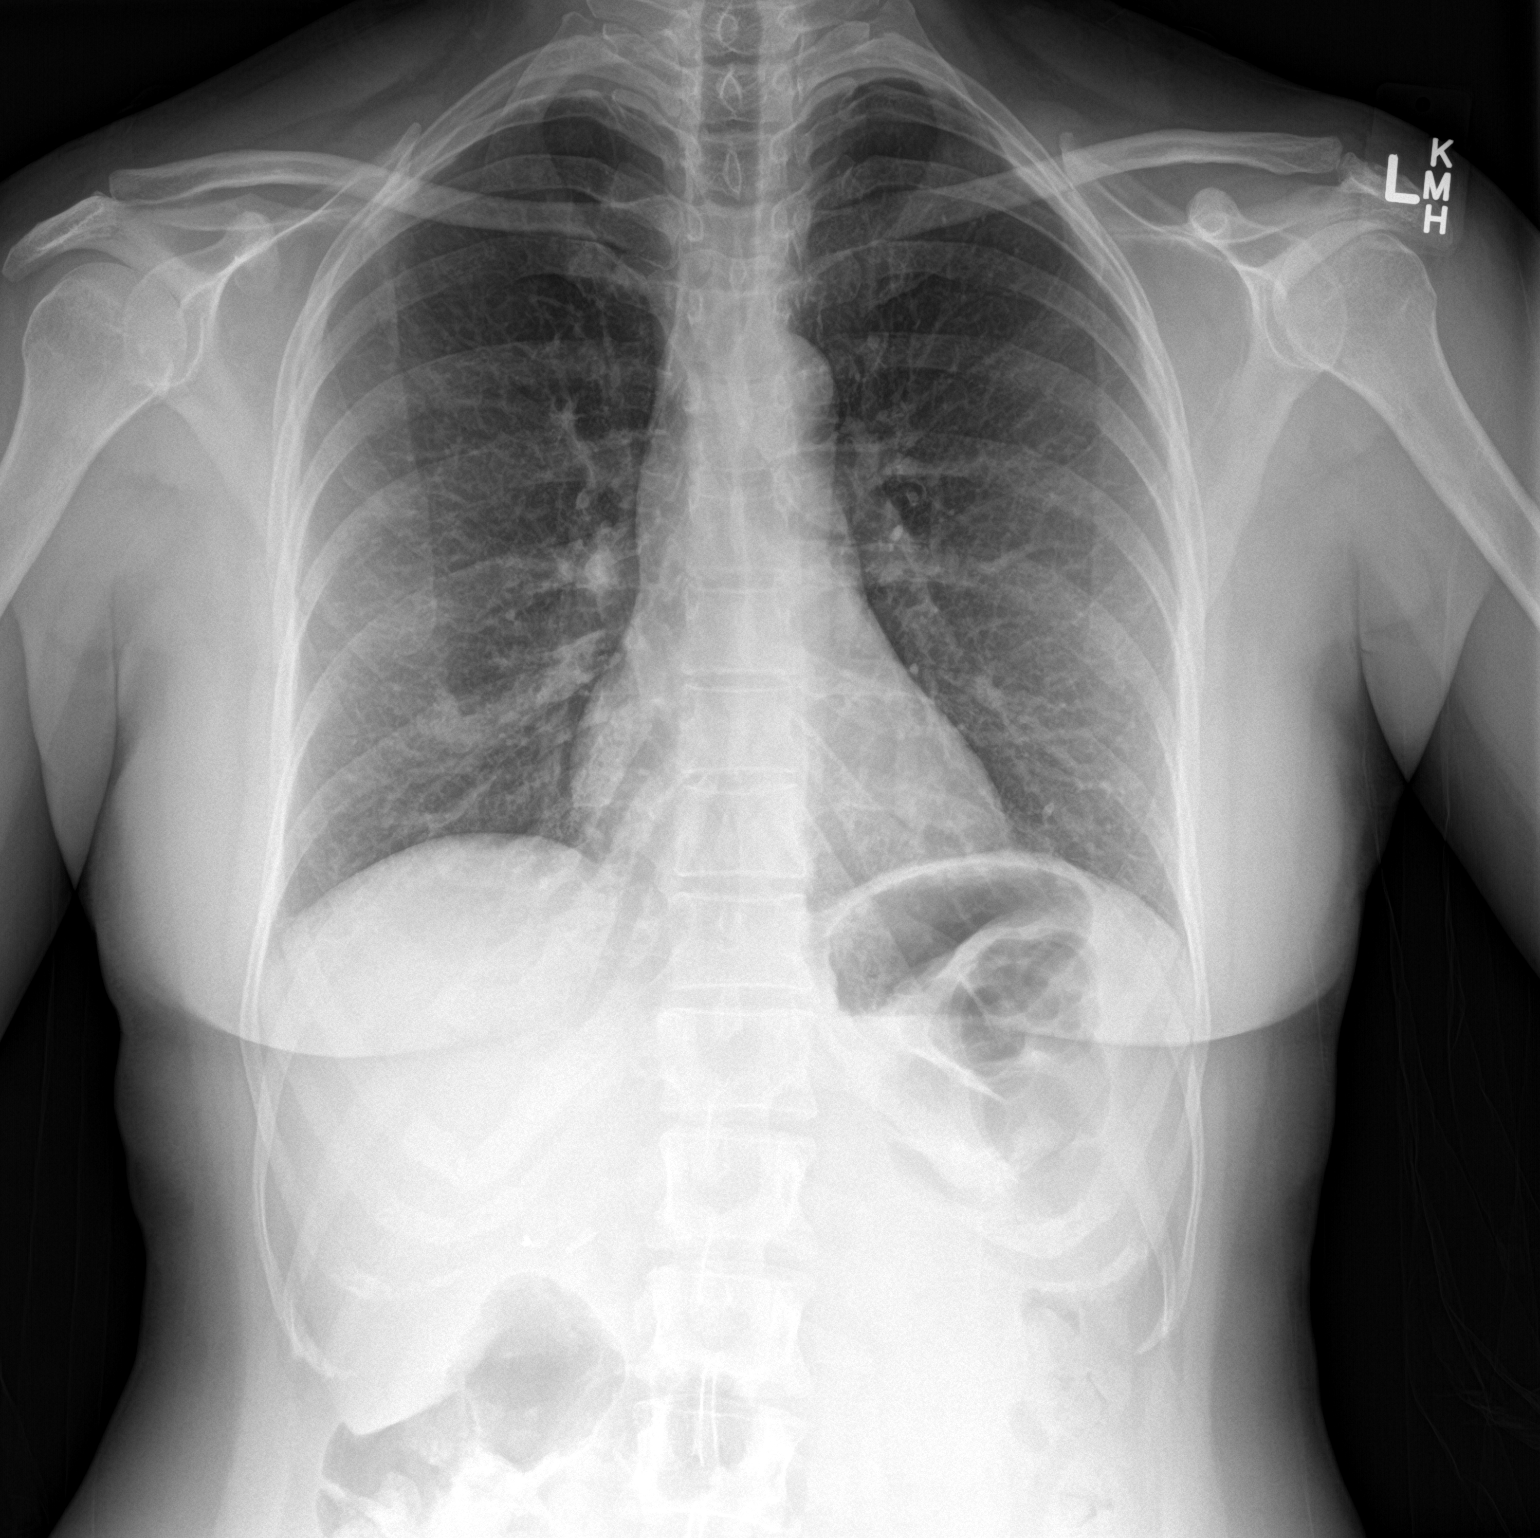

[chest lat]
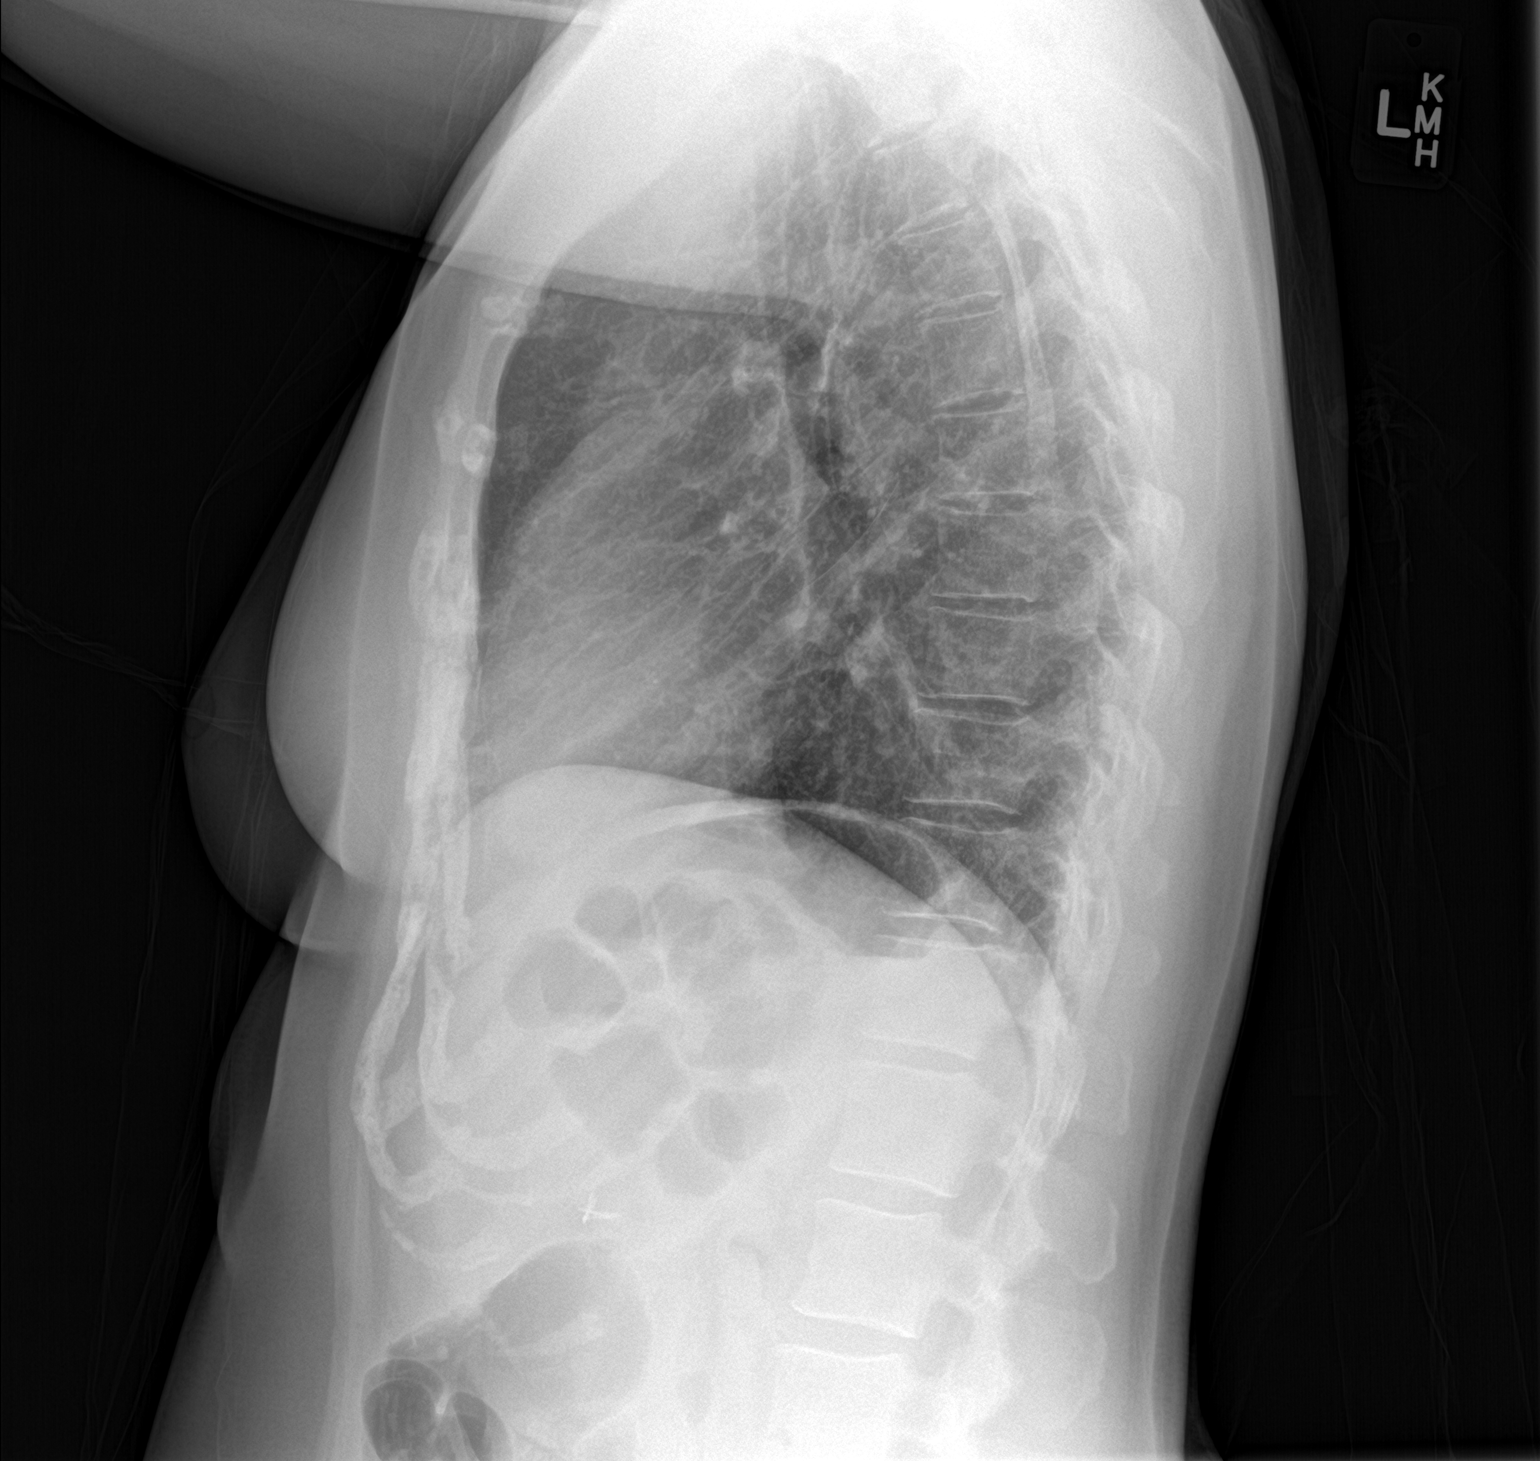

[2 of 2 positions shown; findings below may reference images not displayed]

FINDINGS: Normal heart size and pulmonary vascularity. No focal airspace
disease or consolidation in the lungs. No blunting of costophrenic
angles. No pneumothorax. Mediastinal contours appear intact.
Surgical clips in the right upper quadrant.
IMPRESSION: No active cardiopulmonary disease.

## 2019-02-15 IMAGING — RF DG ERCP WO/W SPHINCTEROTOMY
1 series · 1 of 1 positions shown · non-contrast
Comparison: 08/08/2016 and previous

CLINICAL DATA: Common bile duct stone

EXAM:
ERCP
TECHNIQUE: Multiple spot images obtained with the fluoroscopic device and
submitted for interpretation post-procedure.
FLUOROSCOPY TIME:  3 minutes 29 seconds 62 mGy

[Series 1: run · 1 of 1 slices shown]
[im 1/1]
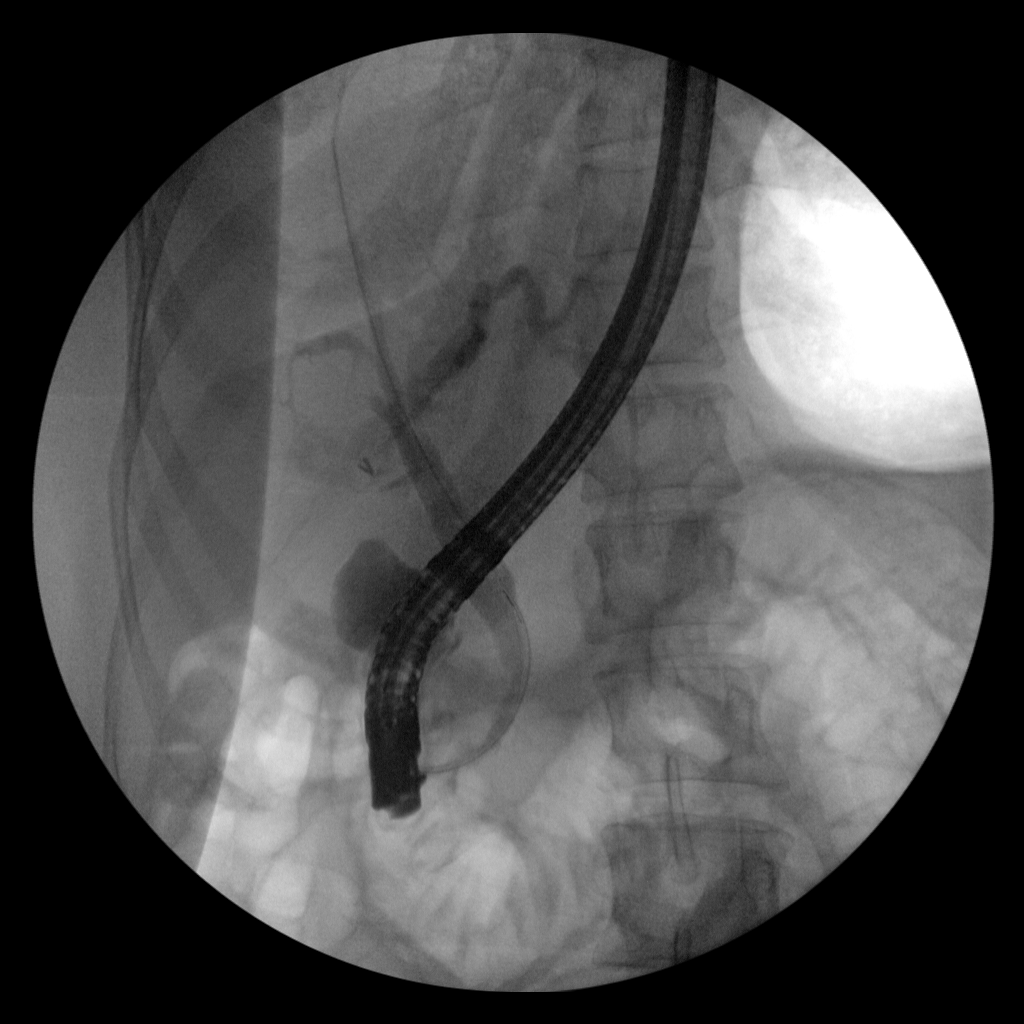

[1 of 1 positions shown; findings below may reference images not displayed]

FINDINGS: A single fluoroscopic spot image documents endoscopic cannulation
and opacification of the CBD with guidewire placement.
Cholecystectomy clips. Incomplete opacification of the intrahepatic
biliary tree which appears decompressed centrally.
IMPRESSION: 1. Endoscopic CBD opacification and intervention.
These images were submitted for radiologic interpretation only.
Please see the procedural report for the amount of contrast and the
fluoroscopy time utilized.

## 2019-09-09 ENCOUNTER — Ambulatory Visit: Payer: Self-pay

## 2019-09-24 ENCOUNTER — Ambulatory Visit: Payer: Self-pay | Attending: Internal Medicine

## 2019-09-24 DIAGNOSIS — Z23 Encounter for immunization: Secondary | ICD-10-CM

## 2019-09-24 NOTE — Progress Notes (Signed)
   Covid-19 Vaccination Clinic  Name:  Chelsea Cannon    MRN: [id number] DOB: 1965/07/07  09/24/2019  Chelsea Cannon was observed post Covid-19 immunization for 15 minutes without incident. She was provided with Vaccine Information Sheet and instruction to access the V-Safe system.   Chelsea Cannon was instructed to call 911 with any severe reactions post vaccine: Marland Kitchen Difficulty breathing  . Swelling of face and throat  . A fast heartbeat  . A bad rash all over body  . Dizziness and weakness   Immunizations Administered    Name Date Dose VIS Date Route   Pfizer COVID-19 Vaccine 09/24/2019 10:45 AM 0.3 mL 06/03/2019 Intramuscular   Manufacturer: [employer]   Lot: [id number]   [place]: [id number]

## 2019-10-15 ENCOUNTER — Ambulatory Visit: Payer: Self-pay | Attending: Internal Medicine

## 2019-10-15 DIAGNOSIS — Z23 Encounter for immunization: Secondary | ICD-10-CM

## 2019-10-15 NOTE — Progress Notes (Signed)
   Covid-19 Vaccination Clinic  Name:  Chelsea Cannon    MRN: [id number] DOB: [birth year]  10/15/2019  [name] was observed post Covid-19 immunization for 15 minutes without incident. She was provided with Vaccine Information Sheet and instruction to access the V-Safe system.   [name] was instructed to call 911 with any severe reactions post vaccine: Marland Kitchen Difficulty breathing  . Swelling of face and throat  . A fast heartbeat  . A bad rash all over body  . Dizziness and weakness   Immunizations Administered    Name Date Dose VIS Date Route   Pfizer COVID-19 Vaccine 10/15/2019  4:53 PM 0.3 mL 08/17/2018 Intramuscular   Manufacturer: [place]   Lot: [id number]   [place]: [id number]

## 2019-10-18 ENCOUNTER — Ambulatory Visit: Payer: Self-pay

## 2020-05-09 IMAGING — US US PELVIS COMPLETE TRANSABD/TRANSVAG
1 series · 15 of 25 positions shown · non-contrast
Comparison: None

CLINICAL DATA: Patient with menorrhagia. History of endocervical
polyp.



[Series 1: us pelvis complete transabd/transvag · 15 of 63 slices shown]
[im 1/63]
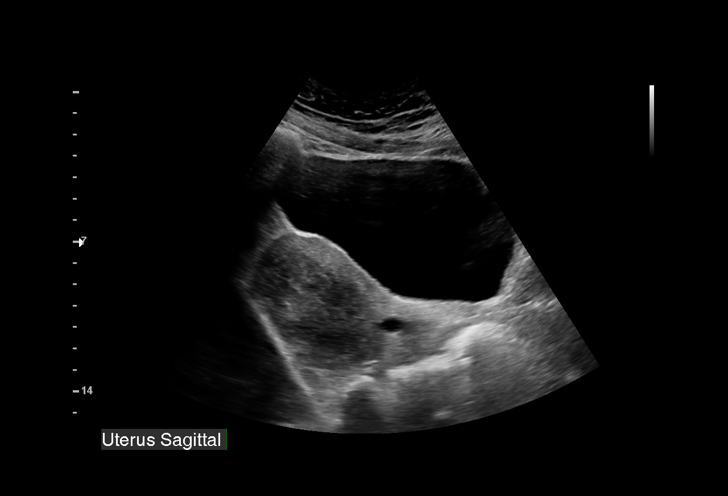
[im 6/63]
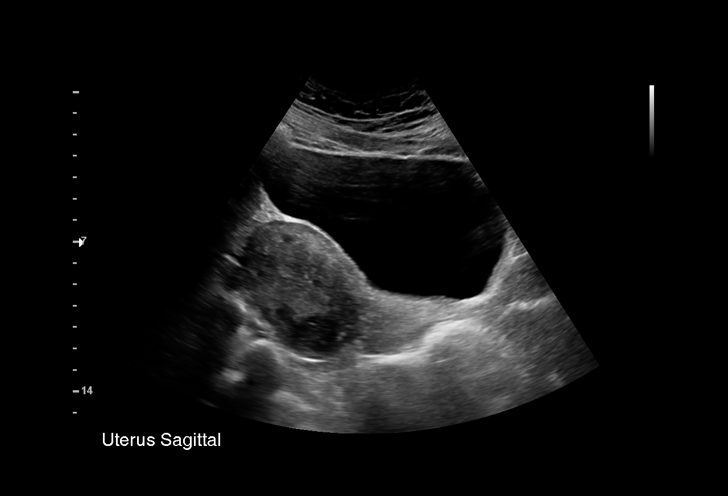
[im 11/63]
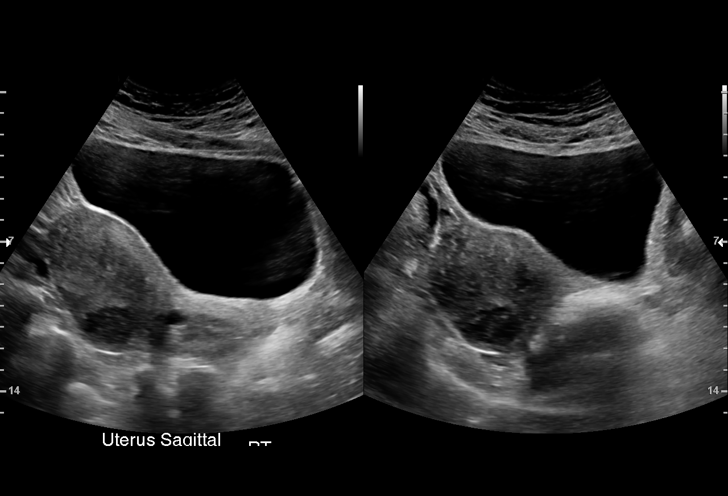
[im 13/63]
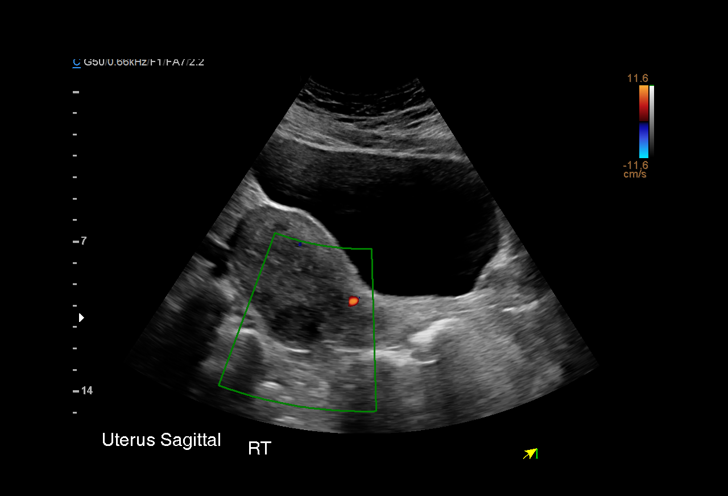
[im 19/63]
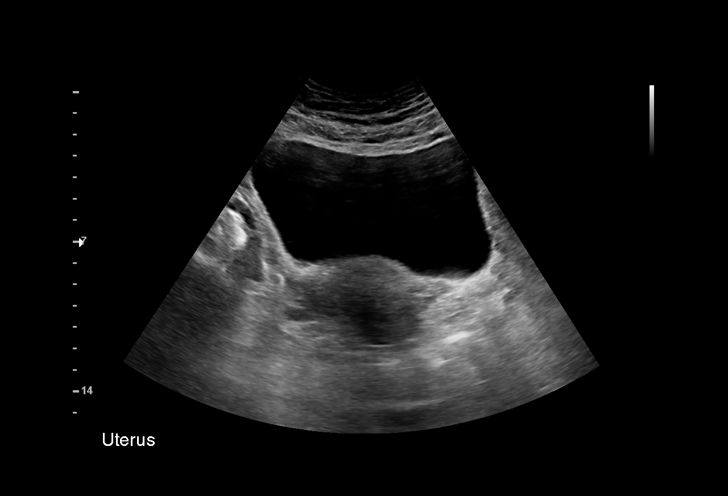
[im 24/63]
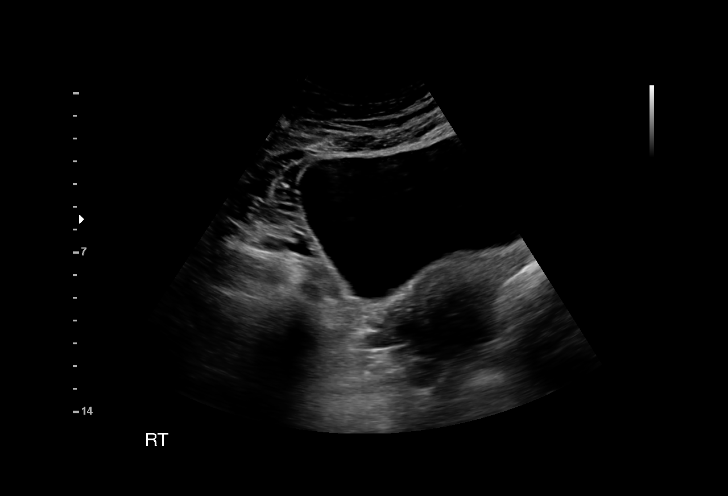
[im 26/63]
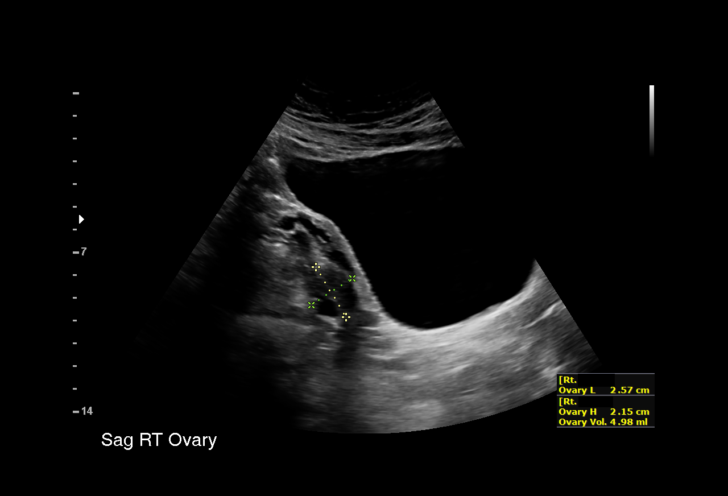
[im 32/63]
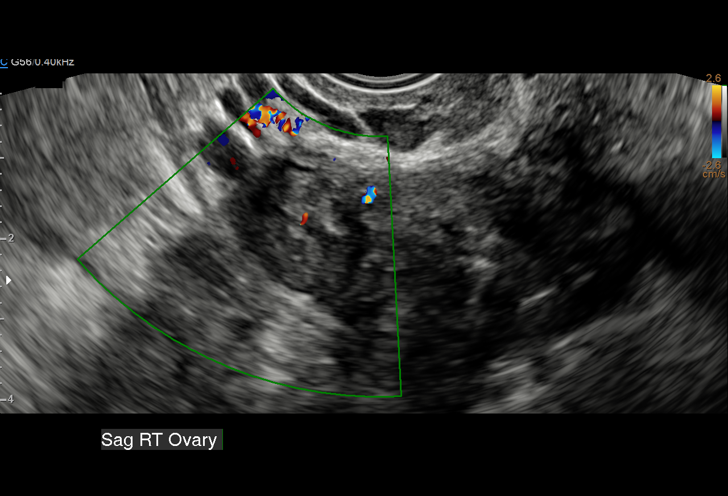
[im 37/63]
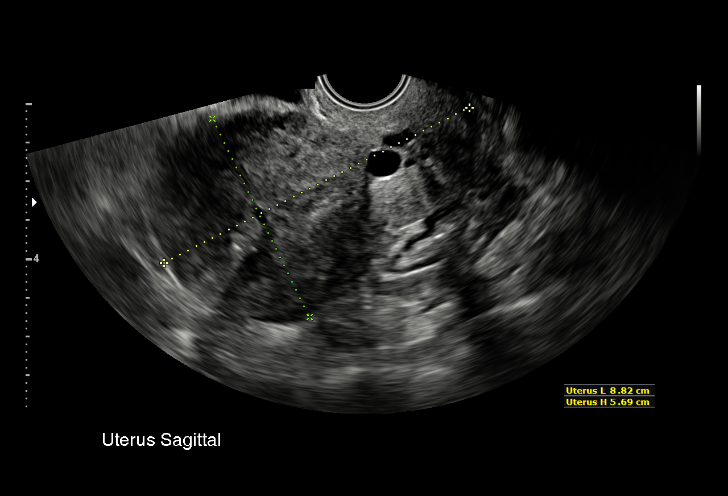
[im 39/63]
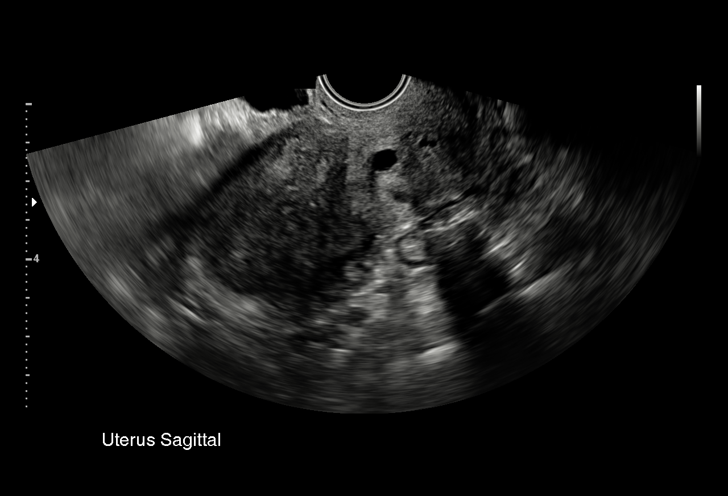
[im 44/63]
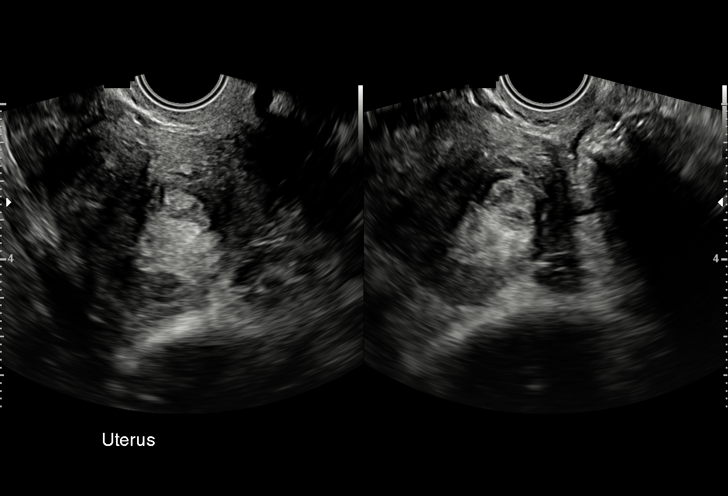
[im 50/63]
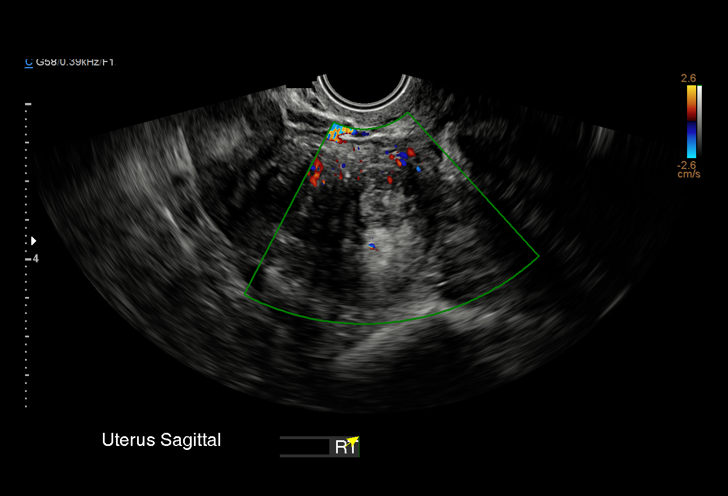
[im 52/63]
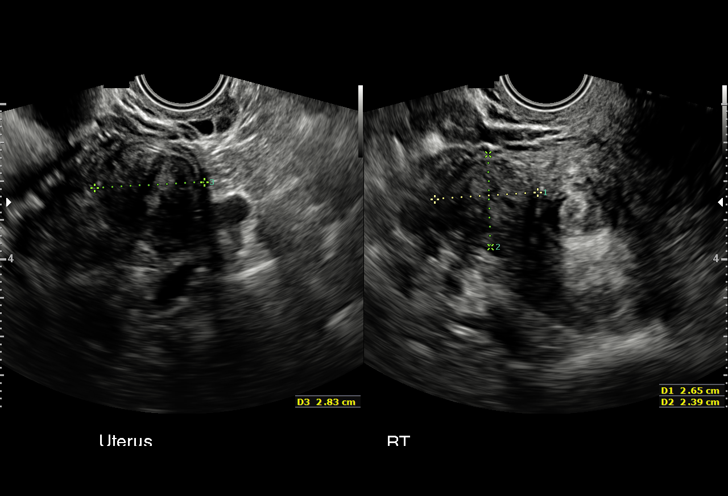
[im 57/63]
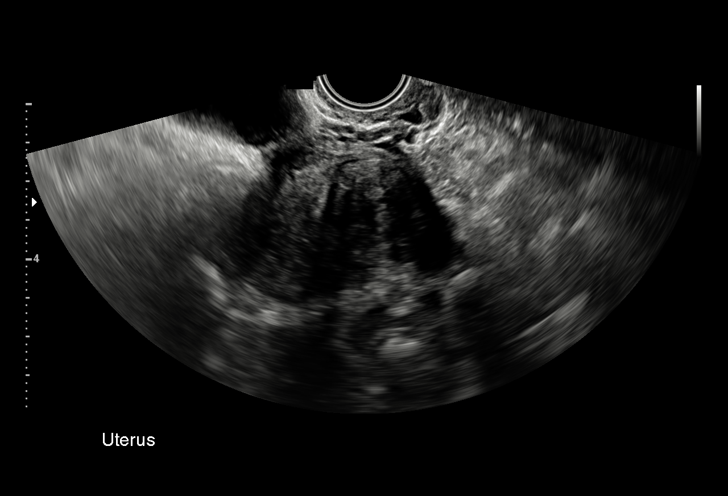
[im 63/63]
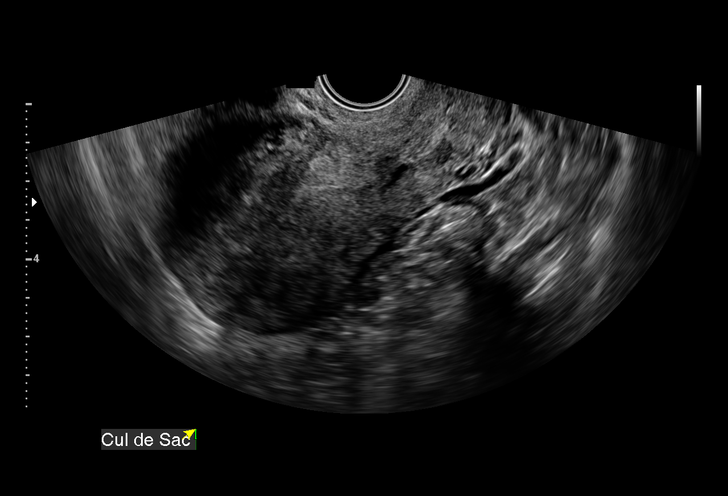

[15 of 25 positions shown; findings below may reference images not displayed]

FINDINGS: Uterus

Measurements: 8.8 x 5.7 x 6.1 cm. Within the posterior right aspect
of the uterine body there is a 3.2 x 2.2 x 2.5 cm echogenic mass,
potentially representing a lipoleiomyoma. There is an adjacent 1.8 x
1.8 x 1.6 cm intramural fibroid. There is an adjacent 2.7 x 2.4 x
2.8 cm intramural fibroid.

Endometrium

Poorly visualized.

Right ovary

Measurements: 1.8 x 1.6 x 1.1 cm. Normal appearance/no adnexal mass.

Left ovary

Measurements: 2.4 x 1.2 x 1.8 cm. Normal appearance/no adnexal mass.

Other findings

No abnormal free fluid.
IMPRESSION: 1. Endometrium is poorly visualized due to fibroids.
2. There is an echogenic mass within the posterior right aspect of
the uterine body which is favored represent a lipoleiomyoma.
3. Given history, and poor visualization of the endometrium,
consider further evaluation with pelvic MRI as clinically warranted.

## 2021-08-09 ENCOUNTER — Encounter: Payer: Self-pay | Admitting: Obstetrics and Gynecology

## 2022-04-02 ENCOUNTER — Ambulatory Visit (INDEPENDENT_AMBULATORY_CARE_PROVIDER_SITE_OTHER): Payer: Self-pay | Admitting: Orthopaedic Surgery

## 2022-04-02 ENCOUNTER — Ambulatory Visit (INDEPENDENT_AMBULATORY_CARE_PROVIDER_SITE_OTHER): Payer: Self-pay

## 2022-04-02 DIAGNOSIS — G8929 Other chronic pain: Secondary | ICD-10-CM

## 2022-04-02 DIAGNOSIS — M25562 Pain in left knee: Secondary | ICD-10-CM

## 2022-04-02 MED ORDER — LIDOCAINE HCL 1 % IJ SOLN
4.0000 mL | INTRAMUSCULAR | Status: AC | PRN
  Administered 2022-04-02: 4 mL

## 2022-04-02 MED ORDER — TRIAMCINOLONE ACETONIDE 40 MG/ML IJ SUSP
80.0000 mg | INTRAMUSCULAR | Status: AC | PRN
  Administered 2022-04-02: 80 mg via INTRA_ARTICULAR

## 2022-04-02 NOTE — Progress Notes (Addendum)
Chief Complaint: Left knee pain     History of Present Illness:    Chelsea Cannon is a 56 y.o. female presents today with 5 weeks of left knee pain.  Does not have any specific known injury.  She has been wearing an over-the-counter brace which she says helps somewhat.  She has been using crutches to keep weight off of this as this was advised by her primary care doctor.  She states that the knee is locking up when she goes from sit to stand.  There is radiation to the posterior knee.  There is a catching feeling in the knee.  She has a hard time with walking any longer distances.  She is here today for further assessment.  Has not had any injections or physical therapy    Surgical History:   None  PMH/PSH/Family History/Social History/Meds/Allergies:    Past Medical History:  Diagnosis Date  . Anemia   . Asthma    IN THE PAST  . Complication of anesthesia    stridor after extubation  . Gallstones   . GERD (gastroesophageal reflux disease)    occasional - tx w/ pepcid prn  . Jaundice   . Menorrhagia   . Poor dental hygiene    upper broken teeth  . Retained gallstones following laparoscopic cholecystectomy   . Stridor    AFTER WAKING UP FROM SURGERY  . SVD (spontaneous vaginal delivery)    x 4  . Wears contact lenses    Past Surgical History:  Procedure Laterality Date  . CHOLECYSTECTOMY N/A 03/22/2016   Procedure: LAPAROSCOPIC CHOLECYSTECTOMY WITH INTRAOPERATIVE CHOLANGIOGRAM;  Surgeon: Georganna Skeans, MD;  Location: [facility];  Service: General;  Laterality: N/A;  . DILATATION & CURETTAGE/HYSTEROSCOPY WITH MYOSURE N/A 10/01/2017   Procedure: DILATATION & CURETTAGE/HYSTEROSCOPY with ENDOCERVICAL POLYPECTOMY;  Surgeon: Chancy Milroy, MD;  Location: [facility];  Service: Gynecology;  Laterality: N/A;  myosure rep will be here Arielle confirmed on 09/08/17  . ERCP N/A 08/08/2016   Procedure: ENDOSCOPIC RETROGRADE CHOLANGIOPANCREATOGRAPHY  (ERCP);  Surgeon: Doran Stabler, MD;  Location: [facility];  Service: Endoscopy;  Laterality: N/A;  . ERCP N/A 09/03/2016   Procedure: ENDOSCOPIC RETROGRADE CHOLANGIOPANCREATOGRAPHY (ERCP);  Surgeon: Doran Stabler, MD;  Location: [facility];  Service: Gastroenterology;  Laterality: N/A;  . ERCP N/A 09/12/2016   Procedure: ENDOSCOPIC RETROGRADE CHOLANGIOPANCREATOGRAPHY (ERCP);  Surgeon: Irene Shipper, MD;  Location: [facility];  Service: Endoscopy;  Laterality: N/A;  . SPYGLASS CHOLANGIOSCOPY N/A 09/03/2016   Procedure: ALPFXTKW CHOLANGIOSCOPY;  Surgeon: Doran Stabler, MD;  Location: [facility];  Service: Gastroenterology;  Laterality: N/A;  . VAGINAL HYSTERECTOMY Bilateral 04/28/2018   Procedure: HYSTERECTOMY VAGINAL WITH SALPINGECTOMY;  Surgeon: Chancy Milroy, MD;  Location: [facility];  Service: Gynecology;  Laterality: Bilateral;   Social History   Socioeconomic History  . Marital status: Married    Spouse name: Not on file  . Number of children: 4  . Years of education: Not on file  . Highest education level: Not on file  Occupational History  . Occupation: unemployed  Tobacco Use  . Smoking status: Never  . Smokeless tobacco: Never  Vaping Use  . Vaping Use: Never used  Substance and Sexual Activity  . Alcohol use: No  . Drug use: No  .  Sexual activity: Yes    Partners: Male    Birth control/protection: None  Other Topics Concern  . Not on file  Social History Narrative  . Not on file   Social Determinants of Health   Financial Resource Strain: Not on file  Food Insecurity: Not on file  Transportation Needs: Not on file  Physical Activity: Not on file  Stress: Not on file  Social Connections: Not on file   Family History  Problem Relation Age of Onset  . Colon cancer Mother   . Diabetes Father   . Hypertension Father   . Cancer Father 80       Lung  . Heart disease Father   . Diabetes Sister   . Breast cancer Sister   . Breast cancer Paternal  Aunt    Allergies  Allergen Reactions  . Ciprofloxacin Other (See Comments)    Hepatitis/ elevated LFTs   Current Outpatient Medications  Medication Sig Dispense Refill  . famotidine (PEPCID) 10 MG tablet Take 10 mg by mouth as needed for heartburn or indigestion.    Marland Kitchen ibuprofen (ADVIL,MOTRIN) 800 MG tablet Take 1 tablet (800 mg total) by mouth every 8 (eight) hours. 30 tablet 0   No current facility-administered medications for this visit.   No results found.  Review of Systems:   A ROS was performed including pertinent positives and negatives as documented in the HPI.  Physical Exam :   Constitutional: NAD and appears stated age Neurological: Alert and oriented Psych: Appropriate affect and cooperative There were no vitals taken for this visit.   Comprehensive Musculoskeletal Exam:      Musculoskeletal Exam  Gait antalgic  Alignment Normal   Right Left  Inspection Normal Normal  Palpation    Tenderness None Medial lateral joint  Crepitus None None  Effusion none positive  Range of Motion    Extension 0 0  Flexion 135 90 with pain  Strength    Extension 5/5 5/5  Flexion 5/5 5/5  Ligament Exam     Generalized Laxity No No  Lachman Negative Negative   Pivot Shift Negative Negative  Anterior Drawer Negative Negative  Valgus at 0 Negative Negative  Valgus at 20 Negative Negative  Varus at 0 0 0  Varus at 20   0 0  Posterior Drawer at 90 0 0  Vascular/Lymphatic Exam    Edema None None  Venous Stasis Changes No No  Distal Circulation Normal Normal  Neurologic    Light Touch Sensation Intact Intact  Special Tests: Positive McMurray medially and laterally     Imaging:   Xray (4 views left knee): Normal   I personally reviewed and interpreted the radiographs.   Assessment:   56 y.o. female with left knee pain and swelling.  I did discuss the differential diagnosis including fat pad irritation versus meniscal tear.  This time we will plan to begin with  an intra-articular ultrasound-guided injection of the left knee.  I will plan to see her back in 6 weeks.  She has no improvement at that time we will consider an MRI or advanced imaging.  Also given her home program that she can work on to begin strengthening the knee.  Plan :    -Return to clinic 6 weeks    Procedure Note  Patient: Chelsea Cannon             Date of Birth: [birth year]           MRN: [id number]  Visit Date: 04/02/2022  Procedures: Visit Diagnoses:  1. Chronic pain of left knee     Large Joint Inj: L knee on 04/02/2022 11:23 AM Indications: pain Details: 22 G 1.5 in needle, ultrasound-guided anterior approach  Arthrogram: No  Medications: 4 mL lidocaine 1 %; 80 mg triamcinolone acetonide 40 MG/ML Outcome: tolerated well, no immediate complications Procedure, treatment alternatives, risks and benefits explained, specific risks discussed. Consent was given by the patient. Immediately prior to procedure a time out was called to verify the correct patient, procedure, equipment, support staff and site/side marked as required. Patient was prepped and draped in the usual sterile fashion.        I personally saw and evaluated the patient, and participated in the management and treatment plan.  Vanetta Mulders, MD Attending Physician, Orthopedic Surgery  This document was dictated using Dragon voice recognition software. A reasonable attempt at proof reading has been made to minimize errors.

## 2022-05-14 ENCOUNTER — Ambulatory Visit (HOSPITAL_BASED_OUTPATIENT_CLINIC_OR_DEPARTMENT_OTHER): Payer: Self-pay | Admitting: Orthopaedic Surgery
